# Patient Record
Sex: Male | Born: 1990 | Hispanic: No | Marital: Single | Smoking: Current every day smoker
Health system: Southern US, Community
[De-identification: ages and names within clinical notes are randomized; demographics above are authoritative.]

## PROBLEM LIST (undated history)

## (undated) DIAGNOSIS — F209 Schizophrenia, unspecified: Secondary | ICD-10-CM

## (undated) HISTORY — PX: FACIAL COSMETIC SURGERY: SHX629

---

## 2019-09-15 ENCOUNTER — Other Ambulatory Visit: Payer: Self-pay

## 2019-09-15 ENCOUNTER — Other Ambulatory Visit: Payer: Self-pay | Admitting: *Deleted

## 2019-09-15 ENCOUNTER — Ambulatory Visit: Payer: Self-pay

## 2019-09-15 DIAGNOSIS — B20 Human immunodeficiency virus [HIV] disease: Secondary | ICD-10-CM

## 2019-09-15 DIAGNOSIS — Z79899 Other long term (current) drug therapy: Secondary | ICD-10-CM

## 2019-09-15 DIAGNOSIS — Z113 Encounter for screening for infections with a predominantly sexual mode of transmission: Secondary | ICD-10-CM

## 2019-09-21 ENCOUNTER — Other Ambulatory Visit: Payer: Self-pay

## 2019-09-21 ENCOUNTER — Ambulatory Visit: Payer: Self-pay

## 2019-09-21 ENCOUNTER — Encounter: Payer: Self-pay | Admitting: Infectious Disease

## 2019-09-21 DIAGNOSIS — B20 Human immunodeficiency virus [HIV] disease: Secondary | ICD-10-CM

## 2019-09-21 DIAGNOSIS — Z113 Encounter for screening for infections with a predominantly sexual mode of transmission: Secondary | ICD-10-CM

## 2019-09-21 DIAGNOSIS — Z79899 Other long term (current) drug therapy: Secondary | ICD-10-CM

## 2019-09-22 LAB — URINE CYTOLOGY ANCILLARY ONLY
Chlamydia: NEGATIVE
Comment: NEGATIVE
Comment: NORMAL
Neisseria Gonorrhea: NEGATIVE

## 2019-09-22 LAB — URINALYSIS
Bilirubin Urine: NEGATIVE
Glucose, UA: NEGATIVE
Hgb urine dipstick: NEGATIVE
Ketones, ur: NEGATIVE
Leukocytes,Ua: NEGATIVE
Nitrite: NEGATIVE
Protein, ur: NEGATIVE
Specific Gravity, Urine: 1.024 (ref 1.001–1.03)
pH: 5.5 (ref 5.0–8.0)

## 2019-09-22 LAB — T-HELPER CELL (CD4) - (RCID CLINIC ONLY)
CD4 % Helper T Cell: 36 % (ref 33–65)
CD4 T Cell Abs: 548 /uL (ref 400–1790)

## 2019-10-06 LAB — LIPID PANEL
Cholesterol: 112 mg/dL (ref <200)
HDL: 45 mg/dL (ref >=40)
LDL Cholesterol (Calc): 48 mg/dL (calc)
Non-HDL Cholesterol (Calc): 67 mg/dL (calc) (ref <130)
Total CHOL/HDL Ratio: 2.5 (calc) (ref <5.0)
Triglycerides: 105 mg/dL (ref <150)

## 2019-10-06 LAB — COMPLETE METABOLIC PANEL WITH GFR
AG Ratio: 1 (calc) (ref 1.0–2.5)
ALT: 19 U/L (ref 9–46)
AST: 20 U/L (ref 10–40)
Albumin: 4.3 g/dL (ref 3.6–5.1)
Alkaline phosphatase (APISO): 66 U/L (ref 36–130)
BUN: 17 mg/dL (ref 7–25)
CO2: 27 mmol/L (ref 20–32)
Calcium: 9.7 mg/dL (ref 8.6–10.3)
Chloride: 102 mmol/L (ref 98–110)
Creat: 0.93 mg/dL (ref 0.60–1.35)
GFR, Est African American: 128 mL/min/{1.73_m2} (ref >=60)
GFR, Est Non African American: 111 mL/min/{1.73_m2} (ref >=60)
Globulin: 4.3 g/dL (calc) — ABNORMAL HIGH (ref 1.9–3.7)
Glucose, Bld: 97 mg/dL (ref 65–99)
Potassium: 4 mmol/L (ref 3.5–5.3)
Sodium: 139 mmol/L (ref 135–146)
Total Bilirubin: 0.5 mg/dL (ref 0.2–1.2)
Total Protein: 8.6 g/dL — ABNORMAL HIGH (ref 6.1–8.1)

## 2019-10-06 LAB — CBC WITH DIFFERENTIAL/PLATELET
Absolute Monocytes: 260 cells/uL (ref 200–950)
Basophils Absolute: 20 cells/uL (ref 0–200)
Basophils Relative: 0.4 %
Eosinophils Absolute: 108 cells/uL (ref 15–500)
Eosinophils Relative: 2.2 %
HCT: 42.9 % (ref 38.5–50.0)
Hemoglobin: 14.3 g/dL (ref 13.2–17.1)
Lymphs Abs: 1715 cells/uL (ref 850–3900)
MCH: 29.4 pg (ref 27.0–33.0)
MCHC: 33.3 g/dL (ref 32.0–36.0)
MCV: 88.3 fL (ref 80.0–100.0)
MPV: 10.2 fL (ref 7.5–12.5)
Monocytes Relative: 5.3 %
Neutro Abs: 2798 cells/uL (ref 1500–7800)
Neutrophils Relative %: 57.1 %
Platelets: 218 10*3/uL (ref 140–400)
RBC: 4.86 10*6/uL (ref 4.20–5.80)
RDW: 14.6 % (ref 11.0–15.0)
Total Lymphocyte: 35 %
WBC: 4.9 10*3/uL (ref 3.8–10.8)

## 2019-10-06 LAB — QUANTIFERON-TB GOLD PLUS
Mitogen-NIL: >10 IU/mL
NIL: 0.05 IU/mL
QuantiFERON-TB Gold Plus: NEGATIVE
TB1-NIL: 0.01 IU/mL
TB2-NIL: 0.01 IU/mL

## 2019-10-06 LAB — HIV-1 RNA ULTRAQUANT REFLEX TO GENTYP+
HIV 1 RNA Quant: 286000 copies/mL — ABNORMAL HIGH
HIV-1 RNA Quant, Log: 5.46 Log copies/mL — ABNORMAL HIGH

## 2019-10-06 LAB — HEPATITIS C ANTIBODY
Hepatitis C Ab: NONREACTIVE
SIGNAL TO CUT-OFF: 0.13 (ref <1.00)

## 2019-10-06 LAB — HIV-1/2 AB - DIFFERENTIATION
HIV-1 antibody: POSITIVE — AB
HIV-2 Ab: NEGATIVE

## 2019-10-06 LAB — HEPATITIS A ANTIBODY, TOTAL: Hepatitis A AB,Total: NONREACTIVE

## 2019-10-06 LAB — RPR: RPR Ser Ql: REACTIVE — AB

## 2019-10-06 LAB — HIV ANTIBODY (ROUTINE TESTING W REFLEX): HIV 1&2 Ab, 4th Generation: REACTIVE — AB

## 2019-10-06 LAB — HEPATITIS B SURFACE ANTIBODY,QUALITATIVE: Hep B S Ab: REACTIVE — AB

## 2019-10-06 LAB — HEPATITIS B SURFACE ANTIGEN: Hepatitis B Surface Ag: NONREACTIVE

## 2019-10-06 LAB — HEPATITIS B CORE ANTIBODY, TOTAL: Hep B Core Total Ab: NONREACTIVE

## 2019-10-06 LAB — HLA B*5701: HLA-B*5701 w/rflx HLA-B High: NEGATIVE

## 2019-10-06 LAB — HIV-1 GENOTYPE: HIV-1 Genotype: DETECTED — AB

## 2019-10-06 LAB — FLUORESCENT TREPONEMAL AB(FTA)-IGG-BLD: Fluorescent Treponemal ABS: REACTIVE — AB

## 2019-10-06 LAB — RPR TITER: RPR Titer: 1:32 {titer} — ABNORMAL HIGH

## 2019-10-11 ENCOUNTER — Ambulatory Visit: Payer: Self-pay

## 2019-10-11 ENCOUNTER — Encounter: Payer: Self-pay | Admitting: Infectious Disease

## 2019-10-11 ENCOUNTER — Ambulatory Visit: Payer: Self-pay | Admitting: Pharmacist

## 2019-10-20 ENCOUNTER — Ambulatory Visit: Payer: Self-pay

## 2019-10-20 ENCOUNTER — Ambulatory Visit (INDEPENDENT_AMBULATORY_CARE_PROVIDER_SITE_OTHER): Payer: Self-pay | Admitting: Pharmacist

## 2019-10-20 ENCOUNTER — Encounter: Payer: Self-pay | Admitting: Infectious Disease

## 2019-10-20 ENCOUNTER — Ambulatory Visit (INDEPENDENT_AMBULATORY_CARE_PROVIDER_SITE_OTHER): Payer: Self-pay | Admitting: Infectious Disease

## 2019-10-20 ENCOUNTER — Telehealth: Payer: Self-pay | Admitting: Pharmacy Technician

## 2019-10-20 ENCOUNTER — Other Ambulatory Visit: Payer: Self-pay

## 2019-10-20 VITALS — BP 121/82 | HR 107 | Temp 97.6°F | Ht 67.5 in | Wt 126.0 lb

## 2019-10-20 DIAGNOSIS — B20 Human immunodeficiency virus [HIV] disease: Secondary | ICD-10-CM

## 2019-10-20 DIAGNOSIS — A5149 Other secondary syphilitic conditions: Secondary | ICD-10-CM

## 2019-10-20 DIAGNOSIS — Z23 Encounter for immunization: Secondary | ICD-10-CM

## 2019-10-20 HISTORY — DX: Other secondary syphilitic conditions: A51.49

## 2019-10-20 HISTORY — DX: Human immunodeficiency virus (HIV) disease: B20

## 2019-10-20 MED ORDER — DOVATO 50-300 MG PO TABS: 1.0000 | ORAL_TABLET | Freq: Every day | ORAL | 11 refills | Status: DC

## 2019-10-20 NOTE — Telephone Encounter (Signed)
[facility] Patient Advocate Encounter    Findings of the benefits investigation conducted this morning via test claims for the patient's upcoming appointment on 10/20/2019 are as follows:   Insurance: uninsured, UMAP approved until 12/29/2019  [facility] Patient Advocate will follow up once patient arrives for their appointment if they need any assistance getting their medication.  Beulah Gandy, CPhT Specialty Pharmacy Patient [facility] for Infectious Disease Phone: [phone] Fax: [phone] 10/20/2019 9:20 AM

## 2019-10-20 NOTE — Progress Notes (Signed)
Subjective:    Patient ID: Ethan Goodwin, male    DOB: 1990/10/17, 29 y.o.   MRN: [id number]  Chief complaint: Wants to start on antivirals as soon as possible  HPI  Ethan Goodwin is a 29 year old man with HIV that was diagnosed with HIV through the health department.  He had symptoms this past October around [place] where he had fevers enlarged lymph nodes and malaise for several days.  This sounds like when he seroconverted to having HIV.  He tested negative for HIV 1 year ago.  Labs done at his intake here at our [facility] showed him to have a viral load over 200,000 but is wild-type without resistance.  He has immunity to hepatitis B he does not have hepatitis B infection and his HLA B5 701 -.  He also was found to have syphilis with a titer of 1-32 and claims to been treated at the health department.  He was actually scheduled to see me at 2 PM today but showed up at 11:50 asking if he could be seen.  I was agreeable to seeing him based on his eagerness to start on antiretrovirals.  We discussed all the current first-line antiretroviral regimens based on DHHS guidelines.  We opted to go with Dovato.  I had samples from the [facility] and gave him a 28-day supply and he took his first dose of Dovato in clinic today.  I have also sent prescription to the specialty [facility] in [place] so that his antiretrovirals can began being shipped to him.  He does need to renew his HIV medication assistance program and hopefully that will be done today or very soon.    Past Medical History:  Diagnosis Date  . HIV disease ([place]) 10/20/2019  . 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 Marital status: Single    Spouse name: Not on file  . Number of children: Not on file  . Years of education: Not on file  . Highest education level: Not on file  Occupational History  . Not on file  Tobacco Use  . Smoking status: Not on file  Substance and Sexual Activity  .  Alcohol use: Not on file  . Drug use: Not on file  . Sexual activity: Not on file  Other Topics Concern  . Not on file  Social History Narrative  . Not on file   Social Determinants of Health   Financial Resource Strain:   . Difficulty of Paying Living Expenses: Not on file  Food Insecurity:   . Worried About Charity fundraiser in the Last Year: Not on file  . Ran Out of Food in the Last Year: Not on file  Transportation Needs:   . Lack of Transportation (Medical): Not on file  . Lack of Transportation (Non-Medical): Not on file  Physical Activity:   . Days of Exercise per Week: Not on file  . Minutes of Exercise per Session: Not on file  Stress:   . Feeling of Stress : Not on file  Social Connections:   . Frequency of Communication with Friends and Family: Not on file  . Frequency of Social Gatherings with Friends and Family: Not on file  . Attends Religious Services: Not on file  . Active Member of Clubs or Organizations: Not on file  . Attends Archivist Meetings: Not on file  . Marital Status: Not on file    No Known Allergies  Current Outpatient Medications:  .  Dolutegravir-lamiVUDine (DOVATO) 50-300 MG TABS, Take 1 tablet by mouth daily., Disp: 30 tablet, Rfl: 11    Review of Systems  Constitutional: Negative for chills and fever.  HENT: Negative for congestion and sore throat.   Eyes: Negative for photophobia.  Respiratory: Negative for cough, shortness of breath and wheezing.   Cardiovascular: Negative for chest pain, palpitations and leg swelling.  Gastrointestinal: Negative for abdominal pain, blood in stool, constipation, diarrhea, nausea and vomiting.  Genitourinary: Negative for dysuria, flank pain and hematuria.  Musculoskeletal: Negative for back pain and myalgias.  Skin: Negative for rash.  Neurological: Negative for dizziness, weakness and headaches.  Hematological: Does not bruise/bleed easily.  Psychiatric/Behavioral: Negative for  agitation, behavioral problems, decreased concentration, dysphoric mood, hallucinations and suicidal ideas. The patient is nervous/anxious.        Objective:   Physical Exam Constitutional:      General: He is not in acute distress.    Appearance: Normal appearance. He is well-developed. He is not ill-appearing or diaphoretic.  HENT:     Head: Normocephalic and atraumatic.     Right Ear: Hearing and external ear normal.     Left Ear: Hearing and external ear normal.     Nose: No nasal deformity or rhinorrhea.  Eyes:     General: No scleral icterus.    Conjunctiva/sclera: Conjunctivae normal.     Right eye: Right conjunctiva is not injected.     Left eye: Left conjunctiva is not injected.     Pupils: Pupils are equal, round, and reactive to light.  Neck:     Vascular: No JVD.  Cardiovascular:     Rate and Rhythm: Normal rate and regular rhythm.     Heart sounds: S1 normal and S2 normal.  Pulmonary:     Effort: Pulmonary effort is normal. No respiratory distress.  Abdominal:     General: There is no distension.     Palpations: Abdomen is soft.  Musculoskeletal:        General: Normal range of motion.     Right shoulder: Normal.     Left shoulder: Normal.     Cervical back: Normal range of motion and neck supple.     Right hip: Normal.     Left hip: Normal.     Right knee: Normal.     Left knee: Normal.  Lymphadenopathy:     Head:     Right side of head: No submandibular, preauricular or posterior auricular adenopathy.     Left side of head: No submandibular, preauricular or posterior auricular adenopathy.     Cervical: No cervical adenopathy.     Right cervical: No superficial or deep cervical adenopathy.    Left cervical: No superficial or deep cervical adenopathy.  Skin:    General: Skin is warm and dry.     Coloration: Skin is not pale.     Findings: No abrasion, bruising, ecchymosis, erythema, lesion or rash.     Nails: There is no clubbing.  Neurological:      General: No focal deficit present.     Mental Status: He is alert and oriented to person, place, and time.     Sensory: No sensory deficit.     Coordination: Coordination normal.     Gait: Gait normal.  Psychiatric:        Attention and Perception: Attention and perception normal. He is attentive.        Mood and Affect: Mood is  anxious.        Speech: Speech normal.        Behavior: Behavior normal. Behavior is cooperative.        Thought Content: Thought content normal.        Cognition and Memory: Cognition and memory normal.        Judgment: Judgment normal.           Assessment & Plan:   HIV disease: He seems to have recently acquired the infection based on his symptomatology in October and his high viral load but healthy CD4 count when checked in December.  We have started Dovato today in clinic and meds [facility].  He does need to renew his HIV medication assistance program.  We will have him come back in 4 weeks time for repeat labs and then see me in roughly 6 weeks.  He has been vaccinated for influenza and pneumonia today with a Prevnar shot  We will start meningococcal vaccination at his next visit if he has not had this before.  Who also for his knee Pneumovax down the road.  Syphilis: Status post treatment held department will follow titers.

## 2019-10-22 NOTE — Progress Notes (Signed)
Patient left before being seen by pharmacy. Will follow up with patient at his next visit in March.

## 2019-11-10 ENCOUNTER — Other Ambulatory Visit: Payer: Self-pay

## 2019-11-10 NOTE — Telephone Encounter (Signed)
Entered in error

## 2019-11-17 ENCOUNTER — Other Ambulatory Visit: Payer: Self-pay

## 2019-11-22 ENCOUNTER — Other Ambulatory Visit: Payer: Self-pay

## 2019-11-22 DIAGNOSIS — B20 Human immunodeficiency virus [HIV] disease: Secondary | ICD-10-CM

## 2019-11-23 ENCOUNTER — Telehealth: Payer: Self-pay

## 2019-11-23 LAB — T-HELPER CELL (CD4) - (RCID CLINIC ONLY)
CD4 % Helper T Cell: 35 % (ref 33–65)
CD4 T Cell Abs: 574 /uL (ref 400–1790)

## 2019-11-23 NOTE — Telephone Encounter (Signed)
Received call from [place] with [facility] inquiring about patient follow up for newly diagnosed HIV. Informed Barbara Cower that the patient did not show for his lab appointment on 2/17 but does have an appointment scheduled with Dr. Daiva Eves on 12/01/19. Provided Barbara Cower the patient's phone number as the number he had was not the same as the one on file.   Harris Penton Loyola Mast, RN

## 2019-11-25 ENCOUNTER — Encounter: Payer: Self-pay | Admitting: Infectious Disease

## 2019-11-25 LAB — CBC WITH DIFFERENTIAL/PLATELET
Absolute Monocytes: 291 cells/uL (ref 200–950)
Basophils Absolute: 21 cells/uL (ref 0–200)
Basophils Relative: 0.5 %
Eosinophils Absolute: 21 cells/uL (ref 15–500)
Eosinophils Relative: 0.5 %
HCT: 44.7 % (ref 38.5–50.0)
Hemoglobin: 14.9 g/dL (ref 13.2–17.1)
Lymphs Abs: 1714 cells/uL (ref 850–3900)
MCH: 30.3 pg (ref 27.0–33.0)
MCHC: 33.3 g/dL (ref 32.0–36.0)
MCV: 90.9 fL (ref 80.0–100.0)
MPV: 10.2 fL (ref 7.5–12.5)
Monocytes Relative: 7.1 %
Neutro Abs: 2054 cells/uL (ref 1500–7800)
Neutrophils Relative %: 50.1 %
Platelets: 168 10*3/uL (ref 140–400)
RBC: 4.92 10*6/uL (ref 4.20–5.80)
RDW: 14.9 % (ref 11.0–15.0)
Total Lymphocyte: 41.8 %
WBC: 4.1 10*3/uL (ref 3.8–10.8)

## 2019-11-25 LAB — COMPLETE METABOLIC PANEL WITH GFR
AG Ratio: 1.2 (calc) (ref 1.0–2.5)
ALT: 23 U/L (ref 9–46)
AST: 24 U/L (ref 10–40)
Albumin: 4.5 g/dL (ref 3.6–5.1)
Alkaline phosphatase (APISO): 76 U/L (ref 36–130)
BUN: 17 mg/dL (ref 7–25)
CO2: 30 mmol/L (ref 20–32)
Calcium: 9.5 mg/dL (ref 8.6–10.3)
Chloride: 102 mmol/L (ref 98–110)
Creat: 1.01 mg/dL (ref 0.60–1.35)
GFR, Est African American: 115 mL/min/{1.73_m2} (ref >=60)
GFR, Est Non African American: 99 mL/min/{1.73_m2} (ref >=60)
Globulin: 3.9 g/dL (calc) — ABNORMAL HIGH (ref 1.9–3.7)
Glucose, Bld: 98 mg/dL (ref 65–99)
Potassium: 4.2 mmol/L (ref 3.5–5.3)
Sodium: 137 mmol/L (ref 135–146)
Total Bilirubin: 0.6 mg/dL (ref 0.2–1.2)
Total Protein: 8.4 g/dL — ABNORMAL HIGH (ref 6.1–8.1)

## 2019-11-25 LAB — HIV-1 RNA QUANT-NO REFLEX-BLD
HIV 1 RNA Quant: 16300 copies/mL — ABNORMAL HIGH
HIV-1 RNA Quant, Log: 4.21 Log copies/mL — ABNORMAL HIGH

## 2019-12-01 ENCOUNTER — Encounter: Payer: Self-pay | Admitting: Infectious Disease

## 2020-01-11 ENCOUNTER — Ambulatory Visit: Payer: Self-pay | Admitting: Infectious Disease

## 2020-01-12 ENCOUNTER — Ambulatory Visit: Payer: Self-pay | Admitting: Infectious Disease

## 2020-03-23 ENCOUNTER — Emergency Department (HOSPITAL_COMMUNITY)
Admission: EM | Admit: 2020-03-23 | Discharge: 2020-03-23 | Disposition: A | Discharge status: Home / Self Care | Payer: Self-pay | Attending: Emergency Medicine | Admitting: Emergency Medicine

## 2020-03-23 ENCOUNTER — Encounter (HOSPITAL_COMMUNITY): Payer: Self-pay

## 2020-03-23 ENCOUNTER — Other Ambulatory Visit: Payer: Self-pay

## 2020-03-23 DIAGNOSIS — L03113 Cellulitis of right upper limb: Secondary | ICD-10-CM | POA: Insufficient documentation

## 2020-03-23 DIAGNOSIS — L03119 Cellulitis of unspecified part of limb: Secondary | ICD-10-CM

## 2020-03-23 DIAGNOSIS — Z79899 Other long term (current) drug therapy: Secondary | ICD-10-CM | POA: Insufficient documentation

## 2020-03-23 DIAGNOSIS — R000 Tachycardia, unspecified: Secondary | ICD-10-CM | POA: Insufficient documentation

## 2020-03-23 DIAGNOSIS — F17200 Nicotine dependence, unspecified, uncomplicated: Secondary | ICD-10-CM | POA: Insufficient documentation

## 2020-03-23 DIAGNOSIS — Z21 Asymptomatic human immunodeficiency virus [HIV] infection status: Secondary | ICD-10-CM | POA: Insufficient documentation

## 2020-03-23 MED ORDER — LIDOCAINE HCL (PF) 1 % IJ SOLN
INTRAMUSCULAR | Status: AC
  Administered 2020-03-23: 2 mL via INTRAMUSCULAR
  Filled 2020-03-23: qty 30

## 2020-03-23 MED ORDER — CEFTRIAXONE SODIUM 1 G IJ SOLR
1.0000 g | Freq: Once | INTRAMUSCULAR | Status: AC
  Administered 2020-03-23: 1 g via INTRAMUSCULAR
  Filled 2020-03-23: qty 10

## 2020-03-23 MED ORDER — CEPHALEXIN 500 MG PO CAPS: 500.0000 mg | ORAL_CAPSULE | Freq: Four times a day (QID) | ORAL | 0 refills | Status: DC

## 2020-03-23 NOTE — Discharge Instructions (Signed)
I think you are starting to develop a skin infection of your R hand. You are being started on antibiotics. If you feel like symptoms are worsening or do not begin to improve in 24-36 hours then I want you to be rechecked. Take 600 mg of ibuprofen every 6 hours as needed for pain.

## 2020-03-23 NOTE — ED Notes (Signed)
Able to obtain only 1 set of blood cultures, Dr Juleen China aware

## 2020-03-23 NOTE — ED Triage Notes (Signed)
Patient complaining of generalized body aches and cramps, has been outside all day, Admits to using meth two hours ago, Tachycardic in the 130's

## 2020-03-25 NOTE — ED Provider Notes (Signed)
[facility]-EMERGENCY DEPT Provider Note   CSN: [id number] Arrival date & time: 03/23/20  1932     History Chief Complaint  Patient presents with  . Generalized Body Aches  . Dehydration    Ethan Goodwin is a 29 y.o. male.  HPI   29 year old male with pain and swelling of his right hand.  Onset today.  Denies any specific trauma but he does use IV drugs including injecting in his hands.  Admits to methamphetamine use earlier today.  Some body aches.  Denies any fever.  Feels tired.  Past Medical History:  Diagnosis Date  . HIV disease (H[facility]) 10/20/2019  . 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 Acute HIV infection syndrome (H[facility]) 10/20/2019  . 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     No family history on file.  Social History   Tobacco Use  . Smoking status: Current Some Day Smoker  . Smokeless tobacco: Never Used  Substance Use Topics  . Alcohol use: Not on file  . Drug use: Yes    Types: IV, Methamphetamines    Home Medications Prior to Admission medications   Medication Sig Start Date End Date Taking? Authorizing Provider  cephALEXin (KEFLEX) 500 MG capsule Take 1 capsule (500 mg total) by mouth 4 (four) times daily. 03/23/20   Raeford Razor, MD  Dolutegravir-lamiVUDine (DOVATO) 50-300 MG TABS Take 1 tablet by mouth daily. 10/20/19   Randall Hiss, MD    Allergies    Patient has no known allergies.  Review of Systems   Review of Systems All systems reviewed and negative, other than as noted in HPI.  Physical Exam Updated Vital Signs BP 103/61   Pulse (!) 132   Resp 15   Ht 5\' 7"  (1.702 m)   Wt 56.7 kg   SpO2 100%   BMI 19.58 kg/m   Physical Exam Vitals and nursing note reviewed.  Constitutional:      General: He is not in acute distress.    Appearance: He is well-developed.  HENT:     Head: Normocephalic and atraumatic.  Eyes:      General:        Right eye: No discharge.        Left eye: No discharge.     Conjunctiva/sclera: Conjunctivae normal.  Cardiovascular:     Rate and Rhythm: Regular rhythm. Tachycardia present.     Heart sounds: Normal heart sounds. No murmur heard.  No friction rub. No gallop.   Pulmonary:     Effort: Pulmonary effort is normal. No respiratory distress.     Breath sounds: Normal breath sounds.  Abdominal:     General: There is no distension.     Palpations: Abdomen is soft.     Tenderness: There is no abdominal tenderness.  Musculoskeletal:     Cervical back: Neck supple.     Comments: Faint erythema and mild swelling over the dorsal aspect of the right hand.  He is able to actively range at the wrist and all MP joints without any obvious discomfort.  No obvious abscess.  Skin:    General: Skin is warm and dry.  Neurological:     Mental Status: He is alert.  Psychiatric:        Behavior: Behavior normal.        Thought Content: Thought content normal.     ED Results /  Procedures / Treatments   Labs (all labs ordered are listed, but only abnormal results are displayed) Labs Reviewed  CULTURE, BLOOD (ROUTINE X 2)  CULTURE, BLOOD (ROUTINE X 2)    EKG None  Radiology No results found.  Procedures Procedures (including critical care time)  Medications Ordered in ED Medications  cefTRIAXone (ROCEPHIN) injection 1 g (1 g Intramuscular Given 03/23/20 2125)  lidocaine (PF) (XYLOCAINE) 1 % injection (2 mLs Intramuscular Given 03/23/20 2125)    ED Course  I have reviewed the triage vital signs and the nursing notes.  Pertinent labs & imaging results that were available during my care of the patient were reviewed by me and considered in my medical decision making (see chart for details).    MDM Rules/Calculators/A&P                         29 year old male with what I suspect is an early cellulitis of the right hand.  Given his history of IV drug use, will obtain blood  cultures.  Nothing to suggest a septic arthritis currently.  He is tachycardic but he generally looks well and he admits to methamphetamine usage earlier today.  Advised to drink plenty water.  Will place on Keflex.  He needs repeat evaluation of his hand if symptoms worsen or do not begin to improve in the next 24 to 36 hours.  Final Clinical Impression(s) / ED Diagnoses Final diagnoses:  Cellulitis of hand    Rx / DC Orders ED Discharge Orders         Ordered    cephALEXin (KEFLEX) 500 MG capsule  4 times daily     Discontinue  Reprint     03/23/20 2036           Virgel Manifold, MD 03/25/20 1625

## 2020-03-26 ENCOUNTER — Telehealth (HOSPITAL_BASED_OUTPATIENT_CLINIC_OR_DEPARTMENT_OTHER): Payer: Self-pay | Admitting: Emergency Medicine

## 2020-03-26 LAB — BLOOD CULTURE ID PANEL (REFLEXED)

## 2020-03-27 LAB — CULTURE, BLOOD (ROUTINE X 2): Special Requests: ADEQUATE

## 2020-03-28 ENCOUNTER — Telehealth: Payer: Self-pay | Admitting: Emergency Medicine

## 2020-03-28 NOTE — Progress Notes (Addendum)
ED Antimicrobial Stewardship Positive Culture Follow Up   Ethan Goodwin is an 29 y.o. male who presented to [facility] on 03/23/2020 with a chief complaint of generalized body aches, dehydration, pain and swelling of his right hand. Patient uses IV drugs, including injecting in his hands.    Recent Results (from the past 720 hour(s))  Blood culture (routine x 2)     Status: Abnormal   Collection Time: 03/23/20  8:35 PM   Specimen: BLOOD LEFT HAND  Result Value Ref Range Status   Specimen Description   Final    BLOOD LEFT HAND Performed at [facility], [address] [place], [place] [address]    Special Requests   Final    BOTTLES DRAWN AEROBIC AND ANAEROBIC Blood Culture adequate volume Performed at [facility], [address] [place], [place] [address]    Culture  Setup Time   Final    GRAM NEGATIVE RODS AEROBIC BOTTLE ONLY CRITICAL RESULT CALLED TO, READ BACK BY AND VERIFIED WITH: CRN K CRANSTON @0026  03/26/20 BY S GEZAHEGN Performed at [facility], [address] [place], [place]    Culture ENTEROBACTER CLOACAE (A)  Final   Report Status 03/27/2020 FINAL  Final   Organism ID, Bacteria ENTEROBACTER CLOACAE  Final      Susceptibility   Enterobacter cloacae - MIC*    CEFAZOLIN >=64 RESISTANT Resistant     CEFEPIME <=0.12 SENSITIVE Sensitive     CEFTAZIDIME <=1 SENSITIVE Sensitive     CIPROFLOXACIN <=0.25 SENSITIVE Sensitive     GENTAMICIN <=1 SENSITIVE Sensitive     IMIPENEM <=0.25 SENSITIVE Sensitive     TRIMETH/SULFA <=20 SENSITIVE Sensitive     PIP/TAZO <=4 SENSITIVE Sensitive     * ENTEROBACTER CLOACAE  Blood Culture ID Panel (Reflexed)     Status: Abnormal   Collection Time: 03/23/20  8:35 PM  Result Value Ref Range Status   Enterococcus species NOT DETECTED NOT DETECTED Final   Listeria monocytogenes NOT DETECTED NOT DETECTED Final   Staphylococcus species NOT DETECTED NOT DETECTED Final   Staphylococcus aureus (BCID)  NOT DETECTED NOT DETECTED Final   Streptococcus species NOT DETECTED NOT DETECTED Final   Streptococcus agalactiae NOT DETECTED NOT DETECTED Final   Streptococcus pneumoniae NOT DETECTED NOT DETECTED Final   Streptococcus pyogenes NOT DETECTED NOT DETECTED Final   Acinetobacter baumannii NOT DETECTED NOT DETECTED Final   Enterobacteriaceae species DETECTED (A) NOT DETECTED Final    Comment: Enterobacteriaceae represent a large family of gram-negative bacteria, not a single organism. CRITICAL RESULT CALLED TO, READ BACK BY AND VERIFIED WITH: CRN K CRANSTON @0026  03/26/20 BY S GEZAHEGN    Enterobacter cloacae complex DETECTED (A) NOT DETECTED Final    Comment: CRITICAL RESULT CALLED TO, READ BACK BY AND VERIFIED WITH: CRN K CRANSTON @0026  03/26/20 BY S GEZAHEGN    Escherichia coli NOT DETECTED NOT DETECTED Final   Klebsiella oxytoca NOT DETECTED NOT DETECTED Final   Klebsiella pneumoniae NOT DETECTED NOT DETECTED Final   Proteus species NOT DETECTED NOT DETECTED Final   Serratia marcescens NOT DETECTED NOT DETECTED Final   Carbapenem resistance NOT DETECTED NOT DETECTED Final   Haemophilus influenzae NOT DETECTED NOT DETECTED Final   Neisseria meningitidis NOT DETECTED NOT DETECTED Final   Pseudomonas aeruginosa NOT DETECTED NOT DETECTED Final   Candida albicans NOT DETECTED NOT DETECTED Final   Candida glabrata NOT DETECTED NOT DETECTED Final   Candida krusei NOT DETECTED NOT DETECTED  Final   Candida parapsilosis NOT DETECTED NOT DETECTED Final   Candida tropicalis NOT DETECTED NOT DETECTED Final    Comment: Performed at [facility], [address] [place], [place] [address]    Discharged with prescription for Cephalexin 500mg  BID x 7 days for cellulitis of right hand-organism resistant to prescribed antimicrobial. Blood culture came back positive for Enterobacter cloacae, so now requires IV antibiotic therapy. Per notes, RN at attempted to contact patient  without success on 03/26/2020 for admission to hospital for treatment of bacteremia at request of Dr. 03/28/2020.   Plan: Attempt to contact patient again for admission to hospital to treat with IV antibiotic therapy per Dr. Eudelia Bunch. Discussed plan with Patient Placement RN for follow-up.  ED Provider: Lynelle Doctor, MD   Linwood Dibbles M 03/28/2020, 11:31 AM Clinical Pharmacist [phone]

## 2020-03-28 NOTE — Telephone Encounter (Signed)
Post ED Visit - Positive Culture Follow-up: Successful Patient Follow-Up  Culture assessed and recommendations reviewed by:  []  , Pharm.D. []  Enzo Bi, Pharm.D., BCPS AQ-ID []  , Pharm.D., BCPS []  Celedonio Miyamoto, .D., BCPS []  [place], .D., BCPS, AAHIVP []  Georgina Pillion, Pharm.D., BCPS, AAHIVP []  [address], PharmD, BCPS []  , PharmD, BCPS []  [place], PharmD, BCPS []  [address], PharmD  Positive blood culture  []  Patient discharged without antimicrobial prescription and treatment is now indicated []  Organism is resistant to prescribed ED discharge antimicrobial [x]  Patient with positive blood cultures  Changes discussed with ED provider: MD Patient needs to return to ED for admit for IV antibiotics  Attempting to contact patient   Ethan Goodwin 03/28/2020, 11:56 AM

## 2020-04-09 ENCOUNTER — Other Ambulatory Visit: Payer: Self-pay

## 2020-04-09 ENCOUNTER — Encounter (HOSPITAL_BASED_OUTPATIENT_CLINIC_OR_DEPARTMENT_OTHER): Payer: Self-pay | Admitting: Emergency Medicine

## 2020-04-09 DIAGNOSIS — Z8619 Personal history of other infectious and parasitic diseases: Secondary | ICD-10-CM

## 2020-04-09 DIAGNOSIS — E876 Hypokalemia: Secondary | ICD-10-CM | POA: Diagnosis present

## 2020-04-09 DIAGNOSIS — Z21 Asymptomatic human immunodeficiency virus [HIV] infection status: Secondary | ICD-10-CM | POA: Diagnosis present

## 2020-04-09 DIAGNOSIS — R7881 Bacteremia: Secondary | ICD-10-CM | POA: Diagnosis present

## 2020-04-09 DIAGNOSIS — Z20822 Contact with and (suspected) exposure to covid-19: Secondary | ICD-10-CM | POA: Diagnosis present

## 2020-04-09 DIAGNOSIS — F1590 Other stimulant use, unspecified, uncomplicated: Secondary | ICD-10-CM | POA: Diagnosis present

## 2020-04-09 DIAGNOSIS — Z590 Homelessness: Secondary | ICD-10-CM

## 2020-04-09 DIAGNOSIS — B9689 Other specified bacterial agents as the cause of diseases classified elsewhere: Secondary | ICD-10-CM | POA: Diagnosis present

## 2020-04-09 DIAGNOSIS — F17210 Nicotine dependence, cigarettes, uncomplicated: Secondary | ICD-10-CM | POA: Diagnosis present

## 2020-04-09 DIAGNOSIS — M65131 Other infective (teno)synovitis, right wrist: Secondary | ICD-10-CM | POA: Diagnosis present

## 2020-04-09 DIAGNOSIS — L03113 Cellulitis of right upper limb: Principal | ICD-10-CM | POA: Diagnosis present

## 2020-04-09 DIAGNOSIS — Z79899 Other long term (current) drug therapy: Secondary | ICD-10-CM

## 2020-04-09 NOTE — ED Triage Notes (Signed)
Pt reports he has been called back to ED for positive blood cultures. States homeless and not taking care of himself.

## 2020-04-09 NOTE — ED Notes (Signed)
No answer x 1 for triage

## 2020-04-10 ENCOUNTER — Inpatient Hospital Stay (HOSPITAL_BASED_OUTPATIENT_CLINIC_OR_DEPARTMENT_OTHER)
Admission: EM | Admit: 2020-04-10 | Discharge: 2020-04-12 | DRG: 603 | Disposition: A | Discharge status: Home / Self Care | Payer: Self-pay | Attending: Internal Medicine | Admitting: Internal Medicine

## 2020-04-10 ENCOUNTER — Inpatient Hospital Stay (HOSPITAL_COMMUNITY): Payer: Self-pay

## 2020-04-10 ENCOUNTER — Emergency Department (HOSPITAL_BASED_OUTPATIENT_CLINIC_OR_DEPARTMENT_OTHER): Payer: Self-pay

## 2020-04-10 DIAGNOSIS — F1990 Other psychoactive substance use, unspecified, uncomplicated: Secondary | ICD-10-CM

## 2020-04-10 DIAGNOSIS — B20 Human immunodeficiency virus [HIV] disease: Secondary | ICD-10-CM

## 2020-04-10 DIAGNOSIS — R7881 Bacteremia: Secondary | ICD-10-CM

## 2020-04-10 DIAGNOSIS — L03113 Cellulitis of right upper limb: Principal | ICD-10-CM

## 2020-04-10 DIAGNOSIS — B9689 Other specified bacterial agents as the cause of diseases classified elsewhere: Secondary | ICD-10-CM

## 2020-04-10 DIAGNOSIS — Z8619 Personal history of other infectious and parasitic diseases: Secondary | ICD-10-CM

## 2020-04-10 LAB — CBC WITH DIFFERENTIAL/PLATELET
Abs Immature Granulocytes: 0.04 10*3/uL (ref 0.00–0.07)
Basophils Absolute: 0 10*3/uL (ref 0.0–0.1)
Basophils Relative: 0 %
Eosinophils Absolute: 0.1 10*3/uL (ref 0.0–0.5)
Eosinophils Relative: 1 %
HCT: 38.2 % — ABNORMAL LOW (ref 39.0–52.0)
Hemoglobin: 12.8 g/dL — ABNORMAL LOW (ref 13.0–17.0)
Immature Granulocytes: 0 %
Lymphocytes Relative: 25 %
Lymphs Abs: 2.3 10*3/uL (ref 0.7–4.0)
MCH: 32.7 pg (ref 26.0–34.0)
MCHC: 33.5 g/dL (ref 30.0–36.0)
MCV: 97.7 fL (ref 80.0–100.0)
Monocytes Absolute: 0.8 10*3/uL (ref 0.1–1.0)
Monocytes Relative: 9 %
Neutro Abs: 5.9 10*3/uL (ref 1.7–7.7)
Neutrophils Relative %: 65 %
Platelets: 274 10*3/uL (ref 150–400)
RBC: 3.91 MIL/uL — ABNORMAL LOW (ref 4.22–5.81)
RDW: 13 % (ref 11.5–15.5)
WBC: 9.2 10*3/uL (ref 4.0–10.5)
nRBC: 0 % (ref 0.0–0.2)

## 2020-04-10 LAB — PROTIME-INR
INR: 1 (ref 0.8–1.2)
Prothrombin Time: 12.8 seconds (ref 11.4–15.2)

## 2020-04-10 LAB — COMPREHENSIVE METABOLIC PANEL
ALT: 19 U/L (ref 0–44)
AST: 20 U/L (ref 15–41)
Albumin: 3.9 g/dL (ref 3.5–5.0)
Alkaline Phosphatase: 75 U/L (ref 38–126)
Anion gap: 9 (ref 5–15)
BUN: 12 mg/dL (ref 6–20)
CO2: 24 mmol/L (ref 22–32)
Calcium: 8.9 mg/dL (ref 8.9–10.3)
Chloride: 103 mmol/L (ref 98–111)
Creatinine, Ser: 0.98 mg/dL (ref 0.61–1.24)
GFR calc Af Amer: >60 mL/min (ref >60)
GFR calc non Af Amer: >60 mL/min (ref >60)
Glucose, Bld: 107 mg/dL — ABNORMAL HIGH (ref 70–99)
Potassium: 3.4 mmol/L — ABNORMAL LOW (ref 3.5–5.1)
Sodium: 136 mmol/L (ref 135–145)
Total Bilirubin: 0.9 mg/dL (ref 0.3–1.2)
Total Protein: 7.8 g/dL (ref 6.5–8.1)

## 2020-04-10 LAB — CBC
HCT: 39.8 % (ref 39.0–52.0)
Hemoglobin: 13.4 g/dL (ref 13.0–17.0)
MCH: 33.5 pg (ref 26.0–34.0)
MCHC: 33.7 g/dL (ref 30.0–36.0)
MCV: 99.5 fL (ref 80.0–100.0)
Platelets: 250 10*3/uL (ref 150–400)
RBC: 4 MIL/uL — ABNORMAL LOW (ref 4.22–5.81)
RDW: 13 % (ref 11.5–15.5)
WBC: 7.7 10*3/uL (ref 4.0–10.5)
nRBC: 0 % (ref 0.0–0.2)

## 2020-04-10 LAB — URINALYSIS, ROUTINE W REFLEX MICROSCOPIC
Bilirubin Urine: NEGATIVE
Glucose, UA: NEGATIVE mg/dL
Hgb urine dipstick: NEGATIVE
Ketones, ur: NEGATIVE mg/dL
Leukocytes,Ua: NEGATIVE
Nitrite: NEGATIVE
Protein, ur: NEGATIVE mg/dL
Specific Gravity, Urine: 1.025 (ref 1.005–1.030)
pH: 6.5 (ref 5.0–8.0)

## 2020-04-10 LAB — SEDIMENTATION RATE: Sed Rate: 26 mm/hr — ABNORMAL HIGH (ref 0–16)

## 2020-04-10 LAB — CREATININE, SERUM
Creatinine, Ser: 0.86 mg/dL (ref 0.61–1.24)
GFR calc Af Amer: >60 mL/min (ref >60)
GFR calc non Af Amer: >60 mL/min (ref >60)

## 2020-04-10 LAB — LACTIC ACID, PLASMA: Lactic Acid, Venous: 0.8 mmol/L (ref 0.5–1.9)

## 2020-04-10 LAB — HEPATITIS C ANTIBODY: HCV Ab: NONREACTIVE

## 2020-04-10 LAB — APTT: aPTT: 31 seconds (ref 24–36)

## 2020-04-10 LAB — C-REACTIVE PROTEIN: CRP: 5.3 mg/dL — ABNORMAL HIGH (ref <1.0)

## 2020-04-10 LAB — SARS CORONAVIRUS 2 BY RT PCR (HOSPITAL ORDER, PERFORMED IN ~~LOC~~ HOSPITAL LAB): SARS Coronavirus 2: NEGATIVE

## 2020-04-10 MED ORDER — SODIUM CHLORIDE 0.9 % IV SOLN
2.0000 g | Freq: Three times a day (TID) | INTRAVENOUS | Status: DC
  Administered 2020-04-10 – 2020-04-12 (×6): 2 g via INTRAVENOUS
  Filled 2020-04-10 (×7): qty 2

## 2020-04-10 MED ORDER — SODIUM CHLORIDE 0.9 % IV SOLN
1.0000 g | Freq: Once | INTRAVENOUS | Status: AC
  Administered 2020-04-10: 1 g via INTRAVENOUS
  Filled 2020-04-10: qty 1

## 2020-04-10 MED ORDER — SODIUM CHLORIDE 0.9 % IV SOLN
INTRAVENOUS | Status: AC
  Filled 2020-04-10: qty 2

## 2020-04-10 MED ORDER — VANCOMYCIN HCL IN DEXTROSE 1-5 GM/200ML-% IV SOLN
1000.0000 mg | Freq: Two times a day (BID) | INTRAVENOUS | Status: DC
  Administered 2020-04-10 – 2020-04-12 (×4): 1000 mg via INTRAVENOUS
  Filled 2020-04-10 (×4): qty 200

## 2020-04-10 MED ORDER — OXYCODONE HCL 5 MG PO TABS: 5.0000 mg | ORAL_TABLET | ORAL | Status: DC | PRN

## 2020-04-10 MED ORDER — ENOXAPARIN SODIUM 40 MG/0.4ML ~~LOC~~ SOLN
40.0000 mg | SUBCUTANEOUS | Status: DC
  Administered 2020-04-10 – 2020-04-11 (×2): 40 mg via SUBCUTANEOUS
  Filled 2020-04-10 (×2): qty 0.4

## 2020-04-10 MED ORDER — GADOBUTROL 1 MMOL/ML IV SOLN
6.0000 mL | Freq: Once | INTRAVENOUS | Status: AC | PRN
  Administered 2020-04-10: 6 mL via INTRAVENOUS

## 2020-04-10 MED ORDER — KETOROLAC TROMETHAMINE 15 MG/ML IJ SOLN: 15.0000 mg | Freq: Four times a day (QID) | INTRAMUSCULAR | Status: DC | PRN

## 2020-04-10 MED ORDER — DOLUTEGRAVIR-LAMIVUDINE 50-300 MG PO TABS: 1.0000 | ORAL_TABLET | Freq: Every day | ORAL | Status: DC

## 2020-04-10 MED ORDER — ACETAMINOPHEN 325 MG PO TABS: 650.0000 mg | ORAL_TABLET | Freq: Four times a day (QID) | ORAL | Status: DC | PRN

## 2020-04-10 MED ORDER — ACETAMINOPHEN 650 MG RE SUPP: 650.0000 mg | Freq: Four times a day (QID) | RECTAL | Status: DC | PRN

## 2020-04-10 MED ORDER — POLYETHYLENE GLYCOL 3350 17 G PO PACK: 17.0000 g | PACK | Freq: Every day | ORAL | Status: DC | PRN

## 2020-04-10 MED ORDER — DOLUTEGRAVIR SODIUM 50 MG PO TABS
50.0000 mg | ORAL_TABLET | Freq: Every day | ORAL | Status: DC
  Administered 2020-04-10 – 2020-04-12 (×3): 50 mg via ORAL
  Filled 2020-04-10 (×3): qty 1

## 2020-04-10 MED ORDER — LAMIVUDINE 150 MG PO TABS
300.0000 mg | ORAL_TABLET | Freq: Every day | ORAL | Status: DC
  Administered 2020-04-10 – 2020-04-12 (×3): 300 mg via ORAL
  Filled 2020-04-10 (×3): qty 2

## 2020-04-10 MED ORDER — VANCOMYCIN HCL IN DEXTROSE 1-5 GM/200ML-% IV SOLN
1000.0000 mg | Freq: Once | INTRAVENOUS | Status: AC
  Administered 2020-04-10: 1000 mg via INTRAVENOUS
  Filled 2020-04-10: qty 200

## 2020-04-10 MED ORDER — NICOTINE POLACRILEX 2 MG MT GUM
2.0000 mg | CHEWING_GUM | OROMUCOSAL | Status: DC | PRN
  Filled 2020-04-10: qty 1

## 2020-04-10 MED ORDER — NICOTINE 14 MG/24HR TD PT24: 14.0000 mg | MEDICATED_PATCH | Freq: Every day | TRANSDERMAL | Status: DC | PRN

## 2020-04-10 NOTE — ED Notes (Signed)
No need to redraw lactic acid per EDP.

## 2020-04-10 NOTE — H&P (Addendum)
History and Physical    Ethan Goodwin KLK:[id number] DOB: 1991/01/13 DOA: 04/10/2020  PCP: Daiva Eves, Lisette Grinder, MD  Patient coming from: home  I have personally briefly reviewed patient's old medical records in [facility]  Chief Complaint: here for IV antibiotics  HPI: Ethan Goodwin is Ethan Goodwin 29 y.o. male with medical history significant of HIV on dovato, syphillis, and IVDU (methamphetamine) who follows with ID as an outpatient who presents after Ethan Goodwin follow up call from the ED regarding  Ethan Goodwin positive blood culture.  He notes he's here for antibiotic therapy.  He presented to the ED on 6/24 with pain and swelling of his right hand, he acknowledged IVDU earlier that day to the hand.  He was discharged with keflex.  Unclear when he was called with these results, but he presented today for antibiotic therapy.  He notes continued swelling in R hand.  He notes this "just started yesterday".  He notes recent IVDU to this hand again recently (he notes day prior to yesterday he used meth and he thinks swelling is related to that).  He denies fevers, chills, cough.  Denies etoh.  Acknowledges meth (IV) and tobacco use.     ED Course: labs, imaging, abx. Admit to hospitalist for bacteremia and cellulitis.   Review of Systems: As per HPI otherwise all other systems reviewed and are negative.   Past Medical History:  Diagnosis Date  . HIV disease (HCC) 10/20/2019  . 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 FACIAL COSMETIC SURGERY      Social History  reports that he has been smoking cigarettes. He has never used smokeless tobacco. He reports previous alcohol use. He reports current drug use. Drugs: IV and Methamphetamines.  No Known Allergies  No family history on file.  Prior to Admission medications   Medication Sig Start Date End Date Taking? Authorizing Provider  Dolutegravir-lamiVUDine (DOVATO) 50-300 MG TABS Take 1 tablet by mouth daily.  10/20/19  Yes Daiva Eves, Lisette Grinder, MD  cephALEXin (KEFLEX) 500 MG capsule Take 1 capsule (500 mg total) by mouth 4 (four) times daily. 03/23/20   Raeford Razor, MD    Physical Exam: Vitals:   04/10/20 0700 04/10/20 0748 04/10/20 0857 04/10/20 1453  BP: 99/70 110/81 99/61 106/68  Pulse: 92 78 88 77  Resp: 18  16 19   Temp:  97.9 F (36.6 C) 98.2 F (36.8 C) 98 F (36.7 C)  TempSrc:   Oral Oral  SpO2: 100% 100% 97% 100%  Weight:        Constitutional: NAD, calm, comfortable Vitals:   04/10/20 0700 04/10/20 0748 04/10/20 0857 04/10/20 1453  BP: 99/70 110/81 99/61 106/68  Pulse: 92 78 88 77  Resp: 18  16 19   Temp:  97.9 F (36.6 C) 98.2 F (36.8 C) 98 F (36.7 C)  TempSrc:   Oral Oral  SpO2: 100% 100% 97% 100%  Weight:       Eyes: PERRL, lids and conjunctivae normal ENMT: Mucous membranes are moist. Posterior pharynx clear of any exudate or lesions.Normal dentition.  Neck: normal, supple, no masses, no thyromegaly Respiratory: clear to auscultation bilaterally, no wheezing, no crackles. Normal respiratory effort. No accessory muscle use.  Cardiovascular: Regular rate and rhythm, no murmurs / rubs / gallops. No extremity edema. 2+ pedal pulses. No carotid bruits.  Abdomen: no tenderness, no masses palpated. No hepatosplenomegaly. Bowel sounds positive.  Musculoskeletal: intact ROM to R hand  Skin: R hand  and forearm swleling, area of fluctuance on volar side of wrist  Neurologic: CN 2-12 grossly intact. Sensation intact Psychiatric: Normal judgment and insight. Alert and oriented x 3. Normal mood.   Labs on Admission: I have personally reviewed following labs and imaging studies  CBC: Recent Labs  Lab 04/10/20 0119 04/10/20 1007  WBC 9.2 7.7  NEUTROABS 5.9  --   HGB 12.8* 13.4  HCT 38.2* 39.8  MCV 97.7 99.5  PLT 274 250    Basic Metabolic Panel: Recent Labs  Lab 04/10/20 0119 04/10/20 1007  NA 136  --   K 3.4*  --   CL 103  --   CO2 24  --   GLUCOSE 107*   --   BUN 12  --   CREATININE 0.98 0.86  CALCIUM 8.9  --     GFR: Estimated Creatinine Clearance: 110 mL/min (by C-G formula based on SCr of 0.86 mg/dL).  Liver Function Tests: Recent Labs  Lab 04/10/20 0119  AST 20  ALT 19  ALKPHOS 75  BILITOT 0.9  PROT 7.8  ALBUMIN 3.9    Urine analysis:    Component Value Date/Time   COLORURINE YELLOW 04/10/2020 0147   APPEARANCEUR CLEAR 04/10/2020 0147   LABSPEC 1.025 04/10/2020 0147   PHURINE 6.5 04/10/2020 0147   GLUCOSEU NEGATIVE 04/10/2020 0147   HGBUR NEGATIVE 04/10/2020 0147   BILIRUBINUR NEGATIVE 04/10/2020 0147   KETONESUR NEGATIVE 04/10/2020 0147   PROTEINUR NEGATIVE 04/10/2020 0147   NITRITE NEGATIVE 04/10/2020 0147   LEUKOCYTESUR NEGATIVE 04/10/2020 0147    Radiological Exams on Admission: DG Chest 2 View  Result Date: 04/10/2020 CLINICAL DATA:  29 year old male with hand pain and swelling, abnormal labs. EXAM: CHEST - 2 VIEW COMPARISON:  None. FINDINGS: Lung volumes and mediastinal contours are within normal limits. Visualized tracheal air column is within normal limits. Borderline to mild bilateral perihilar increased interstitial markings, but otherwise both lungs appear clear. No pneumothorax or pleural effusion. Negative visible bowel gas pattern and osseous structures. IMPRESSION: No acute cardiopulmonary abnormality. Electronically Signed   By: Odessa Fleming M.D.   On: 04/10/2020 02:23   DG Hand Complete Right  Result Date: 04/10/2020 CLINICAL DATA:  29 year old male with right hand swelling. EXAM: RIGHT HAND - COMPLETE 3+ VIEW COMPARISON:  None. FINDINGS: There is no acute fracture or dislocation. The bones are well mineralized. No arthritic changes. There is diffuse soft tissue swelling of the hand which may represent cellulitis. Clinical correlation is recommended. No radiopaque foreign object or soft tissue gas. IMPRESSION: 1. No acute osseous pathology. 2. Diffuse soft tissue swelling of the hand may represent  cellulitis. Clinical correlation is recommended. Electronically Signed   By: Elgie Collard M.D.   On: 04/10/2020 02:22    EKG: Independently reviewed. pending  Assessment/Plan Active Problems:   Cellulitis of right hand   Bacteremia  Enterobacter Cloacae Bacteremia  R Hand and Wrist Skin and Soft Tissue Infection  Cellulitis: hx IVDU, he notes recent injection to his right hand and wrist area.  Fluctuant area on volar aspect of wrist concerning for abscess.  Plain films with soft tissue swelling  CXR unremarkable  Repeat blood cx (unfortunately, only 1 collected prior to abx).  7/12 blood cx from ~1100 were after abx.   Blood cx from 6/28 with enterobacter cloacae sensitive to cefepime  Continue vancomycin/cefepime Follow MRI hand/wrist ID consult, appreciate recommendations - recommending TTE if blood cx positive  Hx HIV: cd4 count 574 from 11/2019.  Continue home  dovato.  Appreciate ID recs, follow repeat CD4 count and VL   Hx Syphilis: follow RPR per ID  Hx IVDU: encourage cessation TOC team for resources Follow hepatitis C ab per ID  Tobacco Abuse: encourage cessation   DVT prophylaxis: lovenox Code Status:   full  Family Communication:  None at bedside  Disposition Plan:   Patient is from:  home  Anticipated DC to:  home  Anticipated DC date:  pending  Anticipated DC barriers: Pending blood cultures, improved skin/soft tissue infection  Consults called:  ID, [place]  Admission status:  inpatient   Severity of Illness: The appropriate patient status for this patient is INPATIENT. Inpatient status is judged to be reasonable and necessary in order to provide the required intensity of service to ensure the patient's safety. The patient's presenting symptoms, physical exam findings, and initial radiographic and laboratory data in the context of their chronic comorbidities is felt to place them at high risk for further clinical deterioration. Furthermore, it is not  anticipated that the patient will be medically stable for discharge from the hospital within 2 midnights of admission. The following factors support the patient status of inpatient.   " The patient's presenting symptoms include cellulitis of RUE. " The worrisome physical exam findings include erythema and swelling to RUE. " The initial radiographic and laboratory data are worrisome because of positive blood cultures from 02/2020. " The chronic co-morbidities include HIV, IVDU.   * I certify that at the point of admission it is my clinical judgment that the patient will require inpatient hospital care spanning beyond 2 midnights from the point of admission due to high intensity of service, high risk for further deterioration and high frequency of surveillance required.Lacretia Nicks MD [facility]  How to contact the [facility] Attending or Consulting provider 7A - 7P or covering provider during after hours 7P -7A, for this patient?   1. Check the care team in [facility] and look for Chares Slaymaker) attending/consulting TR[facility] provider listed and b) the [facility] team listed 2. Log into [email or web address] and use [place]'s universal password to access. If you do not have the password, please contact the hospital operator. 3. Locate the [facility] provider you are looking for under [facility] and page to Jerika Wales number that you can be directly reached. 4. If you still have difficulty reaching the provider, please page the [facility] (Director on Call) for the Hospitalists listed on amion for assistance.  04/10/2020, 3:53 PM

## 2020-04-10 NOTE — ED Provider Notes (Signed)
[facility] EMERGENCY DEPARTMENT Provider Note   CSN: [id number] Arrival date & time: 04/09/20  2334     History Chief Complaint  Patient presents with  . Abnormal Lab    Ethan Goodwin is a 29 y.o. male.  HPI     This is a 29 year old male with a history of HIV, secondary syphilis, IV drug abuse who presents with right hand swelling and positive blood cultures.  Patient was seen and evaluated on 03/23/2020.  At that time he was evaluated for cellulitis of the right hand likely secondary to IV drug use.  He injects methamphetamine into his right arm and hand.  He was called back for positive blood cultures after that visit.  He grew out Enterobacter CLoacae resistant to cefazolin.  Unfortunately he did not seek care until today.  He denies any fevers.  He reports worsening swelling of the right hand.  Unclear whether he has been taking any antibiotics.  He does report ongoing drug use with methamphetamine use as early as yesterday.  He denies any fevers.  At baseline he states he has shortness of breath.  No known sick contacts or Covid exposures.  Past Medical History:  Diagnosis Date  . HIV disease (HCC) 10/20/2019  . 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 Acute HIV infection syndrome (HCC) 10/20/2019  . 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 FACIAL COSMETIC SURGERY         No family history on file.  Social History   Tobacco Use  . Smoking status: Current Some Day Smoker    Types: Cigarettes  . Smokeless tobacco: Never Used  Substance Use Topics  . Alcohol use: Not Currently  . Drug use: Yes    Types: IV, Methamphetamines    Home Medications Prior to Admission medications   Medication Sig Start Date End Date Taking? Authorizing Provider  cephALEXin (KEFLEX) 500 MG capsule Take 1 capsule (500 mg total) by mouth 4 (four) times daily. 03/23/20   Raeford Razor, MD  Dolutegravir-lamiVUDine (DOVATO) 50-300 MG TABS Take 1 tablet by mouth daily. 10/20/19   Randall Hiss, MD    Allergies    Patient has no known allergies.  Review of Systems   Review of Systems  Constitutional: Positive for chills. Negative for fever.  Respiratory: Positive for shortness of breath. Negative for cough.   Cardiovascular: Negative for chest pain.  Gastrointestinal: Negative for abdominal pain, nausea and vomiting.  Musculoskeletal:       Right hand swelling  All other systems reviewed and are negative.   Physical Exam Updated Vital Signs BP 133/87   Pulse (!) 132   Temp 98.9 F (37.2 C)   Resp 18   Wt 61.9 kg   SpO2 100%   BMI 21.36 kg/m   Physical Exam Vitals and nursing note reviewed.  Constitutional:      Appearance: He is well-developed.     Comments: Anxious appearing and tearful, no acute distress  HENT:     Head: Normocephalic and atraumatic.     Nose: Nose normal.     Mouth/Throat:     Mouth: Mucous membranes are moist.  Eyes:     Pupils: Pupils are equal, round, and reactive to light.  Cardiovascular:     Rate and Rhythm: Regular rhythm. Tachycardia present.     Heart sounds: Normal heart sounds. No murmur heard.  Pulmonary:     Effort: Pulmonary effort is normal. No respiratory distress.     Breath sounds: Normal breath sounds. No wheezing.  Abdominal:     General: Bowel sounds are normal.     Palpations: Abdomen is soft.     Tenderness: There is no abdominal tenderness. There is no rebound.  Musculoskeletal:     Cervical back: Neck supple.     Comments: Diffuse swelling of the right hand with slight warmth noted, normal range of motion of the wrist and all 5 digits, 2+ radial pulse  Lymphadenopathy:     Cervical: No cervical adenopathy.  Skin:    General: Skin is warm and dry.  Neurological:     Mental Status: He is alert and oriented to person, place, and time.  Psychiatric:     Comments: Anxious appearing      ED Results / Procedures / Treatments   Labs (all labs ordered are listed, but only abnormal results are displayed) Labs Reviewed  COMPREHENSIVE METABOLIC PANEL - Abnormal; Notable for the following components:      Result Value   Potassium 3.4 (*)    Glucose, Bld 107 (*)    All other components within normal limits  CBC WITH DIFFERENTIAL/PLATELET - Abnormal; Notable for the following components:   RBC 3.91 (*)    Hemoglobin 12.8 (*)    HCT 38.2 (*)    All other components within normal limits  SARS CORONAVIRUS 2 BY RT PCR (HOSPITAL ORDER, PERFORMED IN [facility])  CULTURE, BLOOD (ROUTINE X 2)  CULTURE, BLOOD (ROUTINE X 2)  URINE CULTURE  LACTIC ACID, PLASMA  APTT  PROTIME-INR  URINALYSIS, ROUTINE W REFLEX MICROSCOPIC  LACTIC ACID, PLASMA    EKG None  Radiology DG Chest 2 View  Result Date: 04/10/2020 CLINICAL DATA:  29 year old male with hand pain and swelling, abnormal labs. EXAM: CHEST - 2 VIEW COMPARISON:  None. FINDINGS: Lung volumes and mediastinal contours are within normal limits. Visualized tracheal air column is within normal limits. Borderline to mild bilateral perihilar increased interstitial markings, but otherwise both lungs appear clear. No pneumothorax or pleural effusion. Negative visible bowel gas pattern and osseous structures. IMPRESSION: No acute cardiopulmonary abnormality. Electronically Signed   By: Odessa Fleming M.D.   On: 04/10/2020 02:23   DG Hand Complete Right  Result Date: 04/10/2020 CLINICAL DATA:  29 year old male with right hand swelling. EXAM: RIGHT HAND - COMPLETE 3+ VIEW COMPARISON:  None. FINDINGS: There is no acute fracture or dislocation. The bones are well mineralized. No arthritic changes. There is diffuse soft tissue swelling of the hand which may represent cellulitis. Clinical correlation is recommended. No radiopaque foreign object or soft tissue gas. IMPRESSION: 1. No acute osseous pathology. 2. Diffuse soft tissue  swelling of the hand may represent cellulitis. Clinical correlation is recommended. Electronically Signed   By: Elgie Collard M.D.   On: 04/10/2020 02:22    Procedures .Critical Care Performed by: Shon Baton, MD Authorized by: Shon Baton, MD   Critical care provider statement:    Critical care time (minutes):  31   Critical care time was exclusive of:  Separately billable procedures and treating other patients   Critical care was necessary to treat or prevent imminent or life-threatening deterioration of the following conditions:  Sepsis   Critical care was time spent personally by me on the following activities:  Discussions with consultants, evaluation of patient's response to treatment, examination of patient, ordering and performing treatments  and interventions, ordering and review of laboratory studies, ordering and review of radiographic studies, pulse oximetry, re-evaluation of patient's condition, obtaining history from patient or surrogate and review of old charts   I assumed direction of critical care for this patient from another provider in my specialty: no     (including critical care time)  Medications Ordered in ED Medications  vancomycin (VANCOCIN) IVPB 1000 mg/200 mL premix (1,000 mg Intravenous New Bag/Given 04/10/20 0252)  sodium chloride 0.9 % with ceFEPIme (MAXIPIME) ADS Med (has no administration in time range)  ceFEPIme (MAXIPIME) 1 g in sodium chloride 0.9 % 100 mL IVPB (1 g Intravenous New Bag/Given 04/10/20 0220)    ED Course  I have reviewed the triage vital signs and the nursing notes.  Pertinent labs & imaging results that were available during my care of the patient were reviewed by me and considered in my medical decision making (see chart for details).    MDM Rules/Calculators/A&P                           Patient presents with continued and worsening swelling of the right hand.  Positive blood cultures over 2 weeks ago.  He is  tearful but nontoxic-appearing.  Vital signs notable for pulse of 132.  He does report ongoing methamphetamine use.  Given tachycardia, sepsis work-up was initiated.  Lactate is normal.  He was given 30 cc/kg.  Blood cultures reviewed.  He grew out Enterobacter resistant to cefazolin.  For this reason he was given cefepime and Vanco.  No significant leukocytosis.  Chest x-ray without pneumothorax or pneumonia.  X-ray of the right hand just shows soft tissue swelling.  Have low suspicion for septic arthritis given normal range of motion and no significant pain with range of motion.  Unclear the clinical significance of his prior positive blood culture given that he has fared well over the last couple of weeks.  However, given clinical concern for worsening cellulitis, feel it is reasonable to admit for IV antibiotics pending blood cultures.  He is not a reliable patient as an outpatient either.  Chest x-ray shows no evidence of pneumothorax or pneumonia and Covid testing is negative.  3:11 AM Heart rate improved on reassessment in the 90s.  Discussed admission plan with patient.  He is agreeable.  Tavita Eastham was evaluated in Emergency Department on 04/10/2020 for the symptoms described in the history of present illness. He was evaluated in the context of the global COVID-19 pandemic, which necessitated consideration that the patient might be at risk for infection with the SARS-CoV-2 virus that causes COVID-19. Institutional protocols and algorithms that pertain to the evaluation of patients at risk for COVID-19 are in a state of rapid change based on information released by regulatory bodies including the CDC and federal and state organizations. These policies and algorithms were followed during the patient's care in the ED.   Final Clinical Impression(s) / ED Diagnoses Final diagnoses:  Positive blood cultures  Cellulitis of right hand  IVDU (intravenous drug user)    Rx / DC Orders ED Discharge  Orders    None       Telesforo Brosnahan, Mayer Masker, MD 04/10/20 [phone]

## 2020-04-10 NOTE — ED Notes (Signed)
Report provided for [facility]

## 2020-04-10 NOTE — Consult Note (Signed)
[facility]  Total days of antibiotics 2       Reason for Consult: enterobacter bacteremia and cellulitis   Referring Physician: powell  Active Problems:   Cellulitis of right hand   Bacteremia    HPI: Ethan Goodwin is a 29 y.o. male with hiv disease, hx of syphilis who presented to ED originally on 6/24 for right hand swelling where he injects metjamphtamine. He had sings of early cellulitis and was given prescription of cephalexin but does not appear that he took the antibiotics. His blood cx at that time showed enterobacter in the set that was drawn. He was called back to come to hospital for admission but did not do so, he states that he now comes in after his right dorsum of hand started to swell and become tender in the last 24-36hrs  on 7/12. He had slightly elevated WBC of 9K. Denies fevers, On exam he hs marked swelling to dorsum of right hand. He was started on cefepime and vancomycin. Awaiting culture results.  In terms of hiv disease, seen by dr Zenaida Niecevan dam in January and started on dovato but has not come back for 2nd visit. He reports taking dovato daily though did not follow up in person for visit   Past Medical History:  Diagnosis Date  . HIV disease (HCC) 10/20/2019  . 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 dolutegravir  50 mg Oral Daily   And  . lamiVUDine  300 mg Oral Daily  . enoxaparin (LOVENOX) injection  40 mg Subcutaneous Q24H    Social History   Tobacco Use  . Smoking status: Current Some Day Smoker    Types: Cigarettes  . Smokeless tobacco: Never Used  Substance Use Topics  . Alcohol use: Not Currently  . Drug use: Yes    Types: IV, Methamphetamines    No family history on file.  Review of Systems  Constitutional: Negative for fever, chills, diaphoresis, activity change, appetite change, fatigue and unexpected weight change.  HENT: Negative for congestion, sore throat,  rhinorrhea, sneezing, trouble swallowing and sinus pressure.  Eyes: Negative for photophobia and visual disturbance.  Respiratory: Negative for cough, chest tightness, shortness of breath, wheezing and stridor.  Cardiovascular: Negative for chest pain, palpitations and leg swelling.  Gastrointestinal: Negative for nausea, vomiting, abdominal pain, diarrhea, constipation, blood in stool, abdominal distention and anal bleeding.  Genitourinary: Negative for dysuria, hematuria, flank pain and difficulty urinating.  Musculoskeletal: Negative for myalgias, back pain, joint swelling, arthralgias and gait problem.  Skin: +right hand wound Neurological: Negative for dizziness, tremors, weakness and light-headedness.  Hematological: Negative for adenopathy. Does not bruise/bleed easily.  Psychiatric/Behavioral: Negative for behavioral problems, confusion, sleep disturbance, dysphoric mood, decreased concentration and agitation.     OBJECTIVE: Temp:  [97.9 F (36.6 C)-98.9 F (37.2 C)] 98 F (36.7 C) (07/12 1453) Pulse Rate:  [77-132] 77 (07/12 1453) Resp:  [13-19] 19 (07/12 1453) BP: (93-133)/(61-87) 106/68 (07/12 1453) SpO2:  [93 %-100 %] 100 % (07/12 1453) Weight:  [61.9 kg] 61.9 kg (07/11 2345) Physical Exam  Constitutional: He is oriented to person, place, and time. He appears well-developed and well-nourished. No distress.  HENT:  Mouth/Throat: Oropharynx is clear and moist. No oropharyngeal exudate.  Cardiovascular: Normal rate, regular rhythm and normal heart sounds. Exam reveals no gallop and no friction rub.  No murmur heard.  Pulmonary/Chest: Effort normal and breath sounds normal. No respiratory distress. He has no  wheezes.  Abdominal: Soft. Bowel sounds are normal. He exhibits no distension. There is no tenderness.  Lymphadenopathy:  He has no cervical adenopathy.  Neurological: He is alert and oriented to person, place, and time.  Skin: Skin no erythema. swollen to right dorsum  of hand Psychiatric: He has a normal mood and affect. His behavior is normal.    LABS: Results for orders placed or performed during the hospital encounter of 04/10/20 (from the past 48 hour(s))  Lactic acid, plasma     Status: None   Collection Time: 04/10/20  1:19 AM  Result Value Ref Range   Lactic Acid, Venous 0.8 0.5 - 1.9 mmol/L    Comment: Performed at [facility], [address] [place], [place] [address]  Comprehensive metabolic panel     Status: Abnormal   Collection Time: 04/10/20  1:19 AM  Result Value Ref Range   Sodium 136 135 - 145 mmol/L   Potassium 3.4 (L) 3.5 - 5.1 mmol/L   Chloride 103 98 - 111 mmol/L   CO2 24 22 - 32 mmol/L   Glucose, Bld 107 (H) 70 - 99 mg/dL    Comment: Glucose reference range applies only to samples taken after fasting for at least 8 hours.   BUN 12 6 - 20 mg/dL   Creatinine, Ser 6.64 0.61 - 1.24 mg/dL   Calcium 8.9 8.9 - 40.3 mg/dL   Total Protein 7.8 6.5 - 8.1 g/dL   Albumin 3.9 3.5 - 5.0 g/dL   AST 20 15 - 41 U/L   ALT 19 0 - 44 U/L   Alkaline Phosphatase 75 38 - 126 U/L   Total Bilirubin 0.9 0.3 - 1.2 mg/dL   GFR calc non Af Amer >60 >60 mL/min   GFR calc Af Amer >60 >60 mL/min   Anion gap 9 5 - 15    Comment: Performed at [facility], [address] [place], [place] [address]  CBC WITH DIFFERENTIAL     Status: Abnormal   Collection Time: 04/10/20  1:19 AM  Result Value Ref Range   WBC 9.2 4.0 - 10.5 K/uL   RBC 3.91 (L) 4.22 - 5.81 MIL/uL   Hemoglobin 12.8 (L) 13.0 - 17.0 g/dL   HCT 95.6 (L) 39 - 52 %   MCV 97.7 80.0 - 100.0 fL   MCH 32.7 26.0 - 34.0 pg   MCHC 33.5 30.0 - 36.0 g/dL   RDW 38.7 56.4 - 33.2 %   Platelets 274 150 - 400 K/uL   nRBC 0.0 0.0 - 0.2 %   Neutrophils Relative % 65 %   Neutro Abs 5.9 1.7 - 7.7 K/uL   Lymphocytes Relative 25 %   Lymphs Abs 2.3 0.7 - 4.0 K/uL   Monocytes Relative 9 %   Monocytes Absolute 0.8 0 - 1 K/uL   Eosinophils Relative 1 %   Eosinophils Absolute 0.1 0 -  0 K/uL   Basophils Relative 0 %   Basophils Absolute 0.0 0 - 0 K/uL   Immature Granulocytes 0 %   Abs Immature Granulocytes 0.04 0.00 - 0.07 K/uL    Comment: Performed at [facility], [address] [place], [place] [address]  APTT     Status: None   Collection Time: 04/10/20  1:19 AM  Result Value Ref Range   aPTT 31 24 - 36 seconds    Comment: Performed at [facility], [address] [place],  [place] [address]  Protime-INR     Status: None   Collection Time: 04/10/20  1:19 AM  Result Value Ref Range   Prothrombin Time 12.8 11.4 - 15.2 seconds   INR 1.0 0.8 - 1.2    Comment: (NOTE) INR goal varies based on device and disease states. Performed at [facility], [address] [place], [place] [address]   Urinalysis, Routine w reflex microscopic     Status: None   Collection Time: 04/10/20  1:47 AM  Result Value Ref Range   Color, Urine YELLOW YELLOW   APPearance CLEAR CLEAR   Specific Gravity, Urine 1.025 1.005 - 1.030   pH 6.5 5.0 - 8.0   Glucose, UA NEGATIVE NEGATIVE mg/dL   Hgb urine dipstick NEGATIVE NEGATIVE   Bilirubin Urine NEGATIVE NEGATIVE   Ketones, ur NEGATIVE NEGATIVE mg/dL   Protein, ur NEGATIVE NEGATIVE mg/dL   Nitrite NEGATIVE NEGATIVE   Leukocytes,Ua NEGATIVE NEGATIVE    Comment: Microscopic not done on urines with negative protein, blood, leukocytes, nitrite, or glucose < 500 mg/dL. Performed at [facility], [address] [place], [place] [address]   SARS Coronavirus 2 by RT PCR (hospital order, performed in [facility] hospital lab) Nasopharyngeal Nasopharyngeal Swab     Status: None   Collection Time: 04/10/20  1:47 AM   Specimen: Nasopharyngeal Swab  Result Value Ref Range   SARS Coronavirus 2 NEGATIVE NEGATIVE    Comment: (NOTE) SARS-CoV-2 target nucleic acids are NOT DETECTED.  The SARS-CoV-2 RNA is generally detectable in upper and lower respiratory specimens during the acute phase of infection.  The lowest concentration of SARS-CoV-2 viral copies this assay can detect is 250 copies / mL. A negative result does not preclude SARS-CoV-2 infection and should not be used as the sole basis for treatment or other patient management decisions.  A negative result may occur with improper specimen collection / handling, submission of specimen other than nasopharyngeal swab, presence of viral mutation(s) within the areas targeted by this assay, and inadequate number of viral copies (<250 copies / mL). A negative result must be combined with clinical observations, patient history, and epidemiological information.  Fact Sheet for Patients:   BoilerBrush.com.cy  Fact Sheet for Healthcare Providers: https://pope.com/  This test is not yet approved or  cleared by the [place] FDA and has been authorized for detection and/or diagnosis of SARS-CoV-2 by FDA under an Emergency Use Authorization (EUA).  This EUA will remain in effect (meaning this test can be used) for the duration of the COVID-19 declaration under Section 564(b)(1) of the Act, 21 U.S.C. section 360bbb-3(b)(1), unless the authorization is terminated or revoked sooner.  Performed at [facility], [address] [place], [place] [address]   Sedimentation rate     Status: Abnormal   Collection Time: 04/10/20 10:07 AM  Result Value Ref Range   Sed Rate 26 (H) 0 - 16 mm/hr    Comment: Performed at [facility], [address] [place], [place] [address]  C-reactive protein     Status: Abnormal   Collection Time: 04/10/20 10:07 AM  Result Value Ref Range   CRP 5.3 (H) <1.0 mg/dL    Comment: Performed at [facility], [address], [place], [place] [address]  CBC     Status: Abnormal   Collection Time: 04/10/20 10:07 AM  Result Value Ref Range   WBC 7.7 4.0 - 10.5 K/uL   RBC  4.00 (L) 4.22 - 5.81 MIL/uL   Hemoglobin 13.4 13.0  - 17.0 g/dL   HCT 63.7 39 - 52 %   MCV 99.5 80.0 - 100.0 fL   MCH 33.5 26.0 - 34.0 pg   MCHC 33.7 30.0 - 36.0 g/dL   RDW 85.8 85.0 - 27.7 %   Platelets 250 150 - 400 K/uL   nRBC 0.0 0.0 - 0.2 %    Comment: Performed at [facility], [address], [place], [place] [address]  Creatinine, serum     Status: None   Collection Time: 04/10/20 10:07 AM  Result Value Ref Range   Creatinine, Ser 0.86 0.61 - 1.24 mg/dL   GFR calc non Af Amer >60 >60 mL/min   GFR calc Af Amer >60 >60 mL/min    Comment: Performed at [facility], [address], [place], [place] [address]    MICRO:  IMAGING: DG Chest 2 View  Result Date: 04/10/2020 CLINICAL DATA:  29 year old male with hand pain and swelling, abnormal labs. EXAM: CHEST - 2 VIEW COMPARISON:  None. FINDINGS: Lung volumes and mediastinal contours are within normal limits. Visualized tracheal air column is within normal limits. Borderline to mild bilateral perihilar increased interstitial markings, but otherwise both lungs appear clear. No pneumothorax or pleural effusion. Negative visible bowel gas pattern and osseous structures. IMPRESSION: No acute cardiopulmonary abnormality. Electronically Signed   By: Odessa Fleming M.D.   On: 04/10/2020 02:23   DG Hand Complete Right  Result Date: 04/10/2020 CLINICAL DATA:  29 year old male with right hand swelling. EXAM: RIGHT HAND - COMPLETE 3+ VIEW COMPARISON:  None. FINDINGS: There is no acute fracture or dislocation. The bones are well mineralized. No arthritic changes. There is diffuse soft tissue swelling of the hand which may represent cellulitis. Clinical correlation is recommended. No radiopaque foreign object or soft tissue gas. IMPRESSION: 1. No acute osseous pathology. 2. Diffuse soft tissue swelling of the hand may represent cellulitis. Clinical correlation is recommended. Electronically Signed   By: Elgie Collard M.D.   On: 04/10/2020 02:22    Assessment/Plan:  29yo M with HIV disease, hx of syphilis, hx of IVDU who is admitted for right hand swelling after injecting into his hand. Also has untreated gram negative bacteremia from end of June (unclear if still bacteremic)  Agree with continuing vancomycin and cefepime for the time being. Await for culture results to return to determine how to narrow abtx If blood cx are positive, recommend to get TTE to look for right sided endocarditis  In terms of hiv disease, will check cd 4 count and HIV VL   Hx of syphilis will check RPR  Due to IVDU, will check hep C ab.  Duke Salvia Drue Second MD MPH [facility] [phone]

## 2020-04-10 NOTE — Progress Notes (Signed)
Pharmacy Antibiotic Note  Ethan Goodwin is a 29 y.o. male admitted on 04/10/2020 with hx of HIV, secondary syphilis, IV drug abuse who was called back for positive blood cultures growing Enterobacter Cloacae.  Pharmacy has been consulted for vancomycin and cefepime dosing.  Plan: Cefepime 2gm IV q8h Vancomycin 1gm IV q12h Follow renal function, cultures and clinical course  Weight: 61.9 kg (136 lb 6.4 oz)  Temp (24hrs), Avg:98.3 F (36.8 C), Min:97.9 F (36.6 C), Max:98.9 F (37.2 C)  Recent Labs  Lab 04/10/20 0119  WBC 9.2  CREATININE 0.98  LATICACIDVEN 0.8    Estimated Creatinine Clearance: 96.5 mL/min (by C-G formula based on SCr of 0.98 mg/dL).    No Known Allergies  Thank you for allowing pharmacy to be a part of this patient's care.  Arley Phenix RPh 04/10/2020, 9:34 AM

## 2020-04-10 NOTE — ED Notes (Signed)
RN offered pt food. Pt stated he just wants to sleep.

## 2020-04-11 LAB — URINE CULTURE: Culture: NO GROWTH

## 2020-04-11 LAB — HIV-1 RNA QUANT-NO REFLEX-BLD
HIV 1 RNA Quant: 20 copies/mL
LOG10 HIV-1 RNA: 1.301 log10copy/mL

## 2020-04-11 LAB — COMPREHENSIVE METABOLIC PANEL
ALT: 17 U/L (ref 0–44)
AST: 15 U/L (ref 15–41)
Albumin: 3.5 g/dL (ref 3.5–5.0)
Alkaline Phosphatase: 67 U/L (ref 38–126)
Anion gap: 12 (ref 5–15)
BUN: 13 mg/dL (ref 6–20)
CO2: 23 mmol/L (ref 22–32)
Calcium: 9 mg/dL (ref 8.9–10.3)
Chloride: 102 mmol/L (ref 98–111)
Creatinine, Ser: 0.99 mg/dL (ref 0.61–1.24)
GFR calc Af Amer: >60 mL/min (ref >60)
GFR calc non Af Amer: >60 mL/min (ref >60)
Glucose, Bld: 114 mg/dL — ABNORMAL HIGH (ref 70–99)
Potassium: 4.2 mmol/L (ref 3.5–5.1)
Sodium: 137 mmol/L (ref 135–145)
Total Bilirubin: 1 mg/dL (ref 0.3–1.2)
Total Protein: 7.4 g/dL (ref 6.5–8.1)

## 2020-04-11 LAB — CBC
HCT: 40.7 % (ref 39.0–52.0)
Hemoglobin: 13.2 g/dL (ref 13.0–17.0)
MCH: 32.4 pg (ref 26.0–34.0)
MCHC: 32.4 g/dL (ref 30.0–36.0)
MCV: 99.8 fL (ref 80.0–100.0)
Platelets: 230 10*3/uL (ref 150–400)
RBC: 4.08 MIL/uL — ABNORMAL LOW (ref 4.22–5.81)
RDW: 12.8 % (ref 11.5–15.5)
WBC: 6.3 10*3/uL (ref 4.0–10.5)
nRBC: 0 % (ref 0.0–0.2)

## 2020-04-11 LAB — RPR
RPR Ser Ql: REACTIVE — AB
RPR Titer: 1:2 {titer}

## 2020-04-11 LAB — T-HELPER CELLS (CD4) COUNT (NOT AT ARMC)
CD4 % Helper T Cell: 41 % (ref 33–65)
CD4 T Cell Abs: 801 /uL (ref 400–1790)

## 2020-04-11 NOTE — Progress Notes (Signed)
[facility]    Date of Admission:  04/10/2020   Total days of antibiotics 3/vanco-cefepime   ID: Ethan Goodwin is a 29 y.o. male with right hand cellulitis Active Problems:   Cellulitis of right hand   Positive blood cultures   IVDU (intravenous drug user)    Subjective: Afebrile, hand swelling improving; he reports being homeless but can stay with his sister for a few days post hospitalization; I have discussed hiv results with him - well controlled on dovato  Medications:  . dolutegravir  50 mg Oral Daily   And  . lamiVUDine  300 mg Oral Daily  . enoxaparin (LOVENOX) injection  40 mg Subcutaneous Q24H    Objective: Vital signs in last 24 hours: Temp:  [97.9 F (36.6 C)-98.6 F (37 C)] 98.6 F (37 C) (07/13 1302) Pulse Rate:  [83-93] 86 (07/13 1302) Resp:  [14-19] 16 (07/13 1302) BP: (107-117)/(68-77) 107/68 (07/13 1302) SpO2:  [98 %-100 %] 98 % (07/13 1302) Weight:  [61.8 kg] 61.8 kg (07/12 2220) Physical Exam  Constitutional: He is oriented to person, place, and time. He appears well-developed and well-nourished. No distress.  HENT:  Mouth/Throat: Oropharynx is clear and moist. No oropharyngeal exudate.  Cardiovascular: Normal rate, regular rhythm and normal heart sounds. Exam reveals no gallop and no friction rub.  No murmur heard.  Pulmonary/Chest: Effort normal and breath sounds normal. No respiratory distress. He has no wheezes.  Abdominal: Soft. Bowel sounds are normal. He exhibits no distension. There is no tenderness.  Lymphadenopathy:  He has no cervical adenopathy.  Neurological: He is alert and oriented to person, place, and time.  Ext: reduced swelling of right dorsum of hand Skin: Skin is warm and dry. No rash noted. No erythema.  Psychiatric: tearful   Lab Results Recent Labs    04/10/20 0119 04/10/20 0119 04/10/20 1007 04/11/20 0700  WBC 9.2   < > 7.7 6.3  HGB 12.8*   < > 13.4 13.2  HCT 38.2*   < > 39.8 40.7    NA 136  --   --  137  K 3.4*  --   --  4.2  CL 103  --   --  102  CO2 24  --   --  23  BUN 12  --   --  13  CREATININE 0.98   < > 0.86 0.99   < > = values in this interval not displayed.   Liver Panel Recent Labs    04/10/20 0119 04/11/20 0700  PROT 7.8 7.4  ALBUMIN 3.9 3.5  AST 20 15  ALT 19 17  ALKPHOS 75 67  BILITOT 0.9 1.0   Sedimentation Rate Recent Labs    04/10/20 1007  ESRSEDRATE 26*   C-Reactive Protein Recent Labs    04/10/20 1007  CRP 5.3*    Microbiology: 7/12 blood cx NGTD x 24hrs Studies/Results: DG Chest 2 View  Result Date: 04/10/2020 CLINICAL DATA:  29 year old male with hand pain and swelling, abnormal labs. EXAM: CHEST - 2 VIEW COMPARISON:  None. FINDINGS: Lung volumes and mediastinal contours are within normal limits. Visualized tracheal air column is within normal limits. Borderline to mild bilateral perihilar increased interstitial markings, but otherwise both lungs appear clear. No pneumothorax or pleural effusion. Negative visible bowel gas pattern and osseous structures. IMPRESSION: No acute cardiopulmonary abnormality. Electronically Signed   By: Odessa Fleming M.D.   On: 04/10/2020 02:23   MR HAND RIGHT W WO CONTRAST  Result Date: 04/10/2020 CLINICAL DATA:  Right hand swelling, IV drug abuse, question of infection EXAM: MRI OF THE RIGHT HAND WITHOUT AND WITH CONTRAST; MRI OF THE RIGHT WRIST WITHOUT AND WITH CONTRAST TECHNIQUE: Multiplanar, multisequence MR imaging of the right was performed before and after the administration of intravenous contrast. CONTRAST:  41mL GADAVIST GADOBUTROL 1 MMOL/ML IV SOLN COMPARISON:  None. FINDINGS: Bones/Joint/Cartilage Normal osseous marrow signal is seen throughout. No osseous fracture, marrow edema, or cortical destruction. No large joint effusions are seen. The articular surfaces are well maintained. Ligaments The visualized portions of the ligaments are intact. Muscles and Tendons The muscles surrounding the hand  and wrist are normal in appearance without focal atrophy edema, or tear. There is a small amount of fluid seen surrounding extensor compartment 1 and 2 at the level of the wrist. The remainder of the extensor and flexor tendons are intact. Soft tissues Diffuse dorsal subcutaneous edema and non loculated fluid seen surrounding the wrist and hand. No loculated fluid collections are noted. No subcutaneous emphysema or areas of signal dropout. Overlying skin thickening noted. IMPRESSION: Findings consistent with diffuse cellulitis and non loculated fluid/phlegmon at the level of the wrist. No definite evidence osteomyelitis or abscess formation. Mild extensor compartment 1 and 2 tenosynovitis Electronically Signed   By: Jonna [place] M.D.   On: 04/10/2020 21:46   MR WRIST RIGHT W WO CONTRAST  Result Date: 04/10/2020 CLINICAL DATA:  Right hand swelling, IV drug abuse, question of infection EXAM: MRI OF THE RIGHT HAND WITHOUT AND WITH CONTRAST; MRI OF THE RIGHT WRIST WITHOUT AND WITH CONTRAST TECHNIQUE: Multiplanar, multisequence MR imaging of the right was performed before and after the administration of intravenous contrast. CONTRAST:  45mL GADAVIST GADOBUTROL 1 MMOL/ML IV SOLN COMPARISON:  None. FINDINGS: Bones/Joint/Cartilage Normal osseous marrow signal is seen throughout. No osseous fracture, marrow edema, or cortical destruction. No large joint effusions are seen. The articular surfaces are well maintained. Ligaments The visualized portions of the ligaments are intact. Muscles and Tendons The muscles surrounding the hand and wrist are normal in appearance without focal atrophy edema, or tear. There is a small amount of fluid seen surrounding extensor compartment 1 and 2 at the level of the wrist. The remainder of the extensor and flexor tendons are intact. Soft tissues Diffuse dorsal subcutaneous edema and non loculated fluid seen surrounding the wrist and hand. No loculated fluid collections are noted. No  subcutaneous emphysema or areas of signal dropout. Overlying skin thickening noted. IMPRESSION: Findings consistent with diffuse cellulitis and non loculated fluid/phlegmon at the level of the wrist. No definite evidence osteomyelitis or abscess formation. Mild extensor compartment 1 and 2 tenosynovitis Electronically Signed   By: Jonna [place] M.D.   On: 04/10/2020 21:46   DG Hand Complete Right  Result Date: 04/10/2020 CLINICAL DATA:  29 year old male with right hand swelling. EXAM: RIGHT HAND - COMPLETE 3+ VIEW COMPARISON:  None. FINDINGS: There is no acute fracture or dislocation. The bones are well mineralized. No arthritic changes. There is diffuse soft tissue swelling of the hand which may represent cellulitis. Clinical correlation is recommended. No radiopaque foreign object or soft tissue gas. IMPRESSION: 1. No acute osseous pathology. 2. Diffuse soft tissue swelling of the hand may represent cellulitis. Clinical correlation is recommended. Electronically Signed   By: Elgie Collard M.D.   On: 04/10/2020 02:22     Assessment/Plan: hiv disease = well controlled. Cd 4 count of 801 and VL of 20. He is doing well with  his adherence to dovato. We can make sure to give him another 30 day prior to discharge to give in hand so that does not need to go to pharmacy  Right hand cellulitis = appears improving with current regimen. Can switch oral regimen after another 24hrs  Hx of enterobacter bacteremia = continue on cefepime. We can switch bactrim for oral regimen which could also cover cellulitis.  Hx of syphilis = now has baseline titer of 1:2, has been treated  Hx of ivdu= still hep C negative.  Homelessness =will get clinical social worker to start engaging patient and assess for housing.  [facility] Cell: [phone] Pager: [phone]  04/11/2020, 4:20 PM

## 2020-04-11 NOTE — Progress Notes (Signed)
PROGRESS NOTE    Ethan Goodwin  HYW:[id number] DOB: Oct 03, 1990 DOA: 04/10/2020 PCP: Daiva Eves, Lisette Grinder, MD      Brief Narrative:  [name] is a 29 y.o. M with IVDU (meth) and HIV well controlled and hx syphilis, homelessness who presented with right wrist pain.  In the ER, MRI showed a dorsal phlegmon and 1st and 2nd digit extensor tenosynovitis.  Patient started on vancomycin and cefepime.          Assessment & Plan:  Wrist cellulitis Extensor tenosynovitis This seems to be improving per patient.  Finger movement is not overly painful.  -Continue vancomycin -Continue cefepime -Consult ID, appreciate cares -Consult Dr. Izora Ribas, appreciate cares     Recent Enterobacter cloacae bacteremia -Follow repeat cultures -Continue antibiotics as above  HIV The viral load is low, CD4 is good.  Hep C negative. -Continue dolutegravir, lamivudine  Syphilis resolved Titers low   Hypokalemia Resolved       Disposition: Status is: Inpatient  Remains inpatient appropriate because:Inpatient level of care appropriate due to severity of illness   Dispo: The patient is from: Home              Anticipated d/c is to: Home              Anticipated d/c date is: 1 day              Patient currently is not medically stable to d/c.              MDM: The below labs and imaging reports were reviewed and summarized above.  Medication management as above.     DVT prophylaxis: enoxaparin (LOVENOX) injection 40 mg Start: 04/10/20 1200  Code Status: FULL Family Communication:     Consultants:   ID  Hand surgery  Procedures:   7/12 MRI hand -- phlegmon and tenosynovitis  Antimicrobials:   Vancomycn and Cefepime 7/12 >>   Culture data:   7/12 blood cultures x2 -- ngtd           Subjective: Feeling no fever, no confusion.  No malaise.  Hand is slightly better.  Objective: Vitals:   04/10/20 2125 04/10/20 2220 04/11/20 0541 04/11/20  1302  BP: 110/70  112/72 107/68  Pulse: 93  91 86  Resp: 16  14 16   Temp: 97.9 F (36.6 C)  98.2 F (36.8 C) 98.6 F (37 C)  TempSrc: Oral  Oral Oral  SpO2: 100%  100% 98%  Weight:  61.8 kg    Height:  5\' 7"  (1.702 m)      Intake/Output Summary (Last 24 hours) at 04/11/2020 1649 Last data filed at 04/11/2020 0542 Gross per 24 hour  Intake 200 ml  Output 1150 ml  Net -950 ml   Filed Weights   04/09/20 2345 04/10/20 2220  Weight: 61.9 kg 61.8 kg    Examination: General appearance: thin adult male, alert and in no acute distress.  Standing in room HEENT: Anicteric, conjunctiva pink, lids and lashes normal. No nasal deformity, discharge, epistaxis.  Lips moist.   Skin: Warm and dry.  No suspicious rashes or lesions.  Numerous tattoos. Cardiac: RRR, nl S1-S2, no murmurs appreciated.  Capillary refill is brisk.  JVP normale.  No LE edema.   Respiratory: Normal respiratory rate and rhythm.  CTAB without rales or wheezes. Abdomen:  MSK: The right hand is swollen and has some large soft tender area in the thenar prominence, mild swelling up the rist.  Hands/fingers  movement is unrestrited. Neuro: Awake and alert.  EOMI, moves all extremities. Speech fluent.    Psych: Sensorium intact and responding to questions, attention normal. Affect normal.  Judgment and insight appear normal.    Data Reviewed: I have personally reviewed following labs and imaging studies:  CBC: Recent Labs  Lab 04/10/20 0119 04/10/20 1007 04/11/20 0700  WBC 9.2 7.7 6.3  NEUTROABS 5.9  --   --   HGB 12.8* 13.4 13.2  HCT 38.2* 39.8 40.7  MCV 97.7 99.5 99.8  PLT 274 250 230   Basic Metabolic Panel: Recent Labs  Lab 04/10/20 0119 04/10/20 1007 04/11/20 0700  NA 136  --  137  K 3.4*  --  4.2  CL 103  --  102  CO2 24  --  23  GLUCOSE 107*  --  114*  BUN 12  --  13  CREATININE 0.98 0.86 0.99  CALCIUM 8.9  --  9.0   GFR: Estimated Creatinine Clearance: 95.4 mL/min (by C-G formula based on SCr  of 0.99 mg/dL). Liver Function Tests: Recent Labs  Lab 04/10/20 0119 04/11/20 0700  AST 20 15  ALT 19 17  ALKPHOS 75 67  BILITOT 0.9 1.0  PROT 7.8 7.4  ALBUMIN 3.9 3.5   No results for input(s): LIPASE, AMYLASE in the last 168 hours. No results for input(s): AMMONIA in the last 168 hours. Coagulation Profile: Recent Labs  Lab 04/10/20 0119  INR 1.0   Cardiac Enzymes: No results for input(s): CKTOTAL, CKMB, CKMBINDEX, TROPONINI in the last 168 hours. BNP (last 3 results) No results for input(s): PROBNP in the last 8760 hours. HbA1C: No results for input(s): HGBA1C in the last 72 hours. CBG: No results for input(s): GLUCAP in the last 168 hours. Lipid Profile: No results for input(s): CHOL, HDL, LDLCALC, TRIG, CHOLHDL, LDLDIRECT in the last 72 hours. Thyroid Function Tests: No results for input(s): TSH, T4TOTAL, FREET4, T3FREE, THYROIDAB in the last 72 hours. Anemia Panel: No results for input(s): VITAMINB12, FOLATE, FERRITIN, TIBC, IRON, RETICCTPCT in the last 72 hours. Urine analysis:    Component Value Date/Time   COLORURINE YELLOW 04/10/2020 0147   APPEARANCEUR CLEAR 04/10/2020 0147   LABSPEC 1.025 04/10/2020 0147   PHURINE 6.5 04/10/2020 0147   GLUCOSEU NEGATIVE 04/10/2020 0147   HGBUR NEGATIVE 04/10/2020 0147   BILIRUBINUR NEGATIVE 04/10/2020 0147   KETONESUR NEGATIVE 04/10/2020 0147   PROTEINUR NEGATIVE 04/10/2020 0147   NITRITE NEGATIVE 04/10/2020 0147   LEUKOCYTESUR NEGATIVE 04/10/2020 0147   Sepsis Labs: @LABRCNTIP (procalcitonin:4,lacticacidven:4)  ) Recent Results (from the past 240 hour(s))  Blood Culture (routine x 2)     Status: None (Preliminary result)   Collection Time: 04/10/20  1:20 AM   Specimen: BLOOD RIGHT ARM  Result Value Ref Range Status   Specimen Description   Final    BLOOD RIGHT ARM Performed at [facility], [address] [place], [place]    Special Requests   Final    BOTTLES DRAWN AEROBIC AND  ANAEROBIC Blood Culture adequate volume Performed at [facility], [address] [place], [place]    Culture   Final    NO GROWTH 1 DAY Performed at [facility], [address], [place], [place]    Report Status PENDING  Incomplete  Urine culture     Status: None   Collection Time: 04/10/20  1:47 AM   Specimen: In/Out Cath Urine  Result Value Ref  Range Status   Specimen Description   Final    IN/OUT CATH URINE Performed at [facility], [address] [place], [place] [address]    Special Requests   Final    NONE Performed at [facility], [address] [place], [place] [address]    Culture   Final    NO GROWTH Performed at [facility], [address], [place], [place] [address]    Report Status 04/11/2020 FINAL  Final  SARS Coronavirus 2 by RT PCR (hospital order, performed in [facility] hospital lab) Nasopharyngeal Nasopharyngeal Swab     Status: None   Collection Time: 04/10/20  1:47 AM   Specimen: Nasopharyngeal Swab  Result Value Ref Range Status   SARS Coronavirus 2 NEGATIVE NEGATIVE Final    Comment: (NOTE) SARS-CoV-2 target nucleic acids are NOT DETECTED.  The SARS-CoV-2 RNA is generally detectable in upper and lower respiratory specimens during the acute phase of infection. The lowest concentration of SARS-CoV-2 viral copies this assay can detect is 250 copies / mL. A negative result does not preclude SARS-CoV-2 infection and should not be used as the sole basis for treatment or other patient management decisions.  A negative result may occur with improper specimen collection / handling, submission of specimen other than nasopharyngeal swab, presence of viral mutation(s) within the areas targeted by this assay, and inadequate number of viral copies (<250 copies / mL). A negative result must be combined with clinical observations, patient history, and epidemiological information.  Fact Sheet  for Patients:   BoilerBrush.com.cy  Fact Sheet for Healthcare Providers: https://pope.com/  This test is not yet approved or  cleared by the [place] FDA and has been authorized for detection and/or diagnosis of SARS-CoV-2 by FDA under an Emergency Use Authorization (EUA).  This EUA will remain in effect (meaning this test can be used) for the duration of the COVID-19 declaration under Section 564(b)(1) of the Act, 21 U.S.C. section 360bbb-3(b)(1), unless the authorization is terminated or revoked sooner.  Performed at [facility], [address] [place], [place] [address]   Culture, blood (routine x 2)     Status: None (Preliminary result)   Collection Time: 04/10/20 11:01 AM   Specimen: BLOOD LEFT HAND  Result Value Ref Range Status   Specimen Description   Final    BLOOD LEFT HAND Performed at [facility], [address], [place], [place] [address]    Special Requests   Final    BOTTLES DRAWN AEROBIC AND ANAEROBIC Blood Culture adequate volume Performed at [facility], [address] [place], [place] [address]    Culture   Final    NO GROWTH < 24 HOURS Performed at [facility], [address], [place], [place] [address]    Report Status PENDING  Incomplete  Culture, blood (routine x 2)     Status: None (Preliminary result)   Collection Time: 04/10/20 11:04 AM   Specimen: BLOOD RIGHT ARM  Result Value Ref Range Status   Specimen Description   Final    BLOOD RIGHT ARM Performed at [facility], [address] [place], [place] [address]    Special Requests   Final    BOTTLES DRAWN AEROBIC AND ANAEROBIC Blood Culture adequate volume Performed at [facility], [address] [place], [place] [address]    Culture   Final    NO GROWTH <  24 HOURS Performed at [facility], [address] [place], [place] [address]      Report Status PENDING  Incomplete         Radiology Studies: DG Chest 2 View  Result Date: 04/10/2020 CLINICAL DATA:  [age] year old male with hand pain and swelling, abnormal labs. EXAM: CHEST - 2 VIEW COMPARISON:  None. FINDINGS: Lung volumes and mediastinal contours are within normal limits. Visualized tracheal air column is within normal limits. Borderline to mild bilateral perihilar increased interstitial markings, but otherwise both lungs appear clear. No pneumothorax or pleural effusion. Negative visible bowel gas pattern and osseous structures. IMPRESSION: No acute cardiopulmonary abnormality. Electronically Signed   By: Odessa FlemingH  Hall M.D.   On: 04/10/2020 02:23   MR HAND RIGHT W WO CONTRAST  Result Date: 04/10/2020 CLINICAL DATA:  Right hand swelling, IV drug abuse, question of infection EXAM: MRI OF THE RIGHT HAND WITHOUT AND WITH CONTRAST; MRI OF THE RIGHT WRIST WITHOUT AND WITH CONTRAST TECHNIQUE: Multiplanar, multisequence MR imaging of the right was performed before and after the administration of intravenous contrast. CONTRAST:  6mL GADAVIST GADOBUTROL 1 MMOL/ML IV SOLN COMPARISON:  None. FINDINGS: Bones/Joint/Cartilage Normal osseous marrow signal is seen throughout. No osseous fracture, marrow edema, or cortical destruction. No large joint effusions are seen. The articular surfaces are well maintained. Ligaments The visualized portions of the ligaments are intact. Muscles and Tendons The muscles surrounding the hand and wrist are normal in appearance without focal atrophy edema, or tear. There is a small amount of fluid seen surrounding extensor compartment 1 and 2 at the level of the wrist. The remainder of the extensor and flexor tendons are intact. Soft tissues Diffuse dorsal subcutaneous edema and non loculated fluid seen surrounding the wrist and hand. No loculated fluid collections are noted. No subcutaneous emphysema or areas of signal dropout. Overlying skin thickening noted.  IMPRESSION: Findings consistent with diffuse cellulitis and non loculated fluid/phlegmon at the level of the wrist. No definite evidence osteomyelitis or abscess formation. Mild extensor compartment 1 and 2 tenosynovitis Electronically Signed   By: Jonna ClarkBindu  Avutu M.D.   On: 04/10/2020 21:46   MR WRIST RIGHT W WO CONTRAST  Result Date: 04/10/2020 CLINICAL DATA:  Right hand swelling, IV drug abuse, question of infection EXAM: MRI OF THE RIGHT HAND WITHOUT AND WITH CONTRAST; MRI OF THE RIGHT WRIST WITHOUT AND WITH CONTRAST TECHNIQUE: Multiplanar, multisequence MR imaging of the right was performed before and after the administration of intravenous contrast. CONTRAST:  6mL GADAVIST GADOBUTROL 1 MMOL/ML IV SOLN COMPARISON:  None. FINDINGS: Bones/Joint/Cartilage Normal osseous marrow signal is seen throughout. No osseous fracture, marrow edema, or cortical destruction. No large joint effusions are seen. The articular surfaces are well maintained. Ligaments The visualized portions of the ligaments are intact. Muscles and Tendons The muscles surrounding the hand and wrist are normal in appearance without focal atrophy edema, or tear. There is a small amount of fluid seen surrounding extensor compartment 1 and 2 at the level of the wrist. The remainder of the extensor and flexor tendons are intact. Soft tissues Diffuse dorsal subcutaneous edema and non loculated fluid seen surrounding the wrist and hand. No loculated fluid collections are noted. No subcutaneous emphysema or areas of signal dropout. Overlying skin thickening noted. IMPRESSION: Findings consistent with diffuse cellulitis and non loculated fluid/phlegmon at the level of the wrist. No definite evidence osteomyelitis or abscess formation. Mild extensor compartment 1 and 2 tenosynovitis Electronically Signed   By: Jonna ClarkBindu  Avutu  M.D.   On: 04/10/2020 21:46   DG Hand Complete Right  Result Date: 04/10/2020 CLINICAL DATA:  29 year old male with right hand  swelling. EXAM: RIGHT HAND - COMPLETE 3+ VIEW COMPARISON:  None. FINDINGS: There is no acute fracture or dislocation. The bones are well mineralized. No arthritic changes. There is diffuse soft tissue swelling of the hand which may represent cellulitis. Clinical correlation is recommended. No radiopaque foreign object or soft tissue gas. IMPRESSION: 1. No acute osseous pathology. 2. Diffuse soft tissue swelling of the hand may represent cellulitis. Clinical correlation is recommended. Electronically Signed   By: Elgie Collard M.D.   On: 04/10/2020 02:22        Scheduled Meds: . dolutegravir  50 mg Oral Daily   And  . lamiVUDine  300 mg Oral Daily  . enoxaparin (LOVENOX) injection  40 mg Subcutaneous Q24H   Continuous Infusions: . ceFEPime (MAXIPIME) IV 2 g (04/11/20 1002)  . vancomycin 1,000 mg (04/11/20 1300)     LOS: 1 day    Time spent: 25 minutes    Alberteen Sam, MD [facility] 04/11/2020, 4:49 PM     Please page though AMION or Epic secure chat:  For [employer], Higher education careers adviser

## 2020-04-11 NOTE — Consult Note (Signed)
Reason for Consult:R Hand infection Referring Physician: Hospitalist  CC:My hand feels better  HPI:  Ethan Goodwin is an 29 y.o. right handed male who presents with R hand pain and swelling.  By history pt originally presented in June with similar symptoms, and represented 2d  With sepsis, still R hand pain and swelling.  Pt states hand pain and swelling better  .  Injected drugs in R hand. Pain is rated at   4 /10 and is described as dull.  Pain is intermittant.  Pain is made better by rest/immobilization, worse with motion.   Associated signs/symptoms: HIV Previous treatment:  Currently on Vanc/Cefepime  Past Medical History:  Diagnosis Date  . HIV disease (HCC) 10/20/2019  . 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 FACIAL COSMETIC SURGERY      No family history on file.  Social History:  reports that he has been smoking cigarettes. He has never used smokeless tobacco. He reports previous alcohol use. He reports current drug use. Drugs: IV and Methamphetamines.  Allergies: No Known Allergies  Medications: I have reviewed the patient's current medications.  Results for orders placed or performed during the hospital encounter of 04/10/20 (from the past 48 hour(s))  Lactic acid, plasma     Status: None   Collection Time: 04/10/20  1:19 AM  Result Value Ref Range   Lactic Acid, Venous 0.8 0.5 - 1.9 mmol/L    Comment: Performed at [facility], [address] [place], [place] [address]  Comprehensive metabolic panel     Status: Abnormal   Collection Time: 04/10/20  1:19 AM  Result Value Ref Range   Sodium 136 135 - 145 mmol/L   Potassium 3.4 (L) 3.5 - 5.1 mmol/L   Chloride 103 98 - 111 mmol/L   CO2 24 22 - 32 mmol/L   Glucose, Bld 107 (H) 70 - 99 mg/dL    Comment: Glucose reference range applies only to samples taken after fasting for at least 8 hours.   BUN 12 6 - 20 mg/dL   Creatinine, Ser 4.09 0.61 - 1.24 mg/dL    Calcium 8.9 8.9 - 81.1 mg/dL   Total Protein 7.8 6.5 - 8.1 g/dL   Albumin 3.9 3.5 - 5.0 g/dL   AST 20 15 - 41 U/L   ALT 19 0 - 44 U/L   Alkaline Phosphatase 75 38 - 126 U/L   Total Bilirubin 0.9 0.3 - 1.2 mg/dL   GFR calc non Af Amer >60 >60 mL/min   GFR calc Af Amer >60 >60 mL/min   Anion gap 9 5 - 15    Comment: Performed at [facility], [address] [place], [place] [address]  CBC WITH DIFFERENTIAL     Status: Abnormal   Collection Time: 04/10/20  1:19 AM  Result Value Ref Range   WBC 9.2 4.0 - 10.5 K/uL   RBC 3.91 (L) 4.22 - 5.81 MIL/uL   Hemoglobin 12.8 (L) 13.0 - 17.0 g/dL   HCT 29.5 (L) 39 - 52 %   MCV 97.7 80.0 - 100.0 fL   MCH 32.7 26.0 - 34.0 pg   MCHC 33.5 30.0 - 36.0 g/dL   RDW 62.1 30.8 - 65.7 %   Platelets 274 150 - 400 K/uL   nRBC 0.0 0.0 - 0.2 %   Neutrophils Relative % 65 %   Neutro Abs 5.9 1.7 - 7.7 K/uL   Lymphocytes Relative  25 %   Lymphs Abs 2.3 0.7 - 4.0 K/uL   Monocytes Relative 9 %   Monocytes Absolute 0.8 0 - 1 K/uL   Eosinophils Relative 1 %   Eosinophils Absolute 0.1 0 - 0 K/uL   Basophils Relative 0 %   Basophils Absolute 0.0 0 - 0 K/uL   Immature Granulocytes 0 %   Abs Immature Granulocytes 0.04 0.00 - 0.07 K/uL    Comment: Performed at [facility], [address] [place], [place] [address]  APTT     Status: None   Collection Time: 04/10/20  1:19 AM  Result Value Ref Range   aPTT 31 24 - 36 seconds    Comment: Performed at [facility], [address] [place], [place] [address]  Protime-INR     Status: None   Collection Time: 04/10/20  1:19 AM  Result Value Ref Range   Prothrombin Time 12.8 11.4 - 15.2 seconds   INR 1.0 0.8 - 1.2    Comment: (NOTE) INR goal varies based on device and disease states. Performed at [facility], [address] [place], [place] [address]   Blood Culture (routine x 2)     Status: None (Preliminary result)   Collection Time: 04/10/20  1:20 AM   Specimen:  BLOOD RIGHT ARM  Result Value Ref Range   Specimen Description      BLOOD RIGHT ARM Performed at [facility], [address] [place], [place] [address]    Special Requests      BOTTLES DRAWN AEROBIC AND ANAEROBIC Blood Culture adequate volume Performed at [facility], [address] [place], [place] [address]    Culture      NO GROWTH 1 DAY Performed at [facility], [address] [place], [place] [address]    Report Status PENDING   Urinalysis, Routine w reflex microscopic     Status: None   Collection Time: 04/10/20  1:47 AM  Result Value Ref Range   Color, Urine YELLOW YELLOW   APPearance CLEAR CLEAR   Specific Gravity, Urine 1.025 1.005 - 1.030   pH 6.5 5.0 - 8.0   Glucose, UA NEGATIVE NEGATIVE mg/dL   Hgb urine dipstick NEGATIVE NEGATIVE   Bilirubin Urine NEGATIVE NEGATIVE   Ketones, ur NEGATIVE NEGATIVE mg/dL   Protein, ur NEGATIVE NEGATIVE mg/dL   Nitrite NEGATIVE NEGATIVE   Leukocytes,Ua NEGATIVE NEGATIVE    Comment: Microscopic not done on urines with negative protein, blood, leukocytes, nitrite, or glucose < 500 mg/dL. Performed at [facility], [address] [place], [place] [address]   Urine culture     Status: None   Collection Time: 04/10/20  1:47 AM   Specimen: In/Out Cath Urine  Result Value Ref Range   Specimen Description      IN/OUT CATH URINE Performed at [facility], [address] [place], [place] [address]    Special Requests      NONE Performed at [facility], [address] [place], [place] [address]    Culture      NO GROWTH Performed at [facility], [address] [place], [place] [address]    Report Status 04/11/2020 FINAL   SARS Coronavirus 2 by RT PCR (hospital order, performed in [facility] hospital lab) Nasopharyngeal Nasopharyngeal Swab     Status: None   Collection Time: 04/10/20  1:47 AM   Specimen: Nasopharyngeal Swab  Result Value Ref Range   SARS  Coronavirus 2 NEGATIVE NEGATIVE    Comment: (NOTE) SARS-CoV-2 target nucleic acids are NOT DETECTED.  The SARS-CoV-2 RNA is generally detectable in upper and lower respiratory specimens during the acute phase of infection. The lowest concentration of SARS-CoV-2 viral copies this assay can detect is 250 copies / mL. A negative result does not preclude SARS-CoV-2 infection and should not be used as the sole basis for treatment or other patient management decisions.  A negative result may occur with improper specimen collection / handling, submission of specimen other than nasopharyngeal swab, presence of viral mutation(s) within the areas targeted by this assay, and inadequate number of viral copies (<250 copies / mL). A negative result must be combined with clinical observations, patient history, and epidemiological information.  Fact Sheet for Patients:   BoilerBrush.com.cy  Fact Sheet for Healthcare Providers: https://pope.com/  This test is not yet approved or  cleared by the [place] FDA and has been authorized for detection and/or diagnosis of SARS-CoV-2 by FDA under an Emergency Use Authorization (EUA).  This EUA will remain in effect (meaning this test can be used) for the duration of the COVID-19 declaration under Section 564(b)(1) of the Act, 21 U.S.C. section 360bbb-3(b)(1), unless the authorization is terminated or revoked sooner.  Performed at [facility], [address] [place], [place] [address]   Sedimentation rate     Status: Abnormal   Collection Time: 04/10/20 10:07 AM  Result Value Ref Range   Sed Rate 26 (H) 0 - 16 mm/hr    Comment: Performed at [facility], [address], [place], [place] [address]  C-reactive protein     Status: Abnormal   Collection Time: 04/10/20 10:07 AM  Result Value Ref Range   CRP 5.3 (H) <1.0 mg/dL    Comment: Performed at [facility], [address], [place], [place] [address]  CBC     Status: Abnormal   Collection Time: 04/10/20 10:07 AM  Result Value Ref Range   WBC 7.7 4.0 - 10.5 K/uL   RBC 4.00 (L) 4.22 - 5.81 MIL/uL   Hemoglobin 13.4 13.0 - 17.0 g/dL   HCT 47.6 39 - 52 %   MCV 99.5 80.0 - 100.0 fL   MCH 33.5 26.0 - 34.0 pg   MCHC 33.7 30.0 - 36.0 g/dL   RDW 54.6 50.3 - 54.6 %   Platelets 250 150 - 400 K/uL   nRBC 0.0 0.0 - 0.2 %    Comment: Performed at [facility], [address] [place], [place] [address]  Creatinine, serum     Status: None   Collection Time: 04/10/20 10:07 AM  Result Value Ref Range   Creatinine, Ser 0.86 0.61 - 1.24 mg/dL   GFR calc non Af Amer >60 >60 mL/min   GFR calc Af Amer >60 >60 mL/min    Comment: Performed at [facility], [address] [place], [place] [address]  Culture, blood (routine x 2)     Status: None (Preliminary result)   Collection Time: 04/10/20 11:01 AM   Specimen: BLOOD LEFT HAND  Result Value Ref Range   Specimen Description      BLOOD LEFT HAND Performed at [facility], [address] [place], [place] [address]    Special Requests      BOTTLES DRAWN AEROBIC AND ANAEROBIC Blood Culture adequate  volume Performed at [facility], [address] [place], [place] [address]    Culture      NO GROWTH < 24 HOURS Performed at [facility], [address], [place], [place] [address]    Report Status PENDING   Culture, blood (routine x 2)     Status: None (Preliminary result)   Collection Time: 04/10/20 11:04 AM   Specimen: BLOOD RIGHT ARM  Result Value Ref Range   Specimen Description      BLOOD RIGHT ARM Performed at [facility], [address], [place], [place] [address]    Special Requests      BOTTLES DRAWN AEROBIC AND ANAEROBIC Blood Culture adequate volume Performed at [facility], [address] [place],  [place] [address]    Culture      NO GROWTH < 24 HOURS Performed at [facility], [address], [place], [place] [address]    Report Status PENDING   T-helper cells (CD4) count (not at [facility])     Status: None   Collection Time: 04/10/20  4:48 PM  Result Value Ref Range   CD4 T Cell Abs 801 400 - 1,790 /uL   CD4 % Helper T Cell 41 33 - 65 %    Comment: Performed at [facility], [address] [place], [place] [address]  HIV-1 RNA quant-no reflex-bld     Status: None   Collection Time: 04/10/20  4:48 PM  Result Value Ref Range   HIV 1 RNA Quant 20 copies/mL    Comment: (NOTE) The reportable range for this assay is 20 to 10,000,000 copies HIV-1 RNA/mL.    LOG10 HIV-1 RNA 1.301 log10copy/mL    Comment: (NOTE) Performed At: [facility] [address] [place], [place] [id number] Jolene Schimke MD EZ:[id number]   RPR     Status: Abnormal   Collection Time: 04/10/20  4:48 PM  Result Value Ref Range   RPR Ser Ql Reactive (A) NON REACTIVE    Comment: SENT FOR CONFIRMATION   RPR Titer 1:2     Comment: Performed at [facility], [address], [place], [place] [address]  Hepatitis C antibody     Status: None   Collection Time: 04/10/20  4:48 PM  Result Value Ref Range   HCV Ab NON REACTIVE NON REACTIVE    Comment: (NOTE) Nonreactive HCV antibody screen is consistent with no HCV infections,  unless recent infection is suspected or other evidence exists to indicate HCV infection.  Performed at [facility], [address] [place], [place] [address]   Comprehensive metabolic panel     Status: Abnormal   Collection Time: 04/11/20  7:00 AM  Result Value Ref Range   Sodium 137 135 - 145 mmol/L   Potassium 4.2 3.5 - 5.1 mmol/L   Chloride 102 98 - 111 mmol/L   CO2 23 22 - 32 mmol/L   Glucose, Bld 114 (H) 70 - 99 mg/dL    Comment: Glucose reference range applies only to samples taken after fasting for at least 8 hours.   BUN 13 6 - 20 mg/dL    Creatinine, Ser 2.75 0.61 - 1.24 mg/dL   Calcium 9.0 8.9 - 17.0 mg/dL   Total Protein 7.4 6.5 - 8.1 g/dL   Albumin 3.5 3.5 - 5.0 g/dL   AST 15 15 - 41 U/L   ALT 17 0 - 44 U/L   Alkaline Phosphatase  67 38 - 126 U/L   Total Bilirubin 1.0 0.3 - 1.2 mg/dL   GFR calc non Af Amer >60 >60 mL/min   GFR calc Af Amer >60 >60 mL/min   Anion gap 12 5 - 15    Comment: Performed at [facility], [address], [place], [place] [address]  CBC     Status: Abnormal   Collection Time: 04/11/20  7:00 AM  Result Value Ref Range   WBC 6.3 4.0 - 10.5 K/uL   RBC 4.08 (L) 4.22 - 5.81 MIL/uL   Hemoglobin 13.2 13.0 - 17.0 g/dL   HCT 69.4 39 - 52 %   MCV 99.8 80.0 - 100.0 fL   MCH 32.4 26.0 - 34.0 pg   MCHC 32.4 30.0 - 36.0 g/dL   RDW 85.4 62.7 - 03.5 %   Platelets 230 150 - 400 K/uL   nRBC 0.0 0.0 - 0.2 %    Comment: Performed at [facility], [address], [place], [place] [address]    DG Chest 2 View  Result Date: 04/10/2020 CLINICAL DATA:  29 year old male with hand pain and swelling, abnormal labs. EXAM: CHEST - 2 VIEW COMPARISON:  None. FINDINGS: Lung volumes and mediastinal contours are within normal limits. Visualized tracheal air column is within normal limits. Borderline to mild bilateral perihilar increased interstitial markings, but otherwise both lungs appear clear. No pneumothorax or pleural effusion. Negative visible bowel gas pattern and osseous structures. IMPRESSION: No acute cardiopulmonary abnormality. Electronically Signed   By: Odessa Fleming M.D.   On: 04/10/2020 02:23   MR HAND RIGHT W WO CONTRAST  Result Date: 04/10/2020 CLINICAL DATA:  Right hand swelling, IV drug abuse, question of infection EXAM: MRI OF THE RIGHT HAND WITHOUT AND WITH CONTRAST; MRI OF THE RIGHT WRIST WITHOUT AND WITH CONTRAST TECHNIQUE: Multiplanar, multisequence MR imaging of the right was performed before and after the administration of intravenous contrast. CONTRAST:  79mL  GADAVIST GADOBUTROL 1 MMOL/ML IV SOLN COMPARISON:  None. FINDINGS: Bones/Joint/Cartilage Normal osseous marrow signal is seen throughout. No osseous fracture, marrow edema, or cortical destruction. No large joint effusions are seen. The articular surfaces are well maintained. Ligaments The visualized portions of the ligaments are intact. Muscles and Tendons The muscles surrounding the hand and wrist are normal in appearance without focal atrophy edema, or tear. There is a small amount of fluid seen surrounding extensor compartment 1 and 2 at the level of the wrist. The remainder of the extensor and flexor tendons are intact. Soft tissues Diffuse dorsal subcutaneous edema and non loculated fluid seen surrounding the wrist and hand. No loculated fluid collections are noted. No subcutaneous emphysema or areas of signal dropout. Overlying skin thickening noted. IMPRESSION: Findings consistent with diffuse cellulitis and non loculated fluid/phlegmon at the level of the wrist. No definite evidence osteomyelitis or abscess formation. Mild extensor compartment 1 and 2 tenosynovitis Electronically Signed   By: Jonna Clark M.D.   On: 04/10/2020 21:46   MR WRIST RIGHT W WO CONTRAST  Result Date: 04/10/2020 CLINICAL DATA:  Right hand swelling, IV drug abuse, question of infection EXAM: MRI OF THE RIGHT HAND WITHOUT AND WITH CONTRAST; MRI OF THE RIGHT WRIST WITHOUT AND WITH CONTRAST TECHNIQUE: Multiplanar, multisequence MR imaging of the right was performed before and after the administration of intravenous contrast. CONTRAST:  34mL GADAVIST GADOBUTROL 1 MMOL/ML IV SOLN COMPARISON:  None. FINDINGS: Bones/Joint/Cartilage Normal osseous marrow signal is seen throughout. No osseous fracture, marrow edema, or  cortical destruction. No large joint effusions are seen. The articular surfaces are well maintained. Ligaments The visualized portions of the ligaments are intact. Muscles and Tendons The muscles surrounding the hand and  wrist are normal in appearance without focal atrophy edema, or tear. There is a small amount of fluid seen surrounding extensor compartment 1 and 2 at the level of the wrist. The remainder of the extensor and flexor tendons are intact. Soft tissues Diffuse dorsal subcutaneous edema and non loculated fluid seen surrounding the wrist and hand. No loculated fluid collections are noted. No subcutaneous emphysema or areas of signal dropout. Overlying skin thickening noted. IMPRESSION: Findings consistent with diffuse cellulitis and non loculated fluid/phlegmon at the level of the wrist. No definite evidence osteomyelitis or abscess formation. Mild extensor compartment 1 and 2 tenosynovitis Electronically Signed   By: Jonna ClarkBindu  Avutu M.D.   On: 04/10/2020 21:46   DG Hand Complete Right  Result Date: 04/10/2020 CLINICAL DATA:  [age] year old male with right hand swelling. EXAM: RIGHT HAND - COMPLETE 3+ VIEW COMPARISON:  None. FINDINGS: There is no acute fracture or dislocation. The bones are well mineralized. No arthritic changes. There is diffuse soft tissue swelling of the hand which may represent cellulitis. Clinical correlation is recommended. No radiopaque foreign object or soft tissue gas. IMPRESSION: 1. No acute osseous pathology. 2. Diffuse soft tissue swelling of the hand may represent cellulitis. Clinical correlation is recommended. Electronically Signed   By: Elgie CollardArash  Radparvar M.D.   On: 04/10/2020 02:22    Pertinent items are noted in HPI. Temp:  [97.9 F (36.6 C)-98.6 F (37 C)] 98.6 F (37 C) (07/13 1302) Pulse Rate:  [83-93] 86 (07/13 1302) Resp:  [14-19] 16 (07/13 1302) BP: (107-117)/(68-77) 107/68 (07/13 1302) SpO2:  [98 %-100 %] 98 % (07/13 1302) Weight:  [61.8 kg] 61.8 kg (07/12 2220) General appearance: alert and cooperative Resp: clear to auscultation bilaterally Cardio: regular rate and rhythm Extremities: R hand with some dorsal hand swelling, warmth, better per patient.  I area between  thumb and index finger metacarpal that looks like site of injection, that is tender to palpation, however no pointing of the skin or fluctuance suggesting drainable abscess.  Another area alonge 1st dorsal compartment where there is evidence of previous injection  - again, no fluctuant area to suggest drainable abscess; no other concerns    Assessment: Cellulitis from IV injection - per MRI and exam, no drainable abscess Plan: Cont IV abx and oral per ID, if areas on hand develop abscess will need I&D.   Ethan Goodwin 04/11/2020, 4:56 PM

## 2020-04-12 LAB — CBC
HCT: 39 % (ref 39.0–52.0)
Hemoglobin: 12.9 g/dL — ABNORMAL LOW (ref 13.0–17.0)
MCH: 32.6 pg (ref 26.0–34.0)
MCHC: 33.1 g/dL (ref 30.0–36.0)
MCV: 98.5 fL (ref 80.0–100.0)
Platelets: 248 10*3/uL (ref 150–400)
RBC: 3.96 MIL/uL — ABNORMAL LOW (ref 4.22–5.81)
RDW: 12.6 % (ref 11.5–15.5)
WBC: 6.6 10*3/uL (ref 4.0–10.5)
nRBC: 0 % (ref 0.0–0.2)

## 2020-04-12 LAB — COMPREHENSIVE METABOLIC PANEL
ALT: 14 U/L (ref 0–44)
AST: 17 U/L (ref 15–41)
Albumin: 3.5 g/dL (ref 3.5–5.0)
Alkaline Phosphatase: 69 U/L (ref 38–126)
Anion gap: 11 (ref 5–15)
BUN: 12 mg/dL (ref 6–20)
CO2: 23 mmol/L (ref 22–32)
Calcium: 8.7 mg/dL — ABNORMAL LOW (ref 8.9–10.3)
Chloride: 102 mmol/L (ref 98–111)
Creatinine, Ser: 1.02 mg/dL (ref 0.61–1.24)
GFR calc Af Amer: >60 mL/min (ref >60)
GFR calc non Af Amer: >60 mL/min (ref >60)
Glucose, Bld: 122 mg/dL — ABNORMAL HIGH (ref 70–99)
Potassium: 3.6 mmol/L (ref 3.5–5.1)
Sodium: 136 mmol/L (ref 135–145)
Total Bilirubin: 0.9 mg/dL (ref 0.3–1.2)
Total Protein: 7.5 g/dL (ref 6.5–8.1)

## 2020-04-12 LAB — T.PALLIDUM AB, TOTAL: T Pallidum Abs: REACTIVE — AB

## 2020-04-12 MED ORDER — SULFAMETHOXAZOLE-TRIMETHOPRIM 800-160 MG PO TABS: 1.0000 | ORAL_TABLET | Freq: Two times a day (BID) | ORAL | 0 refills | Status: AC

## 2020-04-12 MED ORDER — SULFAMETHOXAZOLE-TRIMETHOPRIM 800-160 MG PO TABS
1.0000 | ORAL_TABLET | Freq: Two times a day (BID) | ORAL | Status: DC
  Administered 2020-04-12: 1 via ORAL
  Filled 2020-04-12: qty 1

## 2020-04-12 MED ORDER — NICOTINE 14 MG/24HR TD PT24: 14.0000 mg | MEDICATED_PATCH | Freq: Every day | TRANSDERMAL | 0 refills | Status: DC | PRN

## 2020-04-12 NOTE — Discharge Instructions (Signed)

## 2020-04-12 NOTE — TOC Initial Note (Signed)
Transition of Care [facility]) - Initial/Assessment Note    Patient Details  Name: Ethan Goodwin MRN: [id number] Date of Birth: 06/21/1991  Transition of Care [facility]) CM/SW Contact:    Lynnell Catalan, RN Phone Number: 04/12/2020, 10:56 AM  Clinical Narrative:                 This CM met with pt at bedside for dc planning. Pt is connected with the [facility] for 042 management. Pt states he is homeless at this time. Pt given multiple resources for homeless shelters. Pt instructed to call [facility] to see if any shelter beds are available today. He was provided the number. He states he will do this. Pt to dc home on Bactrim. This CM had pt download [place] app on his phone and we looked up price of Bactrim together. Price for Bactrim on [employer] was $8. This CM asked pt if he would like to use the [facility] program or if he could afford the $8. Pt states he can afford the $8. Pt also provided with 2 bus passes so he can get to the pharmacy.  Expected Discharge Plan: Home/Self Care Barriers to Discharge: No Barriers Identified   Expected Discharge Plan and Services Expected Discharge Plan: Home/Self Care   Discharge Planning Services: Medication Assistance, Other - See comment   Living arrangements for the past 2 months: Homeless Expected Discharge Date: 04/12/20                                    Prior Living Arrangements/Services Living arrangements for the past 2 months: Homeless                     Activities of Daily Living Home Assistive Devices/Equipment: None ADL Screening (condition at time of admission) Patient's cognitive ability adequate to safely complete daily activities?: Yes Is the patient deaf or have difficulty hearing?: No Does the patient have difficulty seeing, even when wearing glasses/contacts?: No Does the patient have difficulty concentrating, remembering, or making decisions?: No Patient able to express need for assistance with ADLs?:  Yes Does the patient have difficulty dressing or bathing?: No Independently performs ADLs?: Yes (appropriate for developmental age) Does the patient have difficulty walking or climbing stairs?: No Weakness of Legs: None Weakness of Arms/Hands: Right (cellulitis of right hand)  Permission Sought/Granted                  Emotional Assessment Appearance:: Appears stated age Attitude/Demeanor/Rapport: Engaged Affect (typically observed): Stable Orientation: : Oriented to Self, Oriented to Place, Oriented to  Time, Oriented to Situation Alcohol / Substance Use: Illicit Drugs    Admission diagnosis:  IVDU (intravenous drug user) [F19.90] Cellulitis of right hand [L03.113] Positive blood cultures [R78.81] Bacteremia [R78.81] Patient Active Problem List   Diagnosis Date Noted  . Cellulitis of right hand 04/10/2020  . Positive blood cultures 04/10/2020  . IVDU (intravenous drug user)   . Acute HIV infection syndrome ([place]) 10/20/2019  . 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

## 2020-04-13 NOTE — Discharge Summary (Signed)
[facility] Discharge Summary   Patient: Ethan Goodwin NGE:[id number]  PCP: Daiva Eves, Lisette Grinder, MD  Date of admission: 04/10/2020   Date of discharge: 04/12/2020      Discharge Diagnoses:  Principal diagnosis Right hand cellulitis   Active Problems:   Cellulitis of right hand   Positive blood cultures   IVDU (intravenous drug user)   Admitted From: home Disposition:  Home   Recommendations for Outpatient Follow-up:  1. PCP: please follow up in 1 week 2. Follow up LABS/TEST:  none   Follow-up Information    Daiva Eves, Lisette Grinder, MD. Schedule an appointment as soon as possible for a visit in 1 week(s).   Specialty: Infectious Diseases Contact information: [address] [place] [phone]              Diet recommendation: Regular diet  Activity: The patient is advised to gradually reintroduce usual activities, as tolerated  Discharge Condition: stable  Code Status: Full code   History of present illness: As per the H and P dictated on admission, "Ethan Goodwin is a 29 y.o. male with medical history significant of HIV on dovato, syphillis, and IVDU (methamphetamine) who follows with ID as an outpatient who presents after a follow up call from the ED regarding a positive blood culture.  He notes he's here for antibiotic therapy.  He presented to the ED on 6/24 with pain and swelling of his right hand, he acknowledged IVDU earlier that day to the hand.  He was discharged with keflex.  Unclear when he was called with these results, but he presented today for antibiotic therapy.  He notes continued swelling in R hand.  He notes this "just started yesterday".  He notes recent IVDU to this hand again recently (he notes day prior to yesterday he used meth and he thinks swelling is related to that).  He denies fevers, chills, cough.  Denies etoh.  Acknowledges meth (IV) and tobacco use."  Hospital Course:  Summary of his active problems in the  hospital is as following. Wrist cellulitis Extensor tenosynovitis This seems to be improving per patient.   Finger movement is not overly painful. Treated with vancomycin, switch to bactrim Consulted ID, appreciate assistance Consulted Dr. Izora Ribas from orthopedics,no intervention needed, appreciate assistance.   Recent Enterobacter cloacae bacteremia Continue antibiotics as above  HIV The viral load is low, CD4 is good.   Hep C negative. Continue dolutegravir, lamivudine  Syphilis resolved Treated in -past Titers low  Hypokalemia Resolved  Patient was ambulatory without any assistance. On the day of the discharge the patient's vitals were stable, and no other acute medical condition were reported by patient. the patient was felt safe to be discharge at Home with no therapy needed on discharge.  Consultants: ID, Orthopedics  Procedures: none  Discharge Exam: General: Appear in no distress, no Rash, right hand nodules; Oral Mucosa Clear, moist. Cardiovascular: S1 and S2 Present, no Murmur, Respiratory: normal respiratory effort, Bilateral Air entry present and no Crackles, no wheezes Abdomen: Bowel Sound present, Soft and no tenderness, no hernia Extremities: no Pedal edema, no calf tenderness Neurology: alert and oriented to time, place, and person affect appropriate.  Filed Weights   04/09/20 2345 04/10/20 2220 04/12/20 0500  Weight: 61.9 kg 61.8 kg 61.8 kg   Vitals:   04/11/20 2144 04/12/20 0637  BP: 113/75 100/62  Pulse: 78 81  Resp: 16 14  Temp: 98.1 F (36.7 C) 98.6 F (37 C)  SpO2:  100% 100%    DISCHARGE MEDICATION: Allergies as of 04/12/2020   No Known Allergies     Medication List    STOP taking these medications   cephALEXin 500 MG capsule Commonly known as: KEFLEX     TAKE these medications   Dovato 50-300 MG Tabs Generic drug: Dolutegravir-lamiVUDine Take 1 tablet by mouth daily.   nicotine 14 mg/24hr patch Commonly known as: NICODERM  CQ - dosed in mg/24 hours Place 1 patch (14 mg total) onto the skin daily as needed (nicotine withdrawal).   sulfamethoxazole-trimethoprim 800-160 MG tablet Commonly known as: BACTRIM DS Take 1 tablet by mouth 2 (two) times daily for 7 days.      No Known Allergies Discharge Instructions    Diet - low sodium heart healthy   Complete by: As directed    Increase activity slowly   Complete by: As directed       The results of significant diagnostics from this hospitalization (including imaging, microbiology, ancillary and laboratory) are listed below for reference.    Significant Diagnostic Studies: DG Chest 2 View  Result Date: 04/10/2020 CLINICAL DATA:  29 year old male with hand pain and swelling, abnormal labs. EXAM: CHEST - 2 VIEW COMPARISON:  None. FINDINGS: Lung volumes and mediastinal contours are within normal limits. Visualized tracheal air column is within normal limits. Borderline to mild bilateral perihilar increased interstitial markings, but otherwise both lungs appear clear. No pneumothorax or pleural effusion. Negative visible bowel gas pattern and osseous structures. IMPRESSION: No acute cardiopulmonary abnormality. Electronically Signed   By: Odessa Fleming M.D.   On: 04/10/2020 02:23   MR HAND RIGHT W WO CONTRAST  Result Date: 04/10/2020 CLINICAL DATA:  Right hand swelling, IV drug abuse, question of infection EXAM: MRI OF THE RIGHT HAND WITHOUT AND WITH CONTRAST; MRI OF THE RIGHT WRIST WITHOUT AND WITH CONTRAST TECHNIQUE: Multiplanar, multisequence MR imaging of the right was performed before and after the administration of intravenous contrast. CONTRAST:  6mL GADAVIST GADOBUTROL 1 MMOL/ML IV SOLN COMPARISON:  None. FINDINGS: Bones/Joint/Cartilage Normal osseous marrow signal is seen throughout. No osseous fracture, marrow edema, or cortical destruction. No large joint effusions are seen. The articular surfaces are well maintained. Ligaments The visualized portions of the  ligaments are intact. Muscles and Tendons The muscles surrounding the hand and wrist are normal in appearance without focal atrophy edema, or tear. There is a small amount of fluid seen surrounding extensor compartment 1 and 2 at the level of the wrist. The remainder of the extensor and flexor tendons are intact. Soft tissues Diffuse dorsal subcutaneous edema and non loculated fluid seen surrounding the wrist and hand. No loculated fluid collections are noted. No subcutaneous emphysema or areas of signal dropout. Overlying skin thickening noted. IMPRESSION: Findings consistent with diffuse cellulitis and non loculated fluid/phlegmon at the level of the wrist. No definite evidence osteomyelitis or abscess formation. Mild extensor compartment 1 and 2 tenosynovitis Electronically Signed   By: Jonna Clark M.D.   On: 04/10/2020 21:46   MR WRIST RIGHT W WO CONTRAST  Result Date: 04/10/2020 CLINICAL DATA:  Right hand swelling, IV drug abuse, question of infection EXAM: MRI OF THE RIGHT HAND WITHOUT AND WITH CONTRAST; MRI OF THE RIGHT WRIST WITHOUT AND WITH CONTRAST TECHNIQUE: Multiplanar, multisequence MR imaging of the right was performed before and after the administration of intravenous contrast. CONTRAST:  6mL GADAVIST GADOBUTROL 1 MMOL/ML IV SOLN COMPARISON:  None. FINDINGS: Bones/Joint/Cartilage Normal osseous marrow signal is seen throughout. No osseous fracture,  marrow edema, or cortical destruction. No large joint effusions are seen. The articular surfaces are well maintained. Ligaments The visualized portions of the ligaments are intact. Muscles and Tendons The muscles surrounding the hand and wrist are normal in appearance without focal atrophy edema, or tear. There is a small amount of fluid seen surrounding extensor compartment 1 and 2 at the level of the wrist. The remainder of the extensor and flexor tendons are intact. Soft tissues Diffuse dorsal subcutaneous edema and non loculated fluid seen  surrounding the wrist and hand. No loculated fluid collections are noted. No subcutaneous emphysema or areas of signal dropout. Overlying skin thickening noted. IMPRESSION: Findings consistent with diffuse cellulitis and non loculated fluid/phlegmon at the level of the wrist. No definite evidence osteomyelitis or abscess formation. Mild extensor compartment 1 and 2 tenosynovitis Electronically Signed   By: Jonna ClarkBindu  Avutu M.D.   On: 04/10/2020 21:46   DG Hand Complete Right  Result Date: 04/10/2020 CLINICAL DATA:  29 year old male with right hand swelling. EXAM: RIGHT HAND - COMPLETE 3+ VIEW COMPARISON:  None. FINDINGS: There is no acute fracture or dislocation. The bones are well mineralized. No arthritic changes. There is diffuse soft tissue swelling of the hand which may represent cellulitis. Clinical correlation is recommended. No radiopaque foreign object or soft tissue gas. IMPRESSION: 1. No acute osseous pathology. 2. Diffuse soft tissue swelling of the hand may represent cellulitis. Clinical correlation is recommended. Electronically Signed   By: Elgie CollardArash  Radparvar M.D.   On: 04/10/2020 02:22    Microbiology: Recent Results (from the past 240 hour(s))  Blood Culture (routine x 2)     Status: None (Preliminary result)   Collection Time: 04/10/20  1:20 AM   Specimen: BLOOD RIGHT ARM  Result Value Ref Range Status   Specimen Description   Final    BLOOD RIGHT ARM Performed at [facility] [place], [address] [place], [place] [address]    Special Requests   Final    BOTTLES DRAWN AEROBIC AND ANAEROBIC Blood Culture adequate volume Performed at [facility] [place], [address] [place], [place] [address]    Culture   Final    NO GROWTH 2 DAYS Performed at [facility], [address] [place], [place] [address]    Report Status PENDING  Incomplete  Urine culture     Status: None   Collection Time: 04/10/20  1:47 AM   Specimen: In/Out Cath Urine  Result Value Ref Range  Status   Specimen Description   Final    IN/OUT CATH URINE Performed at [facility] [place], [address] [place], [place] [address]    Special Requests   Final    NONE Performed at [facility] [place], [address] [place], [place] [address]    Culture   Final    NO GROWTH Performed at [facility], [address] [place], [place] [address]    Report Status 04/11/2020 FINAL  Final  SARS Coronavirus 2 by RT PCR (hospital order, performed in [facility] lab) Nasopharyngeal Nasopharyngeal Swab     Status: None   Collection Time: 04/10/20  1:47 AM   Specimen: Nasopharyngeal Swab  Result Value Ref Range Status   SARS Coronavirus 2 NEGATIVE NEGATIVE Final    Comment: (NOTE) SARS-CoV-2 target nucleic acids are NOT DETECTED.  The SARS-CoV-2 RNA is generally detectable in upper and lower respiratory specimens during the acute phase of infection.  The lowest concentration of SARS-CoV-2 viral copies this assay can detect is 250 copies / mL. A negative result does not preclude SARS-CoV-2 infection and should not be used as the sole basis for treatment or other patient management decisions.  A negative result may occur with improper specimen collection / handling, submission of specimen other than nasopharyngeal swab, presence of viral mutation(s) within the areas targeted by this assay, and inadequate number of viral copies (<250 copies / mL). A negative result must be combined with clinical observations, patient history, and epidemiological information.  Fact Sheet for Patients:   BoilerBrush.com.cy  Fact Sheet for Healthcare Providers: https://pope.com/  This test is not yet approved or  cleared by the [place] FDA and has been authorized for detection and/or diagnosis of SARS-CoV-2 by FDA under an Emergency Use Authorization (EUA).  This EUA will remain in effect (meaning this test can be used) for the  duration of the COVID-19 declaration under Section 564(b)(1) of the Act, 21 U.S.C. section 360bbb-3(b)(1), unless the authorization is terminated or revoked sooner.  Performed at [facility], [address] [place], [place] [address]   Culture, blood (routine x 2)     Status: None (Preliminary result)   Collection Time: 04/10/20 11:01 AM   Specimen: BLOOD LEFT HAND  Result Value Ref Range Status   Specimen Description   Final    BLOOD LEFT HAND Performed at [facility], [address], [place], [place] [address]    Special Requests   Final    BOTTLES DRAWN AEROBIC AND ANAEROBIC Blood Culture adequate volume Performed at [facility], [address] [place], [place] [address]    Culture   Final    NO GROWTH 2 DAYS Performed at [facility], [address], [place], [place] [address]    Report Status PENDING  Incomplete  Culture, blood (routine x 2)     Status: None (Preliminary result)   Collection Time: 04/10/20 11:04 AM   Specimen: BLOOD RIGHT ARM  Result Value Ref Range Status   Specimen Description   Final    BLOOD RIGHT ARM Performed at [facility], [address] [place], [place] [address]    Special Requests   Final    BOTTLES DRAWN AEROBIC AND ANAEROBIC Blood Culture adequate volume Performed at [facility], [address], [place], [place] [address]    Culture   Final    NO GROWTH 2 DAYS Performed at [facility], [address], [place], [place] [address]    Report Status PENDING  Incomplete     Labs: CBC: Recent Labs  Lab 04/10/20 0119 04/10/20 1007 04/11/20 0700 04/12/20 0647  WBC 9.2 7.7 6.3 6.6  NEUTROABS 5.9  --   --   --   HGB 12.8* 13.4 13.2 12.9*  HCT 38.2* 39.8 40.7 39.0  MCV 97.7 99.5 99.8 98.5  PLT 274 250 230 248   Basic Metabolic Panel: Recent Labs  Lab 04/10/20 0119 04/10/20 1007 04/11/20 0700 04/12/20 0647  NA 136  --  137  136  K 3.4*  --  4.2 3.6  CL 103  --  102 102  CO2 24  --  23 23  GLUCOSE 107*  --  114* 122*  BUN 12  --  13 12  CREATININE 0.98 0.86 0.99 1.02  CALCIUM 8.9  --  9.0 8.7*   Liver Function Tests: Recent Labs  Lab  04/10/20 0119 04/11/20 0700 04/12/20 0647  AST 20 15 17   ALT 19 17 14   ALKPHOS 75 67 69  BILITOT 0.9 1.0 0.9  PROT 7.8 7.4 7.5  ALBUMIN 3.9 3.5 3.5   No results for input(s): LIPASE, AMYLASE in the last 168 hours. No results for input(s): AMMONIA in the last 168 hours. Cardiac Enzymes: No results for input(s): CKTOTAL, CKMB, CKMBINDEX, TROPONINI in the last 168 hours. BNP (last 3 results) No results for input(s): BNP in the last 8760 hours. CBG: No results for input(s): GLUCAP in the last 168 hours.  Time spent: 35 minutes  Signed:   [facility] 04/12/2020 7:52 AM

## 2020-04-15 LAB — CULTURE, BLOOD (ROUTINE X 2)
Culture: NO GROWTH
Culture: NO GROWTH
Culture: NO GROWTH
Special Requests: ADEQUATE
Special Requests: ADEQUATE
Special Requests: ADEQUATE

## 2020-07-26 ENCOUNTER — Other Ambulatory Visit: Payer: Self-pay | Admitting: *Deleted

## 2020-07-26 ENCOUNTER — Other Ambulatory Visit: Payer: Self-pay

## 2020-07-26 ENCOUNTER — Other Ambulatory Visit: Payer: Self-pay | Admitting: Infectious Disease

## 2020-07-26 ENCOUNTER — Ambulatory Visit: Payer: Self-pay

## 2020-07-26 DIAGNOSIS — B20 Human immunodeficiency virus [HIV] disease: Secondary | ICD-10-CM

## 2020-07-27 ENCOUNTER — Encounter: Payer: Self-pay | Admitting: Infectious Disease

## 2020-07-27 LAB — T-HELPER CELL (CD4) - (RCID CLINIC ONLY)
CD4 % Helper T Cell: 42 % (ref 33–65)
CD4 T Cell Abs: 978 /uL (ref 400–1790)

## 2020-07-31 LAB — CBC WITH DIFFERENTIAL/PLATELET
Absolute Monocytes: 531 cells/uL (ref 200–950)
Basophils Absolute: 28 cells/uL (ref 0–200)
Basophils Relative: 0.4 %
Eosinophils Absolute: 62 cells/uL (ref 15–500)
Eosinophils Relative: 0.9 %
HCT: 42.3 % (ref 38.5–50.0)
Hemoglobin: 14.2 g/dL (ref 13.2–17.1)
Lymphs Abs: 2539 cells/uL (ref 850–3900)
MCH: 32.5 pg (ref 27.0–33.0)
MCHC: 33.6 g/dL (ref 32.0–36.0)
MCV: 96.8 fL (ref 80.0–100.0)
MPV: 10.6 fL (ref 7.5–12.5)
Monocytes Relative: 7.7 %
Neutro Abs: 3740 cells/uL (ref 1500–7800)
Neutrophils Relative %: 54.2 %
Platelets: 181 10*3/uL (ref 140–400)
RBC: 4.37 10*6/uL (ref 4.20–5.80)
RDW: 12.4 % (ref 11.0–15.0)
Total Lymphocyte: 36.8 %
WBC: 6.9 10*3/uL (ref 3.8–10.8)

## 2020-07-31 LAB — COMPLETE METABOLIC PANEL WITH GFR
AG Ratio: 1.1 (calc) (ref 1.0–2.5)
ALT: 17 U/L (ref 9–46)
AST: 28 U/L (ref 10–40)
Albumin: 4.2 g/dL (ref 3.6–5.1)
Alkaline phosphatase (APISO): 73 U/L (ref 36–130)
BUN: 14 mg/dL (ref 7–25)
CO2: 24 mmol/L (ref 20–32)
Calcium: 9.6 mg/dL (ref 8.6–10.3)
Chloride: 105 mmol/L (ref 98–110)
Creat: 1.05 mg/dL (ref 0.60–1.35)
GFR, Est African American: 110 mL/min/{1.73_m2} (ref >=60)
GFR, Est Non African American: 95 mL/min/{1.73_m2} (ref >=60)
Globulin: 3.7 g/dL (calc) (ref 1.9–3.7)
Glucose, Bld: 97 mg/dL (ref 65–99)
Potassium: 3.7 mmol/L (ref 3.5–5.3)
Sodium: 139 mmol/L (ref 135–146)
Total Bilirubin: 0.8 mg/dL (ref 0.2–1.2)
Total Protein: 7.9 g/dL (ref 6.1–8.1)

## 2020-07-31 LAB — HIV-1 RNA QUANT-NO REFLEX-BLD
HIV 1 RNA Quant: <20 Copies/mL — ABNORMAL HIGH
HIV-1 RNA Quant, Log: <1.3 Log cps/mL — ABNORMAL HIGH

## 2020-08-14 ENCOUNTER — Encounter: Payer: Self-pay | Admitting: Infectious Disease

## 2020-10-25 ENCOUNTER — Other Ambulatory Visit: Payer: Self-pay | Admitting: Infectious Disease

## 2020-10-31 ENCOUNTER — Other Ambulatory Visit: Payer: Self-pay | Admitting: Infectious Disease

## 2020-11-13 ENCOUNTER — Telehealth: Payer: Self-pay

## 2020-11-13 NOTE — Telephone Encounter (Signed)
Left patient a voice mail to call back to reschedule lab and office visit with Dr. Daiva Eves

## 2020-11-14 ENCOUNTER — Telehealth: Payer: Self-pay

## 2020-11-14 NOTE — Telephone Encounter (Signed)
Called patient to get an appointment scheduled, left a voicemail for patient to call back and get it scheduled

## 2020-12-05 ENCOUNTER — Telehealth: Payer: Self-pay

## 2020-12-05 ENCOUNTER — Other Ambulatory Visit: Payer: Self-pay | Admitting: Infectious Disease

## 2020-12-05 NOTE — Telephone Encounter (Signed)
I have contacted the patient in regards to getting his scheduled for a follow up visit and we had also received a refill request for his Dovato. Patient has been scheduled for 01/03/21 and patient stated he has enough Dovato to make it until his appointment. Ethan Goodwin

## 2020-12-06 ENCOUNTER — Other Ambulatory Visit: Payer: Self-pay

## 2020-12-06 ENCOUNTER — Ambulatory Visit: Payer: Self-pay

## 2020-12-19 ENCOUNTER — Other Ambulatory Visit: Payer: Self-pay | Admitting: Infectious Disease

## 2021-01-03 ENCOUNTER — Telehealth: Payer: Self-pay

## 2021-01-03 ENCOUNTER — Ambulatory Visit: Payer: Self-pay | Admitting: Infectious Disease

## 2021-01-03 NOTE — Telephone Encounter (Signed)
Called patient regarding appointment for 1:45. Patient has multiple no shows with our office and is overdue for a visit with a provider. Per MD okay to provide patient one refill, but will need to have labs done for review.  Not able to reach patient at this time. Left voicemail requesting call back. Okay to offer video visit if patient has labs done.

## 2021-01-25 ENCOUNTER — Other Ambulatory Visit: Payer: Self-pay | Admitting: Infectious Disease

## 2021-01-25 ENCOUNTER — Telehealth: Payer: Self-pay

## 2021-01-25 NOTE — Telephone Encounter (Signed)
We received refill request for patient's Dovato. I have tried to contact patient in regards to him being past due for a follow up and to renew his ADAP. Patient did not answer and no secured voicemail setup. Medication request has been refused. Avrey Hyser T Pricilla Loveless

## 2021-02-06 ENCOUNTER — Other Ambulatory Visit: Payer: Self-pay | Admitting: Infectious Disease

## 2021-03-05 ENCOUNTER — Ambulatory Visit: Payer: Self-pay

## 2021-03-05 ENCOUNTER — Ambulatory Visit: Payer: Self-pay | Admitting: Infectious Disease

## 2021-03-12 ENCOUNTER — Telehealth: Payer: Self-pay

## 2021-03-12 NOTE — Telephone Encounter (Signed)
Called patient to get an appointment scheduled, left a voicemail for patient to call back and get scheduled.. May need transportation as well

## 2021-03-29 ENCOUNTER — Ambulatory Visit (INDEPENDENT_AMBULATORY_CARE_PROVIDER_SITE_OTHER): Payer: Self-pay | Admitting: Infectious Diseases

## 2021-03-29 ENCOUNTER — Other Ambulatory Visit: Payer: Self-pay

## 2021-03-29 ENCOUNTER — Encounter: Payer: Self-pay | Admitting: Pharmacist

## 2021-03-29 ENCOUNTER — Encounter: Payer: Self-pay | Admitting: Infectious Diseases

## 2021-03-29 ENCOUNTER — Ambulatory Visit: Payer: Self-pay

## 2021-03-29 VITALS — BP 109/77 | HR 96 | Temp 97.6°F | Wt 135.0 lb

## 2021-03-29 DIAGNOSIS — B20 Human immunodeficiency virus [HIV] disease: Secondary | ICD-10-CM

## 2021-03-29 DIAGNOSIS — L02413 Cutaneous abscess of right upper limb: Secondary | ICD-10-CM

## 2021-03-29 DIAGNOSIS — Z79899 Other long term (current) drug therapy: Secondary | ICD-10-CM

## 2021-03-29 DIAGNOSIS — F1990 Other psychoactive substance use, unspecified, uncomplicated: Secondary | ICD-10-CM

## 2021-03-29 DIAGNOSIS — Z113 Encounter for screening for infections with a predominantly sexual mode of transmission: Secondary | ICD-10-CM

## 2021-03-29 DIAGNOSIS — L03113 Cellulitis of right upper limb: Secondary | ICD-10-CM

## 2021-03-29 MED ORDER — SULFAMETHOXAZOLE-TRIMETHOPRIM 800-160 MG PO TABS: 1.0000 | ORAL_TABLET | Freq: Two times a day (BID) | ORAL | 1 refills | Status: DC

## 2021-03-29 MED ORDER — DOVATO 50-300 MG PO TABS: 1.0000 | ORAL_TABLET | Freq: Every day | ORAL | 0 refills | Status: DC

## 2021-03-29 NOTE — Assessment & Plan Note (Signed)
Restart his dovato Renew ADAP Given condoms rtc in 4 weeks.

## 2021-03-29 NOTE — Assessment & Plan Note (Signed)
Resolved

## 2021-03-29 NOTE — Progress Notes (Signed)
Medication Samples have been provided to the patient.  Drug name: Dovato        Strength: 50/300 mg         Qty: 28 tablets (2 bottles)   LOT: [id number]   Exp.Date: 08/2022  Dosing instructions: Take one tablet by mouth once daily  The patient has been instructed regarding the correct time, dose, and frequency of taking this medication, including desired effects and most common side effects.   Copper Basnett L. Jannette Fogo, PharmD, BCIDP, AAHIVP, CPP Clinical Pharmacist Practitioner Infectious Diseases Clinical Pharmacist [facility] 09/11/2020, 10:07 AM

## 2021-03-29 NOTE — Assessment & Plan Note (Addendum)
Will send to MRI and to surgery Resume bactrim ED eval- "I know this is bad... this is the 4th time this has happened... I don't want to go to ED".  rtc in 1 week

## 2021-03-29 NOTE — Progress Notes (Signed)
   Subjective:    Patient ID: Ethan Goodwin, male  DOB: 1991/01/16, 30 y.o.        MRN: [id number]   HPI 30 yo M prev seen by Dr Daiva Eves for HIV+ 12-2-20210. He was started on dovato but did not make f/u appt.  He was adm 6-12 to 6-14 with enterobacter bacteremia and L hand infection after injecting.  He did not require surgery.  He was d/c on bactrim and dovato.  He has been off dovato for last week. Feels like he is "ill" if he is off meds for a few days. Then "weird for a day or two" when he goes back on.  Went to refill his rx at his pharmacy and his rx/ADAP was expired.   HIV 1 RNA Quant  Date Value  07/26/2020 <20 Copies/mL (H)  04/10/2020 20 copies/mL  11/22/2019 16,300 copies/mL (H)   CD4 T Cell Abs (/uL)  Date Value  07/26/2020 978  04/10/2020 801  11/22/2019 574     Health Maintenance  Topic Date Due   COVID-19 Vaccine (1) Never done   Pneumococcal Vaccine 32-69 Years old (1 - PCV) Never done   TETANUS/TDAP  Never done   INFLUENZA VACCINE  04/30/2021   Hepatitis C Screening  Completed   HIV Screening  Completed   HPV VACCINES  Aged Out      Review of Systems  Constitutional:  Positive for weight loss (variable. just wants to eat sweats). Negative for chills and fever.  HENT:  Positive for congestion. Negative for sore throat.   Respiratory:  Negative for cough and shortness of breath.   Gastrointestinal:  Negative for constipation and diarrhea.  Genitourinary:  Negative for dysuria and urgency.  Psychiatric/Behavioral:  The patient has insomnia.    Please see HPI. All other systems reviewed and negative.     Objective:  Physical Exam Vitals reviewed.  Constitutional:      General: He is not in acute distress.    Appearance: Normal appearance. He is not ill-appearing, toxic-appearing or diaphoretic.  HENT:     Mouth/Throat:     Mouth: Mucous membranes are moist.     Pharynx: No oropharyngeal exudate.  Eyes:     Extraocular Movements: Extraocular  movements intact.     Pupils: Pupils are equal, round, and reactive to light.  Cardiovascular:     Rate and Rhythm: Normal rate and regular rhythm.  Pulmonary:     Effort: Pulmonary effort is normal.     Breath sounds: Normal breath sounds.  Abdominal:     General: Bowel sounds are normal. There is no distension.     Palpations: Abdomen is soft.     Tenderness: There is no abdominal tenderness.  Musculoskeletal:     Cervical back: Normal range of motion and neck supple.     Right lower leg: No edema.     Left lower leg: No edema.  Skin:    Findings: Abscess present.     Comments: ~ 1.5" swelling with erythema in his R UE, mid forearm. There is no fluctuance. Minimal tenderness. He did have recent injection here. There is no os.   Neurological:     General: No focal deficit present.     Mental Status: He is alert.        Assessment & Plan:

## 2021-03-29 NOTE — Addendum Note (Signed)
Addended by: Taisa Deloria C on: 03/29/2021 02:53 PM   Modules accepted: Orders

## 2021-03-29 NOTE — Assessment & Plan Note (Addendum)
Will get him in with counselor, which he requests.  Repeat Hepatitis serologies at f/u visit.

## 2021-03-30 ENCOUNTER — Other Ambulatory Visit: Payer: Self-pay

## 2021-04-03 ENCOUNTER — Encounter: Payer: Self-pay | Admitting: Infectious Diseases

## 2021-04-04 ENCOUNTER — Ambulatory Visit: Payer: Self-pay | Admitting: Orthopaedic Surgery

## 2021-05-02 IMAGING — MR MR [PERSON_NAME]*[PERSON_NAME]* WO/W CM
10 series · 40 of 40 positions shown · IV contrast (gadavist)
Comparison: None.

CLINICAL DATA: Right hand swelling, IV drug abuse, question of
infection

EXAM:
MRI OF THE RIGHT HAND WITHOUT AND WITH CONTRAST; MRI OF THE RIGHT
WRIST WITHOUT AND WITH CONTRAST
TECHNIQUE: Multiplanar, multisequence MR imaging of the right was performed
before and after the administration of intravenous contrast.
CONTRAST:  6mL GADAVIST GADOBUTROL 1 MMOL/ML IV SOLN

[Series 3: T1 · coronal · right · 3.0mm · 0.51mm/px · 4 of 18 slices shown (1 of 2)]
[im 1/18]
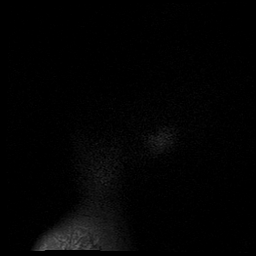
[im 6/18]
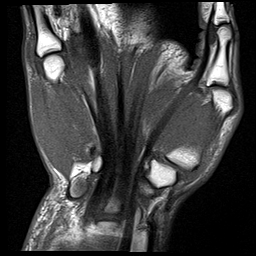
[im 12/18]
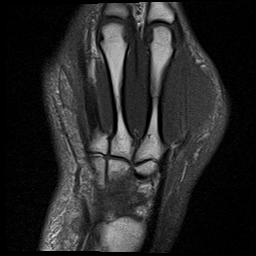
[im 18/18]
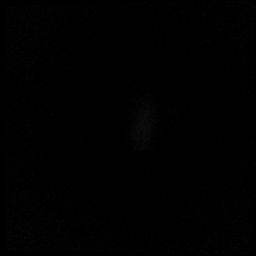

[Series 4: T2 fat-sat · coronal · right · 3.0mm · 0.25mm/px · 4 of 18 slices shown (1 of 2)]
[im 1/18]
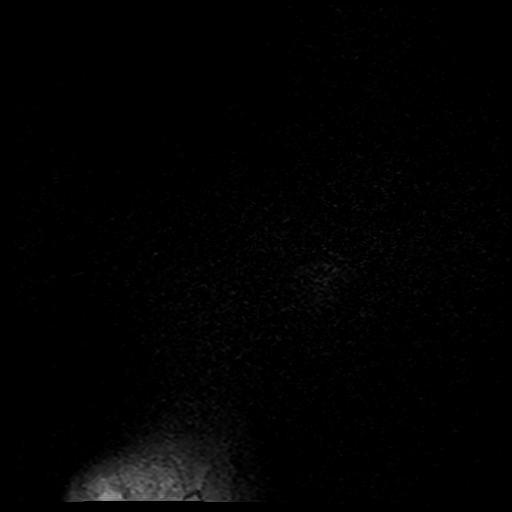
[im 6/18]
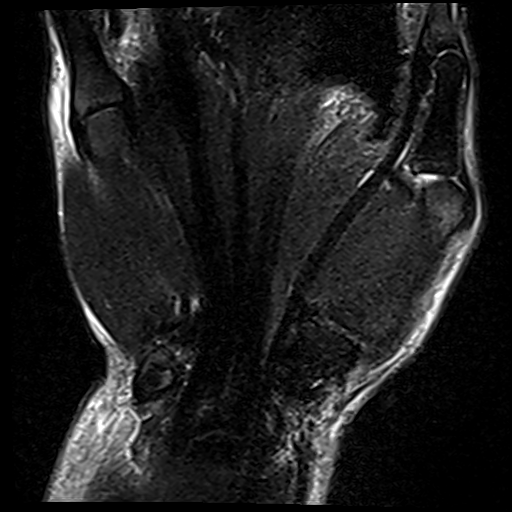
[im 12/18]
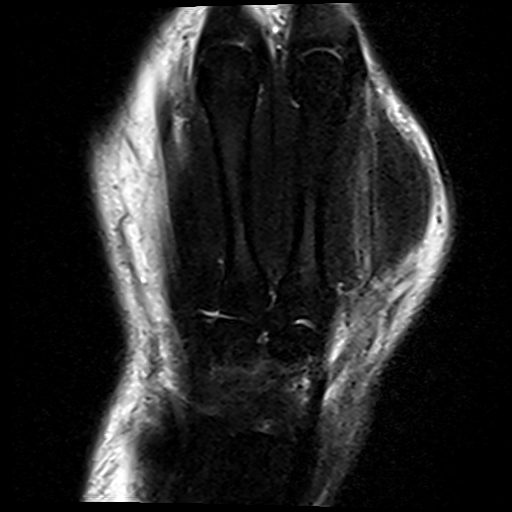
[im 18/18]
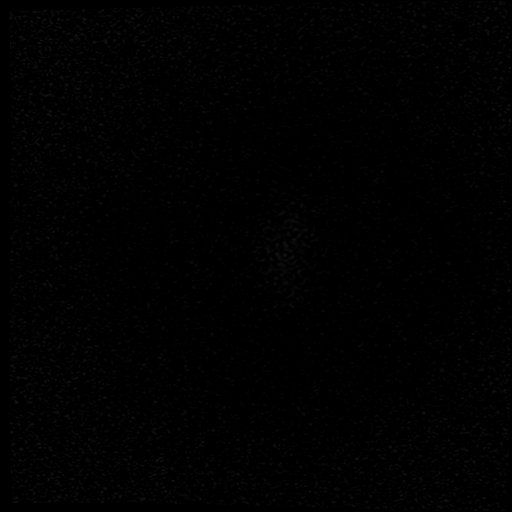

[Series 5: STIR · coronal · right · 3.0mm · 0.51mm/px · 3 of 17 slices shown]
[im 1/17]
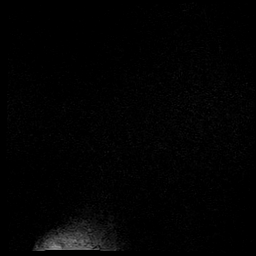
[im 9/17]
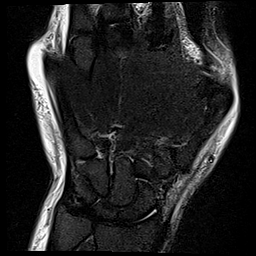
[im 17/17]
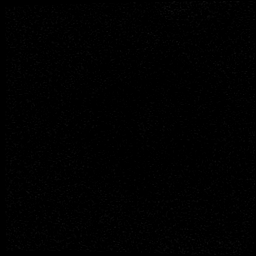

[Series 6: PD fat-sat · sagittal · right · 3.0mm · 0.20mm/px · 5 of 33 slices shown]
[im 1/33]
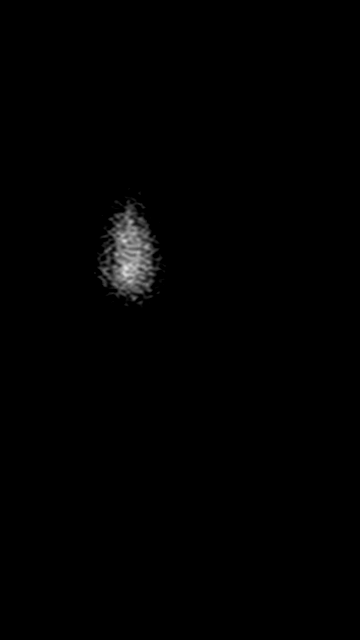
[im 9/33]
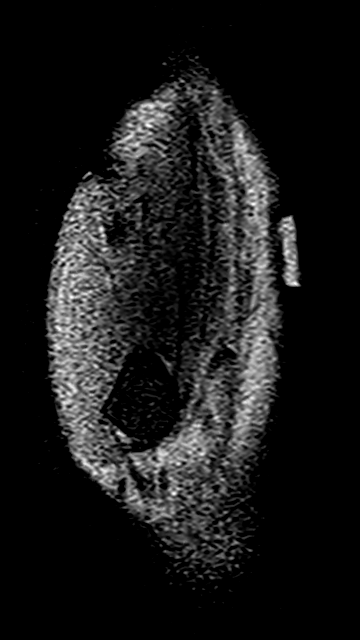
[im 17/33]
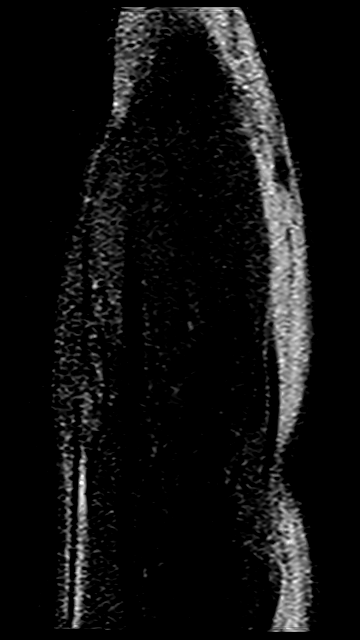
[im 25/33]
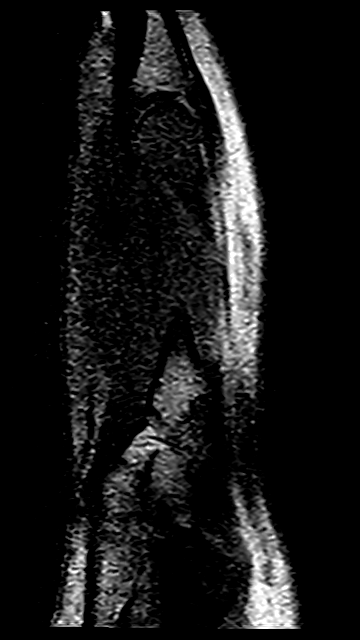
[im 33/33]
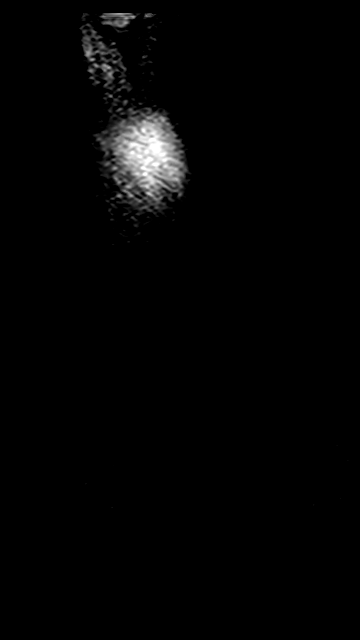

[Series 11: T1 fat-sat post-contrast · coronal · right · 3.0mm · 0.51mm/px · 3 of 18 slices shown (1 of 3)]
[im 1/18]
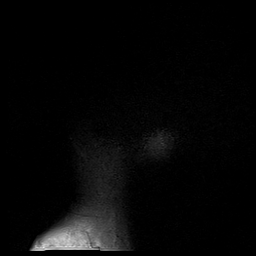
[im 9/18]
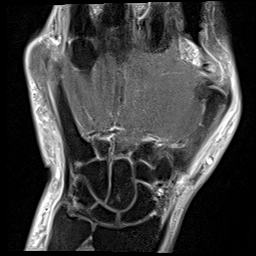
[im 18/18]
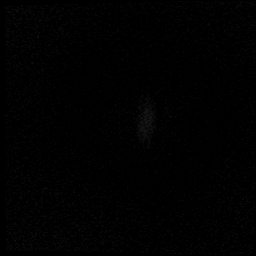

[Series 12: T1 fat-sat post-contrast · sagittal · right · 3.0mm · 0.51mm/px · 5 of 33 slices shown (2 of 3)]
[im 1/33]
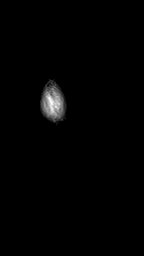
[im 9/33]
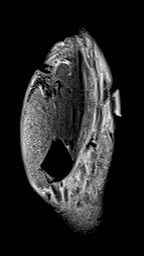
[im 17/33]
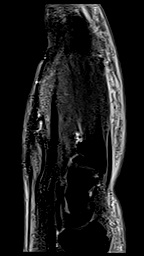
[im 25/33]
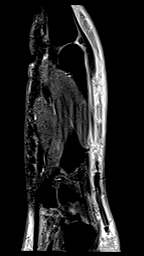
[im 33/33]
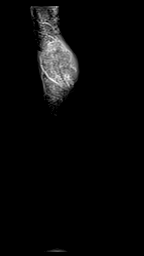

[Series 100: T1 · axial · right · 4.0mm · 0.51mm/px · z∈[-65,+53]mm · 4 of 28 slices shown (2 of 2)]
[im 1/28]
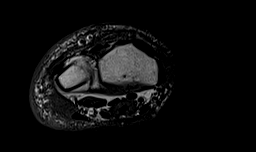
[im 10/28]
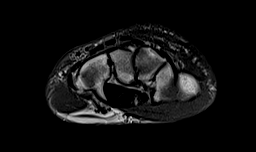
[im 19/28]
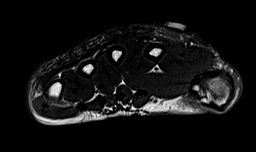
[im 28/28]
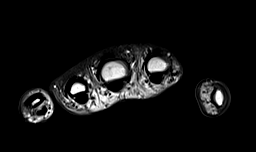

[Series 101: T2 fat-sat · axial · right · 4.0mm · 0.51mm/px · z∈[-65,+53]mm · 4 of 28 slices shown (2 of 2)]
[im 1/28]
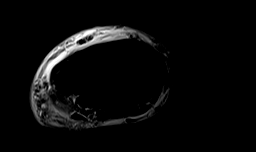
[im 10/28]
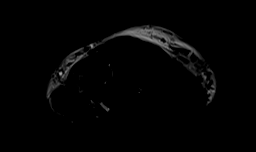
[im 19/28]
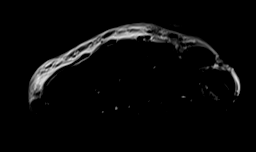
[im 28/28]
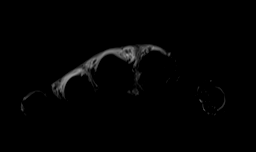

[Series 102: T1 fat-sat · axial · non-contrast · right · 4.0mm · 0.51mm/px · z∈[-65,+53]mm · 4 of 28 slices shown]
[im 1/28]
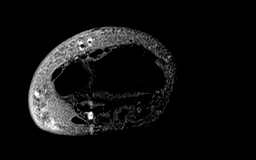
[im 10/28]
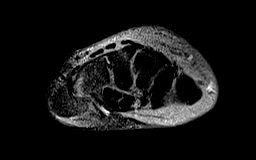
[im 19/28]
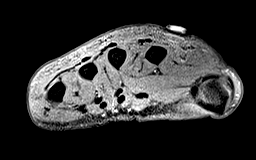
[im 28/28]
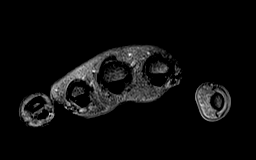

[Series 103: T1 fat-sat post-contrast · axial · right · 4.0mm · 0.51mm/px · z∈[-65,+53]mm · 4 of 28 slices shown (3 of 3)]
[im 1/28]
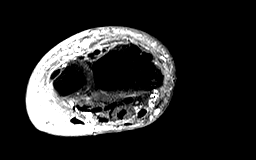
[im 10/28]
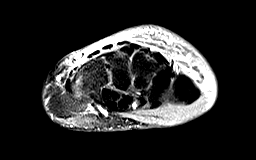
[im 19/28]
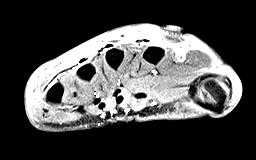
[im 28/28]
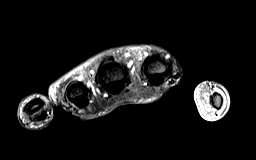

[40 of 40 positions shown; findings below may reference images not displayed]

FINDINGS: Bones/Joint/Cartilage

Normal osseous marrow signal is seen throughout. No osseous
fracture, marrow edema, or cortical destruction. No large joint
effusions are seen. The articular surfaces are well maintained.

Ligaments

The visualized portions of the ligaments are intact.

Muscles and Tendons

The muscles surrounding the hand and wrist are normal in appearance
without focal atrophy edema, or tear. There is a small amount of
fluid seen surrounding extensor compartment 1 and 2 at the level of
the wrist. The remainder of the extensor and flexor tendons are
intact.

Soft tissues

Diffuse dorsal subcutaneous edema and non loculated fluid seen
surrounding the wrist and hand. No loculated fluid collections are
noted. No subcutaneous emphysema or areas of signal dropout.
Overlying skin thickening noted.
IMPRESSION: Findings consistent with diffuse cellulitis and non loculated
fluid/phlegmon at the level of the wrist. No definite evidence
osteomyelitis or abscess formation.

Mild extensor compartment 1 and 2 tenosynovitis

## 2021-05-24 ENCOUNTER — Other Ambulatory Visit (HOSPITAL_COMMUNITY): Payer: Self-pay

## 2021-06-07 ENCOUNTER — Ambulatory Visit: Payer: Self-pay | Admitting: Infectious Diseases

## 2021-07-03 ENCOUNTER — Ambulatory Visit (INDEPENDENT_AMBULATORY_CARE_PROVIDER_SITE_OTHER): Payer: Self-pay | Admitting: Infectious Diseases

## 2021-07-03 ENCOUNTER — Other Ambulatory Visit: Payer: Self-pay

## 2021-07-03 ENCOUNTER — Ambulatory Visit (INDEPENDENT_AMBULATORY_CARE_PROVIDER_SITE_OTHER): Payer: Self-pay

## 2021-07-03 ENCOUNTER — Encounter: Payer: Self-pay | Admitting: Infectious Diseases

## 2021-07-03 VITALS — BP 111/80 | Temp 97.5°F | Wt 140.0 lb

## 2021-07-03 DIAGNOSIS — Z113 Encounter for screening for infections with a predominantly sexual mode of transmission: Secondary | ICD-10-CM

## 2021-07-03 DIAGNOSIS — Z23 Encounter for immunization: Secondary | ICD-10-CM

## 2021-07-03 DIAGNOSIS — F1990 Other psychoactive substance use, unspecified, uncomplicated: Secondary | ICD-10-CM

## 2021-07-03 DIAGNOSIS — Z79899 Other long term (current) drug therapy: Secondary | ICD-10-CM

## 2021-07-03 DIAGNOSIS — B20 Human immunodeficiency virus [HIV] disease: Secondary | ICD-10-CM

## 2021-07-03 MED ORDER — DOVATO 50-300 MG PO TABS: 1.0000 | ORAL_TABLET | Freq: Every day | ORAL | 0 refills | Status: DC

## 2021-07-03 NOTE — Addendum Note (Signed)
Addended by: Harley Alto on: 07/03/2021 04:53 PM   Modules accepted: Orders

## 2021-07-03 NOTE — Assessment & Plan Note (Signed)
He is off meds I refilled His adap is active Given condoms COVID vax today Refer to [facility] vax site Check for STIs as well as Hep C rtc in 2-3 months.

## 2021-07-03 NOTE — Progress Notes (Signed)
   Covid-19 Vaccination Clinic  Name:  Dinero Chavira    MRN: [id number] DOB: 26-Jun-1991  07/03/2021  Mr. Amparan was observed post Covid-19 immunization for 15 minutes without incident. He was provided with Vaccine Information Sheet and instruction to access the V-Safe system.   Mr. Daubert was instructed to call 911 with any severe reactions post vaccine: Difficulty breathing  Swelling of face and throat  A fast heartbeat  A bad rash all over body  Dizziness and weakness     Alexavier Tsutsui P Brooklynne Pereida, CMA

## 2021-07-03 NOTE — Assessment & Plan Note (Signed)
He has multiple skin lesions.  He is not skin popping by his hx.

## 2021-07-03 NOTE — Progress Notes (Signed)
   Subjective:    Patient ID: Ethan Goodwin, male  DOB: [birth year], 30 y.o.        MRN: [id number]   HPI 30 yo M prev seen by Dr Daiva Eves for HIV+ 12-2-20210. He was started on dovato but did not make f/u appt.  He was adm 6-12 to 6-14 with enterobacter bacteremia and L hand infection after injecting.  He did not require surgery.  He was d/c on bactrim and dovato.  He has been off dovato for last month- rx expired, ADAP expired.   Has been having cloudy urine, strong odor, has to stop and start.  Has had new partners.  No f/c Has not gotten mpox, would like.  No rashes  HIV 1 RNA Quant  Date Value  07/26/2020 <20 Copies/mL (H)  04/10/2020 20 copies/mL  11/22/2019 16,300 copies/mL (H)   CD4 T Cell Abs (/uL)  Date Value  07/26/2020 978  04/10/2020 801  11/22/2019 574     Health Maintenance  Topic Date Due  . TETANUS/TDAP  Never done  . COVID-19 Vaccine (3 - Pfizer risk series) 02/23/2021  . INFLUENZA VACCINE  04/30/2021  . Hepatitis C Screening  Completed  . HIV Screening  Completed  . HPV VACCINES  Aged Out      Review of Systems  Constitutional:  Negative for chills, fever and weight loss.  Respiratory:  Negative for cough and shortness of breath.   Gastrointestinal:  Negative for constipation and diarrhea.  Genitourinary:  Positive for frequency. Negative for dysuria and urgency.  Psychiatric/Behavioral:  The patient has insomnia.    Please see HPI. All other systems reviewed and negative.     Objective:  Physical Exam Vitals reviewed.          Assessment & Plan:

## 2021-07-04 ENCOUNTER — Other Ambulatory Visit: Payer: Self-pay

## 2021-07-04 ENCOUNTER — Other Ambulatory Visit (HOSPITAL_COMMUNITY): Payer: Self-pay

## 2021-07-05 LAB — URINE CYTOLOGY ANCILLARY ONLY
Chlamydia: NEGATIVE
Comment: NEGATIVE
Comment: NORMAL
Neisseria Gonorrhea: NEGATIVE

## 2021-07-10 ENCOUNTER — Other Ambulatory Visit: Payer: Self-pay | Admitting: Pharmacist

## 2021-07-10 DIAGNOSIS — B20 Human immunodeficiency virus [HIV] disease: Secondary | ICD-10-CM

## 2021-07-10 MED ORDER — DOVATO 50-300 MG PO TABS: 1.0000 | ORAL_TABLET | Freq: Every day | ORAL | 0 refills | Status: AC

## 2021-07-10 NOTE — Progress Notes (Signed)
Medication Samples have been provided to the patient.  Drug name: Dovato        Strength: 50/300 mg         Qty: 14  Tablets (1 bottles) LOT: 2A6H   Exp.Date: 2/24  Dosing instructions: Take one tablet by mouth once daily  The patient has been instructed regarding the correct time, dose, and frequency of taking this medication, including desired effects and most common side effects.   Margarite Gouge, PharmD, CPP Clinical Pharmacist Practitioner Infectious Diseases Clinical Pharmacist [facility] Infect[facility]

## 2021-07-20 ENCOUNTER — Other Ambulatory Visit: Payer: Self-pay | Admitting: Infectious Diseases

## 2021-07-20 ENCOUNTER — Other Ambulatory Visit: Payer: Self-pay

## 2021-08-03 ENCOUNTER — Other Ambulatory Visit: Payer: Self-pay

## 2021-08-03 DIAGNOSIS — F1990 Other psychoactive substance use, unspecified, uncomplicated: Secondary | ICD-10-CM

## 2021-08-03 DIAGNOSIS — B20 Human immunodeficiency virus [HIV] disease: Secondary | ICD-10-CM

## 2021-08-03 DIAGNOSIS — Z113 Encounter for screening for infections with a predominantly sexual mode of transmission: Secondary | ICD-10-CM

## 2021-08-03 DIAGNOSIS — Z79899 Other long term (current) drug therapy: Secondary | ICD-10-CM

## 2021-08-07 ENCOUNTER — Other Ambulatory Visit: Payer: Self-pay | Admitting: Pharmacist

## 2021-08-07 DIAGNOSIS — Z79899 Other long term (current) drug therapy: Secondary | ICD-10-CM

## 2021-08-07 DIAGNOSIS — B20 Human immunodeficiency virus [HIV] disease: Secondary | ICD-10-CM

## 2021-08-07 LAB — COMPREHENSIVE METABOLIC PANEL
AG Ratio: 1.1 (calc) (ref 1.0–2.5)
ALT: 20 U/L (ref 9–46)
AST: 24 U/L (ref 10–40)
Albumin: 4.6 g/dL (ref 3.6–5.1)
Alkaline phosphatase (APISO): 86 U/L (ref 36–130)
BUN: 13 mg/dL (ref 7–25)
CO2: 27 mmol/L (ref 20–32)
Calcium: 10 mg/dL (ref 8.6–10.3)
Chloride: 100 mmol/L (ref 98–110)
Creat: 1.03 mg/dL (ref 0.60–1.26)
Globulin: 4.3 g/dL (calc) — ABNORMAL HIGH (ref 1.9–3.7)
Glucose, Bld: 81 mg/dL (ref 65–99)
Potassium: 4.2 mmol/L (ref 3.5–5.3)
Sodium: 135 mmol/L (ref 135–146)
Total Bilirubin: 1 mg/dL (ref 0.2–1.2)
Total Protein: 8.9 g/dL — ABNORMAL HIGH (ref 6.1–8.1)

## 2021-08-07 LAB — CBC
HCT: 44.7 % (ref 38.5–50.0)
Hemoglobin: 15 g/dL (ref 13.2–17.1)
MCH: 31.8 pg (ref 27.0–33.0)
MCHC: 33.6 g/dL (ref 32.0–36.0)
MCV: 94.7 fL (ref 80.0–100.0)
MPV: 10.1 fL (ref 7.5–12.5)
Platelets: 215 10*3/uL (ref 140–400)
RBC: 4.72 10*6/uL (ref 4.20–5.80)
RDW: 12.9 % (ref 11.0–15.0)
WBC: 5.1 10*3/uL (ref 3.8–10.8)

## 2021-08-07 LAB — HIV-1 RNA QUANT-NO REFLEX-BLD
HIV 1 RNA Quant: 46800 Copies/mL — ABNORMAL HIGH
HIV-1 RNA Quant, Log: 4.67 Log cps/mL — ABNORMAL HIGH

## 2021-08-07 LAB — HEPATITIS B SURFACE ANTIGEN: Hepatitis B Surface Ag: NONREACTIVE

## 2021-08-07 LAB — FLUORESCENT TREPONEMAL AB(FTA)-IGG-BLD: Fluorescent Treponemal ABS: REACTIVE — AB

## 2021-08-07 LAB — HEPATITIS B CORE ANTIBODY, TOTAL: Hep B Core Total Ab: NONREACTIVE

## 2021-08-07 LAB — HEPATITIS C ANTIBODY
Hepatitis C Ab: NONREACTIVE
SIGNAL TO CUT-OFF: 0.11 (ref <1.00)

## 2021-08-07 LAB — LIPID PANEL
Cholesterol: 138 mg/dL (ref <200)
HDL: 56 mg/dL (ref >=40)
LDL Cholesterol (Calc): 68 mg/dL (calc)
Non-HDL Cholesterol (Calc): 82 mg/dL (calc) (ref <130)
Total CHOL/HDL Ratio: 2.5 (calc) (ref <5.0)
Triglycerides: 49 mg/dL (ref <150)

## 2021-08-07 LAB — HEPATITIS B SURFACE ANTIBODY,QUALITATIVE: Hep B S Ab: REACTIVE — AB

## 2021-08-07 LAB — RPR: RPR Ser Ql: REACTIVE — AB

## 2021-08-07 LAB — RPR TITER: RPR Titer: 1:16 {titer} — ABNORMAL HIGH

## 2021-08-07 LAB — HEPATITIS A ANTIBODY, TOTAL: Hepatitis A AB,Total: NONREACTIVE

## 2021-08-07 MED ORDER — DOVATO 50-300 MG PO TABS: 1.0000 | ORAL_TABLET | Freq: Every day | ORAL | 0 refills | Status: AC

## 2021-08-07 NOTE — Progress Notes (Signed)
Medication Samples have been provided to the patient.  Drug name: Dovato        Strength: 50/300 mg         Qty: 14  Tablets (1 bottles) LOT: 2A6H   Exp.Date: 2/24  Dosing instructions: Take one tablet by mouth once daily  The patient has been instructed regarding the correct time, dose, and frequency of taking this medication, including desired effects and most common side effects.   Sharrie Self, PharmD, CPP Clinical Pharmacist Practitioner Infectious Diseases Clinical Pharmacist [facility]  

## 2021-08-15 ENCOUNTER — Telehealth: Payer: Self-pay

## 2021-08-15 NOTE — Telephone Encounter (Signed)
Received call from [facility] with increase RPR titer results. Current titer 08/03/21: 1:16; previous 04/10/2020: 1:2  Routing to provider for advise.  [name]

## 2021-08-16 ENCOUNTER — Telehealth: Payer: Self-pay

## 2021-08-16 NOTE — Telephone Encounter (Signed)
RN called three numbers on file and all were unable to leave voicemails. Sent mychart message requesting a call to schedule bicillin injections. RN clarified treatment plan with Dr. Daiva Eves who ordered bicillin 2.4 million units x3 weeks. Will reach out to [facility] to let them know plan and inability to reach patient.   Anjoli Diemer L[facility] Mast, RN

## 2021-08-17 NOTE — Telephone Encounter (Signed)
Called all three numbers listed in chart, someone else answered and stated that Shana was not available. Asked that they please have Ebb call his doctor's office back. Did not disclose any private or health information. Faxing patient information to [facility] for followup.   Sandie Ano, RN

## 2021-09-13 ENCOUNTER — Ambulatory Visit: Payer: Self-pay | Admitting: Infectious Diseases

## 2021-09-15 ENCOUNTER — Other Ambulatory Visit: Payer: Self-pay | Admitting: Infectious Diseases

## 2021-09-15 DIAGNOSIS — B20 Human immunodeficiency virus [HIV] disease: Secondary | ICD-10-CM

## 2021-10-05 ENCOUNTER — Ambulatory Visit: Payer: Self-pay | Admitting: Infectious Diseases

## 2021-10-05 ENCOUNTER — Telehealth: Payer: Self-pay

## 2021-10-05 ENCOUNTER — Inpatient Hospital Stay (HOSPITAL_COMMUNITY): Admission: RE | Admit: 2021-10-05 | Payer: Self-pay | Source: Ambulatory Visit

## 2021-10-05 ENCOUNTER — Other Ambulatory Visit: Payer: Self-pay

## 2021-10-05 ENCOUNTER — Ambulatory Visit (INDEPENDENT_AMBULATORY_CARE_PROVIDER_SITE_OTHER): Payer: Self-pay | Admitting: Internal Medicine

## 2021-10-05 ENCOUNTER — Ambulatory Visit: Payer: Self-pay

## 2021-10-05 ENCOUNTER — Encounter: Payer: Self-pay | Admitting: Internal Medicine

## 2021-10-05 VITALS — BP 120/82 | HR 115 | Temp 97.6°F | Wt 142.0 lb

## 2021-10-05 DIAGNOSIS — Z23 Encounter for immunization: Secondary | ICD-10-CM

## 2021-10-05 DIAGNOSIS — B20 Human immunodeficiency virus [HIV] disease: Secondary | ICD-10-CM

## 2021-10-05 DIAGNOSIS — A539 Syphilis, unspecified: Secondary | ICD-10-CM

## 2021-10-05 DIAGNOSIS — Z202 Contact with and (suspected) exposure to infections with a predominantly sexual mode of transmission: Secondary | ICD-10-CM

## 2021-10-05 MED ORDER — PENICILLIN G BENZATHINE 1200000 UNIT/2ML IM SUSY
1.2000 10*6.[IU] | PREFILLED_SYRINGE | Freq: Once | INTRAMUSCULAR | Status: AC
  Administered 2021-10-05: 1.2 10*6.[IU] via INTRAMUSCULAR

## 2021-10-05 MED ORDER — DOXYCYCLINE HYCLATE 100 MG PO TABS: 100.0000 mg | ORAL_TABLET | Freq: Two times a day (BID) | ORAL | 0 refills | Status: AC

## 2021-10-05 MED ORDER — CEFTRIAXONE SODIUM 500 MG IJ SOLR
500.0000 mg | Freq: Once | INTRAMUSCULAR | Status: AC
  Administered 2021-10-05: 500 mg via INTRAMUSCULAR

## 2021-10-05 NOTE — Addendum Note (Signed)
Addended by: Leatrice Jewels on: 10/05/2021 03:33 PM   Modules accepted: Orders

## 2021-10-05 NOTE — Addendum Note (Signed)
Addended by: Harley Alto on: 10/05/2021 02:59 PM   Modules accepted: Orders

## 2021-10-05 NOTE — Telephone Encounter (Signed)
Attempted to call patient regarding missed appointment he had with office today. Left voicemail requesting call back to reschedule. Patient has multiple no shows and needs to be treated for Syphillis. Will send mychart message today requesting follow up.  Juanita Laster, RMA

## 2021-10-05 NOTE — Progress Notes (Signed)
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   SULFAMETHOXAZOLE-TRIMETHOPRIM (BACTRIM DS) 800-160 MG TABLET    Take 1 tablet by mouth 2 (two) times daily.  Modified Medications   No medications on file  Discontinued Medications   No medications on file    Subjective: 30 YM with HIV (VL 46,800 on 08/03/21, Cd4 978 on 07/26/20) on Dovato. Presents for HIV care. RPR was 1:16 on 08/03/21, has not recieved first shot.  He is unclear when he was exposed. Reports having foul smelling penile discharge for the past month. He participates in sex work, mostly oral.  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 He has missed multiple doses of Dovato this month. He states this is due to pharmacy issue, which is now fixed.   Review of Systems: Review of Systems  All other systems reviewed and are negative.  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  No Known Allergies  Health Maintenance  Topic Date Due   TETANUS/TDAP  Never done   Pneumococcal Vaccine 64-90 Years old (2 - PPSV23 if available, else PCV20) 10/19/2020   INFLUENZA VACCINE  04/30/2021   COVID-19 Vaccine (4  - Booster for Pfizer series) 08/28/2021   Hepatitis C Screening  Completed   HIV Screening  Completed   HPV VACCINES  Aged Out    Objective:  Vitals:   10/05/21 1350  Weight: 142 lb (64.4 kg)   Body mass index is 22.24 kg/m.  Physical Exam Constitutional:      General: He is not in acute distress.    Appearance: He is normal weight. He is not toxic-appearing.  HENT:     Head: Normocephalic and atraumatic.     Right Ear: External ear normal.     Left Ear: External ear normal.     Nose: No congestion or rhinorrhea.     Mouth/Throat:     Mouth: Mucous membranes are moist.     Pharynx: Oropharynx is clear.  Eyes:     Extraocular Movements: Extraocular movements intact.     Conjunctiva/sclera: Conjunctivae normal.     Pupils: Pupils are equal, round, and reactive to light.  Cardiovascular:     Rate and Rhythm: Normal rate and regular rhythm.     Heart sounds: No murmur heard.   No friction rub. No gallop.  Pulmonary:     Effort: Pulmonary effort is normal.     Breath sounds: Normal breath sounds.  Abdominal:     General:  Abdomen is flat. Bowel sounds are normal.     Palpations: Abdomen is soft.  Musculoskeletal:        General: No swelling. Normal range of motion.     Cervical back: Normal range of motion and neck supple.  Skin:    General: Skin is warm and dry.  Neurological:     General: No focal deficit present.     Mental Status: He is oriented to person, place, and time.  Psychiatric:        Mood and Affect: Mood normal.    Lab Results Lab Results  Component Value Date   WBC 5.1 08/03/2021   HGB 15.0 08/03/2021   HCT 44.7 08/03/2021   MCV 94.7 08/03/2021   PLT 215 08/03/2021    Lab Results  Component Value Date   CREATININE 1.03 08/03/2021   BUN 13 08/03/2021   NA 135 08/03/2021   K 4.2 08/03/2021   CL 100 08/03/2021   CO2 27 08/03/2021    Lab Results  Component Value Date   ALT 20 08/03/2021   AST 24 08/03/2021   ALKPHOS 69 04/12/2020    BILITOT 1.0 08/03/2021    Lab Results  Component Value Date   CHOL 138 08/03/2021   HDL 56 08/03/2021   LDLCALC 68 08/03/2021   TRIG 49 08/03/2021   CHOLHDL 2.5 08/03/2021   Lab Results  Component Value Date   LABRPR REACTIVE (A) 08/03/2021   RPRTITER 1:16 (H) 08/03/2021   HIV 1 RNA Quant  Date Value  08/03/2021 46,800 Copies/mL (H)  07/26/2020 <20 Copies/mL (H)  04/10/2020 20 copies/mL   CD4 T Cell Abs (/uL)  Date Value  07/26/2020 978  04/10/2020 801  11/22/2019 574     HIV -Continue Dovato. Counseled on adherence -Reports foul smelling penile discharge. Engages in sex work(oral). Will administer ceftriaxone and Rx doxy empirically.  -HIV labs today, GC three point, RPR -Follow-up in 3 months  Syphilis-Late latent -Bicillin 2.4MU weekly x3 weeks -1st dose today  #IVDA with heroin -Currently no interested in cessation -Plans to speak with [facility]  #Vaccination COVID-needs booster(pt will make the appointment) Flu-order today Monkeypox-information provided PCV 13 10/20/19-needs 23(order today) Meningitis-next visit HepA-non-immune(next visit) HEpB-immune   #Health maintenance -Quantiferon-order today -HCV-Ab NR on 08/03/21     Danelle Earthly, MD [facility] 10/05/2021, 1:53 PM

## 2021-10-05 NOTE — Addendum Note (Signed)
Addended by: Caffie Pinto on: 10/05/2021 04:03 PM   Modules accepted: Orders

## 2021-10-06 LAB — URINALYSIS, ROUTINE W REFLEX MICROSCOPIC
Bilirubin Urine: NEGATIVE
Glucose, UA: NEGATIVE
Hgb urine dipstick: NEGATIVE
Ketones, ur: NEGATIVE
Leukocytes,Ua: NEGATIVE
Nitrite: NEGATIVE
Protein, ur: NEGATIVE
Specific Gravity, Urine: 1.017 (ref 1.001–1.035)
pH: 5.5 (ref 5.0–8.0)

## 2021-10-08 LAB — CYTOLOGY, (ORAL, ANAL, URETHRAL) ANCILLARY ONLY
Chlamydia: NEGATIVE
Chlamydia: NEGATIVE
Comment: NEGATIVE
Comment: NEGATIVE
Comment: NORMAL
Comment: NORMAL
Neisseria Gonorrhea: NEGATIVE
Neisseria Gonorrhea: POSITIVE — AB

## 2021-10-08 LAB — URINE CYTOLOGY ANCILLARY ONLY
Chlamydia: NEGATIVE
Comment: NEGATIVE
Comment: NORMAL
Neisseria Gonorrhea: NEGATIVE

## 2021-10-12 ENCOUNTER — Other Ambulatory Visit: Payer: Self-pay

## 2021-10-12 ENCOUNTER — Ambulatory Visit (INDEPENDENT_AMBULATORY_CARE_PROVIDER_SITE_OTHER): Payer: Self-pay

## 2021-10-12 DIAGNOSIS — A539 Syphilis, unspecified: Secondary | ICD-10-CM

## 2021-10-12 MED ORDER — PENICILLIN G BENZATHINE 1200000 UNIT/2ML IM SUSY
1.2000 10*6.[IU] | PREFILLED_SYRINGE | Freq: Once | INTRAMUSCULAR | Status: AC
  Administered 2021-10-12: 1.2 10*6.[IU] via INTRAMUSCULAR

## 2021-10-15 ENCOUNTER — Other Ambulatory Visit: Payer: Self-pay

## 2021-10-19 ENCOUNTER — Other Ambulatory Visit: Payer: Self-pay

## 2021-10-19 ENCOUNTER — Ambulatory Visit (INDEPENDENT_AMBULATORY_CARE_PROVIDER_SITE_OTHER): Payer: Self-pay

## 2021-10-19 DIAGNOSIS — A539 Syphilis, unspecified: Secondary | ICD-10-CM

## 2021-10-19 DIAGNOSIS — B20 Human immunodeficiency virus [HIV] disease: Secondary | ICD-10-CM

## 2021-10-19 MED ORDER — PENICILLIN G BENZATHINE 1200000 UNIT/2ML IM SUSY
1.2000 10*6.[IU] | PREFILLED_SYRINGE | Freq: Once | INTRAMUSCULAR | Status: AC
  Administered 2021-10-19: 1.2 10*6.[IU] via INTRAMUSCULAR

## 2021-10-24 LAB — RPR TITER: RPR Titer: 1:32 {titer} — ABNORMAL HIGH

## 2021-10-24 LAB — COMPLETE METABOLIC PANEL WITH GFR
AG Ratio: 1.1 (calc) (ref 1.0–2.5)
ALT: 16 U/L (ref 9–46)
AST: 17 U/L (ref 10–40)
Albumin: 4.3 g/dL (ref 3.6–5.1)
Alkaline phosphatase (APISO): 83 U/L (ref 36–130)
BUN: 15 mg/dL (ref 7–25)
CO2: 31 mmol/L (ref 20–32)
Calcium: 9.9 mg/dL (ref 8.6–10.3)
Chloride: 104 mmol/L (ref 98–110)
Creat: 1 mg/dL (ref 0.60–1.26)
Globulin: 4 g/dL (calc) — ABNORMAL HIGH (ref 1.9–3.7)
Glucose, Bld: 47 mg/dL — ABNORMAL LOW (ref 65–99)
Potassium: 3.9 mmol/L (ref 3.5–5.3)
Sodium: 142 mmol/L (ref 135–146)
Total Bilirubin: 0.5 mg/dL (ref 0.2–1.2)
Total Protein: 8.3 g/dL — ABNORMAL HIGH (ref 6.1–8.1)
eGFR: 103 mL/min/{1.73_m2} (ref >=60)

## 2021-10-24 LAB — CBC WITH DIFFERENTIAL/PLATELET
Absolute Monocytes: 530 cells/uL (ref 200–950)
Basophils Absolute: 39 cells/uL (ref 0–200)
Basophils Relative: 0.5 %
Eosinophils Absolute: 62 cells/uL (ref 15–500)
Eosinophils Relative: 0.8 %
HCT: 47.3 % (ref 38.5–50.0)
Hemoglobin: 15.4 g/dL (ref 13.2–17.1)
Lymphs Abs: 2995 cells/uL (ref 850–3900)
MCH: 31.4 pg (ref 27.0–33.0)
MCHC: 32.6 g/dL (ref 32.0–36.0)
MCV: 96.5 fL (ref 80.0–100.0)
MPV: 10.3 fL (ref 7.5–12.5)
Monocytes Relative: 6.8 %
Neutro Abs: 4173 cells/uL (ref 1500–7800)
Neutrophils Relative %: 53.5 %
Platelets: 255 10*3/uL (ref 140–400)
RBC: 4.9 10*6/uL (ref 4.20–5.80)
RDW: 12 % (ref 11.0–15.0)
Total Lymphocyte: 38.4 %
WBC: 7.8 10*3/uL (ref 3.8–10.8)

## 2021-10-24 LAB — QUANTIFERON-TB GOLD PLUS
Mitogen-NIL: >10 IU/mL
NIL: 0.05 IU/mL
QuantiFERON-TB Gold Plus: NEGATIVE
TB1-NIL: <0 IU/mL
TB2-NIL: <0 IU/mL

## 2021-10-24 LAB — HIV-1 RNA QUANT-NO REFLEX-BLD
HIV 1 RNA Quant: 25 Copies/mL — ABNORMAL HIGH
HIV-1 RNA Quant, Log: 1.4 Log cps/mL — ABNORMAL HIGH

## 2021-10-24 LAB — T-HELPER CELLS (CD4) COUNT (NOT AT ARMC)
Absolute CD4: 1296 cells/uL (ref 490–1740)
CD4 T Helper %: 40 % (ref 30–61)
Total lymphocyte count: 3244 cells/uL (ref 850–3900)

## 2021-10-24 LAB — RPR: RPR Ser Ql: REACTIVE — AB

## 2021-10-24 LAB — FLUORESCENT TREPONEMAL AB(FTA)-IGG-BLD: Fluorescent Treponemal ABS: REACTIVE — AB

## 2021-10-26 ENCOUNTER — Other Ambulatory Visit: Payer: Self-pay | Admitting: Internal Medicine

## 2021-10-26 ENCOUNTER — Encounter: Payer: Self-pay | Admitting: Internal Medicine

## 2021-10-26 ENCOUNTER — Other Ambulatory Visit: Payer: Self-pay | Admitting: Infectious Diseases

## 2021-10-26 ENCOUNTER — Other Ambulatory Visit (HOSPITAL_COMMUNITY): Payer: Self-pay

## 2021-10-26 DIAGNOSIS — E878 Other disorders of electrolyte and fluid balance, not elsewhere classified: Secondary | ICD-10-CM

## 2021-10-26 DIAGNOSIS — B20 Human immunodeficiency virus [HIV] disease: Secondary | ICD-10-CM

## 2021-10-26 MED ORDER — DOVATO 50-300 MG PO TABS
1.0000 | ORAL_TABLET | Freq: Every day | ORAL | 5 refills | Status: DC
  Filled 2021-10-26: qty 30, 30d supply, fill #0

## 2021-10-27 ENCOUNTER — Other Ambulatory Visit (HOSPITAL_COMMUNITY): Payer: Self-pay

## 2021-10-29 ENCOUNTER — Other Ambulatory Visit (HOSPITAL_COMMUNITY): Payer: Self-pay

## 2021-10-30 ENCOUNTER — Other Ambulatory Visit: Payer: Self-pay | Admitting: Infectious Diseases

## 2021-10-30 DIAGNOSIS — B20 Human immunodeficiency virus [HIV] disease: Secondary | ICD-10-CM

## 2022-01-11 ENCOUNTER — Ambulatory Visit: Payer: Self-pay | Admitting: Internal Medicine

## 2022-02-20 ENCOUNTER — Encounter: Payer: Self-pay | Admitting: Infectious Diseases

## 2022-04-17 ENCOUNTER — Other Ambulatory Visit: Payer: Self-pay

## 2022-04-17 DIAGNOSIS — B20 Human immunodeficiency virus [HIV] disease: Secondary | ICD-10-CM

## 2022-04-17 MED ORDER — DOVATO 50-300 MG PO TABS: 1.0000 | ORAL_TABLET | Freq: Every day | ORAL | 1 refills | Status: DC

## 2022-07-13 ENCOUNTER — Other Ambulatory Visit: Payer: Self-pay | Admitting: Internal Medicine

## 2022-07-13 DIAGNOSIS — B20 Human immunodeficiency virus [HIV] disease: Secondary | ICD-10-CM

## 2022-09-24 ENCOUNTER — Other Ambulatory Visit (HOSPITAL_COMMUNITY): Payer: Self-pay

## 2022-10-09 ENCOUNTER — Ambulatory Visit: Payer: Self-pay | Admitting: Internal Medicine

## 2022-10-09 NOTE — Progress Notes (Deleted)
         [place] for Infectious Disease       HPI: Ethan Goodwin is a 32 y.o. 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  Anal sex receptive***, insertive***. 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 Current Outpatient Medications on File Prior to Visit  Medication Sig Dispense Refill   DOVATO 50-300 MG tablet TAKE 1 TABLET BY MOUTH DAILY 30 tablet 0   nicotine (NICODERM CQ - DOSED IN MG/24 HOURS) 14 mg/24hr patch Place 1 patch (14 mg total) onto the skin daily as needed (nicotine withdrawal). (Patient not taking: Reported on 03/29/2021) 28 patch 0   sulfamethoxazole-trimethoprim (BACTRIM DS) 800-160 MG tablet Take 1 tablet by mouth 2 (two) times daily. (Patient not taking: Reported on 07/03/2021) 28 tablet 1   No current facility-administered medications on file prior to visit.    No Known Allergies  Lab Results HIV 1 RNA Quant (Copies/mL)  Date Value  10/19/2021 25 (H)  08/03/2021 46,800 (H)  07/26/2020 <20 (H)   CD4 T Cell Abs (/uL)  Date Value  07/26/2020 978  04/10/2020 801  11/22/2019 574   No results found for: "HIV1GENOSEQ" Lab Results  Component Value Date   WBC 7.8 10/19/2021   HGB 15.4 10/19/2021   HCT 47.3 10/19/2021   MCV 96.5 10/19/2021   PLT 255 10/19/2021    Lab Results  Component Value Date   CREATININE 1.00 10/19/2021   BUN 15 10/19/2021   NA 142 10/19/2021   K 3.9 10/19/2021   CL 104 10/19/2021   CO2 31 10/19/2021   Lab Results  Component Value Date   ALT 16 10/19/2021   AST 17  10/19/2021   ALKPHOS 69 04/12/2020   BILITOT 0.5 10/19/2021    Lab Results  Component Value Date   CHOL 138 08/03/2021   TRIG 49 08/03/2021   HDL 56 08/03/2021   LDLCALC 68 08/03/2021   Lab Results  Component Value Date   HAV NON-REACTIVE 08/03/2021   Lab Results  Component Value Date   HEPBSAG NON-REACTIVE 08/03/2021   HEPBSAB REACTIVE (A) 08/03/2021   Lab Results  Component Value Date   HCVAB NON REACTIVE 04/10/2020   Lab Results  Component Value Date   CHLAMYDIAWP Negative 10/05/2021   N Negative 10/05/2021   No results found for: "GCPROBEAPT" No results found for: "QUANTGOLD"  Plan #HIV   #Vaccination COVID Flu Monkeypox PCV Meningitis HepA/HEpB Tdap Shingles  #Health maintenance -Quantiferon -RPR -HCV -Dysplasia screen F/M -Mammogram  -Colonoscopy    Laurice Record, MD [place] for Infectious Disease [place]

## 2022-10-30 ENCOUNTER — Ambulatory Visit: Payer: Self-pay | Admitting: Internal Medicine

## 2022-10-30 NOTE — Progress Notes (Deleted)
[facility] for Infectious Disease    HPI: Ethan Goodwin is a 32 y.o. male 3 with HIV (VL 46,800 on 08/03/21, Cd4 978 on 07/26/20) on Dovato. Presents for HIV care. RPR was 1:16 on 08/03/21, has not recieved first shot.  He is unclear when he was exposed. Reports having foul smelling penile discharge for the past month. He participates in sex work, mostly oral.  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 He has missed multiple doses of Dovato this month. He states this is due to pharmacy issue, which is now fixed.    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  Anal sex receptive***, insertive***. 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 Current Outpatient Medications on File Prior to Visit  Medication Sig Dispense Refill   DOVATO 50-300 MG tablet TAKE 1 TABLET BY MOUTH DAILY 30 tablet 0   nicotine (NICODERM CQ - DOSED IN MG/24 HOURS) 14 mg/24hr patch Place 1 patch (14 mg total) onto the skin daily as needed (nicotine withdrawal). (Patient not taking: Reported on 03/29/2021) 28 patch 0   sulfamethoxazole-trimethoprim (BACTRIM DS) 800-160 MG tablet Take 1 tablet by mouth 2 (two) times daily. (Patient not taking: Reported on 07/03/2021) 28 tablet 1   No current facility-administered medications on file prior to visit.    No Known Allergies   Lab Results HIV 1 RNA Quant (Copies/mL)  Date Value  10/19/2021 25 (H)  08/03/2021 46,800 (H)  07/26/2020 <20 (H)   CD4 T Cell Abs (/uL)  Date Value  07/26/2020 978  04/10/2020 801   11/22/2019 574   No results found for: "HIV1GENOSEQ" Lab Results  Component Value Date   WBC 7.8 10/19/2021   HGB 15.4 10/19/2021   HCT 47.3 10/19/2021   MCV 96.5 10/19/2021   PLT 255 10/19/2021    Lab Results  Component Value Date   CREATININE 1.00 10/19/2021   BUN 15 10/19/2021   NA 142 10/19/2021   K 3.9 10/19/2021   CL 104 10/19/2021   CO2 31 10/19/2021   Lab Results  Component Value Date   ALT 16 10/19/2021   AST 17 10/19/2021   ALKPHOS 69 04/12/2020   BILITOT 0.5 10/19/2021    Lab Results  Component Value Date   CHOL 138 08/03/2021   TRIG 49 08/03/2021   HDL 56 08/03/2021   LDLCALC 68 08/03/2021   Lab Results  Component Value Date   HAV NON-REACTIVE 08/03/2021   Lab Results  Component Value Date   HEPBSAG NON-REACTIVE 08/03/2021   HEPBSAB REACTIVE (A) 08/03/2021   Lab Results  Component Value Date   HCVAB NON REACTIVE 04/10/2020   Lab Results  Component Value Date   CHLAMYDIAWP Negative 10/05/2021   N Negative 10/05/2021   No results found for: "GCPROBEAPT" No results found for: "QUANTGOLD"  Plan HIV -Continue Dovato. Counseled on adherence -Reports foul smelling penile discharge. Engages in sex work(oral). Will administer ceftriaxone and Rx doxy empirically.  -HIV labs today, GC three point, RPR -Follow-up in 3 months   Syphilis-Late latent -Bicillin 2.4MU  weekly x3 weeks -1st dose today   #IVDA with heroin -Currently no interested in cessation -Plans to speak with [facility]   #Vaccination COVID-needs booster(pt will make the appointment) Flu-order today Monkeypox-information provided PCV 13 10/20/19-needs 23(order today) Meningitis-next visit HepA-non-immune(next visit) HEpB-immune     #Health maintenance -Quantiferon-order today -HCV-Ab NR on 08/03/21    Laurice Record, MD [facility] for Infectious [facility]

## 2022-12-05 ENCOUNTER — Other Ambulatory Visit (HOSPITAL_BASED_OUTPATIENT_CLINIC_OR_DEPARTMENT_OTHER): Payer: Self-pay

## 2022-12-05 ENCOUNTER — Encounter (HOSPITAL_BASED_OUTPATIENT_CLINIC_OR_DEPARTMENT_OTHER): Payer: Self-pay

## 2022-12-05 ENCOUNTER — Other Ambulatory Visit: Payer: Self-pay

## 2022-12-05 ENCOUNTER — Emergency Department (HOSPITAL_BASED_OUTPATIENT_CLINIC_OR_DEPARTMENT_OTHER)
Admission: EM | Admit: 2022-12-05 | Discharge: 2022-12-05 | Disposition: A | Discharge status: Home / Self Care | Payer: Self-pay | Attending: Emergency Medicine | Admitting: Emergency Medicine

## 2022-12-05 ENCOUNTER — Emergency Department (HOSPITAL_BASED_OUTPATIENT_CLINIC_OR_DEPARTMENT_OTHER): Payer: Self-pay

## 2022-12-05 DIAGNOSIS — R1084 Generalized abdominal pain: Secondary | ICD-10-CM | POA: Insufficient documentation

## 2022-12-05 DIAGNOSIS — R1110 Vomiting, unspecified: Secondary | ICD-10-CM

## 2022-12-05 DIAGNOSIS — Z21 Asymptomatic human immunodeficiency virus [HIV] infection status: Secondary | ICD-10-CM | POA: Insufficient documentation

## 2022-12-05 DIAGNOSIS — R112 Nausea with vomiting, unspecified: Secondary | ICD-10-CM | POA: Insufficient documentation

## 2022-12-05 DIAGNOSIS — K529 Noninfective gastroenteritis and colitis, unspecified: Secondary | ICD-10-CM

## 2022-12-05 LAB — COMPREHENSIVE METABOLIC PANEL
ALT: 39 U/L (ref 0–44)
AST: 28 U/L (ref 15–41)
Albumin: 4.9 g/dL (ref 3.5–5.0)
Alkaline Phosphatase: 81 U/L (ref 38–126)
Anion gap: 11 (ref 5–15)
BUN: 26 mg/dL — ABNORMAL HIGH (ref 6–20)
CO2: 27 mmol/L (ref 22–32)
Calcium: 10.5 mg/dL — ABNORMAL HIGH (ref 8.9–10.3)
Chloride: 100 mmol/L (ref 98–111)
Creatinine, Ser: 1.14 mg/dL (ref 0.61–1.24)
GFR, Estimated: >60 mL/min (ref >60)
Glucose, Bld: 120 mg/dL — ABNORMAL HIGH (ref 70–99)
Potassium: 3.1 mmol/L — ABNORMAL LOW (ref 3.5–5.1)
Sodium: 138 mmol/L (ref 135–145)
Total Bilirubin: 1.6 mg/dL — ABNORMAL HIGH (ref 0.3–1.2)
Total Protein: 10.2 g/dL — ABNORMAL HIGH (ref 6.5–8.1)

## 2022-12-05 LAB — URINALYSIS, ROUTINE W REFLEX MICROSCOPIC
Bacteria, UA: NONE SEEN
Bilirubin Urine: NEGATIVE
Glucose, UA: NEGATIVE mg/dL
Hgb urine dipstick: NEGATIVE
Ketones, ur: NEGATIVE mg/dL
Leukocytes,Ua: NEGATIVE
Nitrite: NEGATIVE
Protein, ur: 30 mg/dL — AB
Specific Gravity, Urine: >1.046 — ABNORMAL HIGH (ref 1.005–1.030)
pH: 6 (ref 5.0–8.0)

## 2022-12-05 LAB — CBC WITH DIFFERENTIAL/PLATELET
Abs Immature Granulocytes: 0.05 10*3/uL (ref 0.00–0.07)
Basophils Absolute: 0 10*3/uL (ref 0.0–0.1)
Basophils Relative: 0 %
Eosinophils Absolute: 0 10*3/uL (ref 0.0–0.5)
Eosinophils Relative: 0 %
HCT: 49.1 % (ref 39.0–52.0)
Hemoglobin: 16.4 g/dL (ref 13.0–17.0)
Immature Granulocytes: 1 %
Lymphocytes Relative: 6 %
Lymphs Abs: 0.6 10*3/uL — ABNORMAL LOW (ref 0.7–4.0)
MCH: 31.1 pg (ref 26.0–34.0)
MCHC: 33.4 g/dL (ref 30.0–36.0)
MCV: 93.2 fL (ref 80.0–100.0)
Monocytes Absolute: 0.3 10*3/uL (ref 0.1–1.0)
Monocytes Relative: 4 %
Neutro Abs: 7.9 10*3/uL — ABNORMAL HIGH (ref 1.7–7.7)
Neutrophils Relative %: 89 %
Platelets: 225 10*3/uL (ref 150–400)
RBC: 5.27 MIL/uL (ref 4.22–5.81)
RDW: 12.6 % (ref 11.5–15.5)
WBC: 8.9 10*3/uL (ref 4.0–10.5)
nRBC: 0 % (ref 0.0–0.2)

## 2022-12-05 LAB — RAPID URINE DRUG SCREEN, HOSP PERFORMED
Amphetamines: POSITIVE — AB
Barbiturates: NOT DETECTED
Benzodiazepines: NOT DETECTED
Cocaine: NOT DETECTED
Opiates: NOT DETECTED
Tetrahydrocannabinol: NOT DETECTED

## 2022-12-05 LAB — LIPASE, BLOOD: Lipase: 12 U/L (ref 11–51)

## 2022-12-05 MED ORDER — DICYCLOMINE HCL 20 MG PO TABS: 20.0000 mg | ORAL_TABLET | Freq: Two times a day (BID) | ORAL | 0 refills | Status: DC | PRN

## 2022-12-05 MED ORDER — KETOROLAC TROMETHAMINE 30 MG/ML IJ SOLN
30.0000 mg | Freq: Once | INTRAMUSCULAR | Status: AC
Start: 2022-12-05 — End: 2022-12-05
  Administered 2022-12-05: 30 mg via INTRAVENOUS
  Filled 2022-12-05: qty 1

## 2022-12-05 MED ORDER — LACTATED RINGERS IV BOLUS
1000.0000 mL | Freq: Once | INTRAVENOUS | Status: AC
  Administered 2022-12-05: 1000 mL via INTRAVENOUS

## 2022-12-05 MED ORDER — IOHEXOL 300 MG/ML  SOLN
100.0000 mL | Freq: Once | INTRAMUSCULAR | Status: AC | PRN
  Administered 2022-12-05: 80 mL via INTRAVENOUS

## 2022-12-05 MED ORDER — ONDANSETRON 4 MG PO TBDP: 4.0000 mg | ORAL_TABLET | Freq: Three times a day (TID) | ORAL | 0 refills | Status: DC | PRN

## 2022-12-05 MED ORDER — SODIUM CHLORIDE 0.9 % IV BOLUS
1000.0000 mL | Freq: Once | INTRAVENOUS | Status: AC
Start: 2022-12-05 — End: 2022-12-05
  Administered 2022-12-05: 1000 mL via INTRAVENOUS

## 2022-12-05 MED ORDER — ONDANSETRON HCL 4 MG/2ML IJ SOLN
4.0000 mg | Freq: Once | INTRAMUSCULAR | Status: AC
Start: 2022-12-05 — End: 2022-12-05
  Administered 2022-12-05: 4 mg via INTRAVENOUS
  Filled 2022-12-05: qty 2

## 2022-12-05 MED ORDER — MORPHINE SULFATE (PF) 4 MG/ML IV SOLN
4.0000 mg | Freq: Once | INTRAVENOUS | Status: DC
  Filled 2022-12-05: qty 1

## 2022-12-05 MED ORDER — POTASSIUM CHLORIDE CRYS ER 20 MEQ PO TBCR
40.0000 meq | EXTENDED_RELEASE_TABLET | Freq: Once | ORAL | Status: AC
  Administered 2022-12-05: 40 meq via ORAL
  Filled 2022-12-05: qty 2

## 2022-12-05 NOTE — Discharge Instructions (Signed)
You have been seen in the Emergency Department (ED)  today for abdominal pain, nausea and vomiting, and diarrhea.  Your work up today has not shown a clear cause for your symptoms, but your CT scan does suggest what we called gastroenteritis which is typically a viral infection that can cause the symptoms. You have been prescribed Zofran; please use as prescribed as needed for your nausea.  You can use the Bentyl as needed for abdominal cramping with Tylenol and ibuprofen.  Follow up with your doctor as soon as possible, ideally within one week, regarding today's emergent visit and your symptoms of nausea/vomiting.  Make an appointment with gastroenterology listed in your discharge paperwork regarding your visit to the ER today.  Return to the Emergency Department (ED)  if you develop severe abdominal pain, bloody vomiting, bloody diarrhea, if you are unable to tolerate fluids due to vomiting, or if you develop other symptoms that concern you.

## 2022-12-05 NOTE — ED Provider Notes (Addendum)
  Physical Exam  BP 116/76   Pulse (!) 113   Temp 98 F (36.7 C)   Resp 20   Ht '5\' 6"'$  (1.676 m)   Wt 60.8 kg   SpO2 96%   BMI 21.63 kg/m   Physical Exam Pulmonary:     Effort: Pulmonary effort is normal.  Abdominal:     General: Abdomen is flat. Bowel sounds are normal.     Palpations: Abdomen is soft.     Tenderness: There is no abdominal tenderness.  Neurological:     Mental Status: He is alert.     Procedures  Procedures  ED Course / MDM   Clinical Course as of 12/05/22 1025  Thu Dec 05, 2022  0658 30 M with IVDU, HIV presenting with abdominal pain, vomiting and diarrhea. No emesis since presentation. CTAP c/f SBO. [VB]  0726 Reassessed patient, lying on side no acute distress appears intoxicated although no IV opiates have been given.  Denying any current pain.  Denying any vomiting.  Abdomen is soft and benign.  No history of abdominal surgeries.  General surgery paged regarding findings on CT concerning for SBO. [VB]  0801 Spoke to on-call surgery physician assistant, will review case with on-call attending who is currently in surgery.  PA has reviewed imaging and did not see obvious transition point. [VB]  0901 Spoke with surgery who has reviewed patient's workup extensively.  Dr. Ninfa Linden was the attending that reviewed.  They do not think this is surgical or requiring a surgical consult or admission.  Suspect more likely but gastroenteritis.  Will plan on p.o. challenge.  I do agree with this as patient's abdominal exam was benign and he was denying any pain or vomiting on my exam there is no distention or tenderness. [VB]  [id number] Patient tolerating p.o. without vomiting or diarrhea.  Holding off from antibiotics at this time as I do not suspect bacterial infection despite untreated HIV white blood cells 8.9 and normal.   discharging patient with as needed Bentyl, Zofran and some extra potassium pills.  Advise close follow-up with PCP/GI.  Patient currently safe for  discharge. [VB]    Clinical Course User Index [VB] Elgie Congo, MD   Medical Decision Making Amount and/or Complexity of Data Reviewed Labs: ordered. Radiology: ordered.  Risk Prescription drug management.          Elgie Congo, MD 12/05/22 1024    Elgie Congo, MD 12/05/22 [phone]

## 2022-12-05 NOTE — ED Triage Notes (Signed)
Cc: abdominal pain, "like bees stinging me in my stomach"  patient c/o 2 hours of n/ with vomiting and diarrhea.

## 2022-12-05 NOTE — ED Notes (Signed)
Patient in CT

## 2022-12-05 NOTE — ED Notes (Signed)
PO fluid given. Tolerated. No vomiting.

## 2022-12-05 NOTE — ED Provider Notes (Signed)
[facility] Provider Note   CSN: [id number] Arrival date & time: 12/05/22  9767     History  Chief Complaint  Patient presents with   Abdominal Pain   Nausea   Emesis    Ethan Goodwin is a 32 y.o. male.  Patient is a 32 year old male with past medical history of HIV disease, IV drug abuse, syphilis.  Patient presenting today for evaluation of abdominal pain for the past 4 months.  Ethan Goodwin describes a sensation of "bees stinging me in my abdomen".  Pain has been constant for the past 4 months, but presents tonight because "I cannot take it no more".  Ethan Goodwin describes "lots of diarrhea and lots of vomiting ".  Ethan Goodwin denies bloody stool.  No aggravating or alleviating factors.  Ethan Goodwin tells me Ethan Goodwin last used IV drugs yesterday.  The history is provided by the patient.       Home Medications Prior to Admission medications   Medication Sig Start Date End Date Taking? Authorizing Provider  DOVATO 50-300 MG tablet TAKE 1 TABLET BY MOUTH DAILY 07/15/22   Laurice Record, MD  nicotine (NICODERM CQ - DOSED IN MG/24 HOURS) 14 mg/24hr patch Place 1 patch (14 mg total) onto the skin daily as needed (nicotine withdrawal). Patient not taking: Reported on 03/29/2021 04/12/20   Lavina Hamman, MD  sulfamethoxazole-trimethoprim (BACTRIM DS) 800-160 MG tablet Take 1 tablet by mouth 2 (two) times daily. Patient not taking: Reported on 07/03/2021 03/29/21   Campbell Riches, MD      Allergies    Patient has no known allergies.    Review of Systems   Review of Systems  All other systems reviewed and are negative.   Physical Exam Updated Vital Signs BP 119/85 (BP Location: Left Arm)   Pulse (!) 108   Temp 98.1 F (36.7 C) (Axillary)   Resp (!) 22   Ht 5\' 6"  (1.676 m)   Wt 60.8 kg   SpO2 98%   BMI 21.63 kg/m  Physical Exam Vitals and nursing note reviewed.  Constitutional:      General: Ethan Goodwin is not in acute distress.    Appearance: Ethan Goodwin is well-developed. Ethan Goodwin  is not diaphoretic.  HENT:     Head: Normocephalic and atraumatic.  Cardiovascular:     Rate and Rhythm: Normal rate and regular rhythm.     Heart sounds: No murmur heard.    No friction rub.  Pulmonary:     Effort: Pulmonary effort is normal. No respiratory distress.     Breath sounds: Normal breath sounds. No wheezing or rales.  Abdominal:     General: Bowel sounds are normal. There is no distension.     Palpations: Abdomen is soft.     Tenderness: There is generalized abdominal tenderness. There is no right CVA tenderness, left CVA tenderness, guarding or rebound.  Musculoskeletal:        General: Normal range of motion.     Cervical back: Normal range of motion and neck supple.  Skin:    General: Skin is warm and dry.  Neurological:     Mental Status: Ethan Goodwin is alert and oriented to person, place, and time.     Coordination: Coordination normal.     ED Results / Procedures / Treatments   Labs (all labs ordered are listed, but only abnormal results are displayed) Labs Reviewed - No data to display  EKG None  Radiology No results found.  Procedures Procedures  Medications Ordered in ED Medications  sodium chloride 0.9 % bolus 1,000 mL (has no administration in time range)  ondansetron (ZOFRAN) injection 4 mg (has no administration in time range)  ketorolac (TORADOL) 30 MG/ML injection 30 mg (has no administration in time range)    ED Course/ Medical Decision Making/ A&P Clinical Course as of 12/06/22 0146  Thu Dec 05, 2022  0658 23 M with IVDU, HIV presenting with abdominal pain, vomiting and diarrhea. No emesis since presentation. CTAP c/f SBO. [VB]  0726 Reassessed patient, lying on side no acute distress appears intoxicated although no IV opiates have been given.  Denying any current pain.  Denying any vomiting.  Abdomen is soft and benign.  No history of abdominal surgeries.  General surgery paged regarding findings on CT concerning for SBO. [VB]  0801 Spoke to  on-call surgery physician assistant, will review case with on-call attending who is currently in surgery.  PA has reviewed imaging and did not see obvious transition point. [VB]  0901 Spoke with surgery who has reviewed patient's workup extensively.  Dr. Ninfa Linden was the attending that reviewed.  They do not think this is surgical or requiring a surgical consult or admission.  Suspect more likely but gastroenteritis.  Will plan on p.o. challenge.  I do agree with this as patient's abdominal exam was benign and Ethan Goodwin was denying any pain or vomiting on my exam there is no distention or tenderness. [VB]  2263 Patient tolerating p.o. without vomiting or diarrhea.  Holding off from antibiotics at this time as I do not suspect bacterial infection despite untreated HIV white blood cells 8.9 and normal.   discharging patient with as needed Bentyl, Zofran and some extra potassium pills.  Advise close follow-up with PCP/GI.  Patient currently safe for discharge. [VB]    Clinical Course User Index [VB] Elgie Congo, MD  Patient with history of HIV disease and IV drug abuse presenting with complaints of abdominal pain as described in the HPI.  Patient arrives here with stable vital signs.  Ethan Goodwin has mild generalized abdominal tenderness, but no distention or peritoneal signs.  Laboratory studies are basically unremarkable.  Patient sent for a CT scan of the abdomen and pelvis that appears to show a small bowel obstruction.  Patient has received IV fluids along with Toradol and Zofran for pain and nausea.  Care will be signed out to oncoming provider at shift change.  Surgical consultation and disposition pending.    Final Clinical Impression(s) / ED Diagnoses Final diagnoses:  None    Rx / DC Orders ED Discharge Orders     None         Veryl Speak, MD 12/06/22 [phone]

## 2022-12-07 ENCOUNTER — Ambulatory Visit (HOSPITAL_COMMUNITY)
Admission: EM | Admit: 2022-12-07 | Discharge: 2022-12-08 | Disposition: A | Discharge status: Home / Self Care | Payer: No Payment, Other | Attending: Nurse Practitioner | Admitting: Nurse Practitioner

## 2022-12-07 DIAGNOSIS — F333 Major depressive disorder, recurrent, severe with psychotic symptoms: Secondary | ICD-10-CM

## 2022-12-07 MED ORDER — LAMOTRIGINE 25 MG PO TABS: ORAL_TABLET | ORAL | 0 refills | Status: DC

## 2022-12-07 NOTE — ED Triage Notes (Signed)
Pt presents to [facility] voluntarily with uncle. Pt reports severe hallucinations. Pt reports he has been experincing visual and auditory hallunciations for over a year. Pt reports hearing previous conversations with his mother and seeing shadows. Pt reports meeting people he already knows "deja vu". Pt does not endorse SI/HI. Pt has no history of self harm or Suicidal attemps. Pt does report regular meth use. Pt does report feeling down and depressed ue to hallucinations. Pt is routine.

## 2022-12-07 NOTE — Discharge Instructions (Addendum)
  Discharge recommendations:  Patient is to take medications as prescribed. Please see information for follow-up appointment with psychiatry and therapy. Please follow up with [facility] outpatient clinic for ongoing medication management and therapy.  Therapy: We recommend that patient participate in individual therapy to address mental health concerns.  Medications: The patient or guardian is to contact a medical professional and/or outpatient provider to address any new side effects that develop. The patient or guardian should update outpatient providers of any new medications and/or medication changes.   Atypical antipsychotics: If you are prescribed an atypical antipsychotic, it is recommended that your height, weight, BMI, blood pressure, fasting lipid panel, and fasting blood sugar be monitored by your outpatient providers.  Safety:  The patient should abstain from use of illicit substances/drugs and abuse of any medications. If symptoms worsen or do not continue to improve or if the patient becomes actively suicidal or homicidal then it is recommended that the patient return to the closest hospital emergency department, the [facility], or call 911 for further evaluation and treatment. National Suicide Prevention Lifeline 1-800-SUICIDE or 223-189-2245.  About 988 988 offers 24/7 access to trained crisis counselors who can help people experiencing mental health-related distress. People can call or text 988 or chat 988lifeline.org for themselves or if they are worried about a loved one who may need crisis support.  Crisis Mobile: [facility]:                     [phone] (for crisis response 24 hours a day) [place]:                                            [phone]
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
## 2023-01-29 ENCOUNTER — Ambulatory Visit: Payer: Self-pay | Admitting: Internal Medicine

## 2023-01-29 NOTE — Progress Notes (Deleted)
[facility]     HPI: Quadir Muns is a 32 y.o. male with HIV (VL 46,800 on 08/03/21, Cd4 978 on 07/26/20) on Dovato. Presents for HIV care. RPR was 1:16 on 08/03/21, has not recieved first shot.  He is unclear when he was exposed. Reports having foul smelling penile discharge for the past month. He participates in sex work, mostly oral.  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 He has missed multiple doses of Dovato this month. He states this is due to pharmacy issue, which is now fixed.     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 Current Outpatient Medications on File Prior to Visit  Medication Sig Dispense Refill   dicyclomine (BENTYL) 20 MG tablet Take 1 tablet (20 mg total) by mouth 2 (two) times daily as needed (abdominal cramping and pain). 20 tablet 0   DOVATO 50-300 MG tablet TAKE 1 TABLET BY MOUTH DAILY 30 tablet 0   lamoTRIgine (LAMICTAL) 25 MG tablet Take 1 tablet (25 mg total) by mouth daily for 14 days, THEN 2 tablets (50 mg total) daily for 16 days. 46 tablet 0   nicotine (NICODERM CQ - DOSED IN MG/24 HOURS) 14 mg/24hr patch Place 1 patch (14 mg total) onto the skin daily as needed (nicotine withdrawal). (Patient not taking: Reported on 03/29/2021) 28 patch 0   ondansetron (ZOFRAN-ODT) 4 MG disintegrating tablet Take 1 tablet (4 mg total) by mouth every 8 (eight) hours as needed. 20 tablet 0   sulfamethoxazole-trimethoprim (BACTRIM DS) 800-160 MG tablet Take 1 tablet by mouth 2 (two) times daily. (Patient not taking: Reported on 07/03/2021) 28 tablet 1   No current facility-administered medications on file prior to visit.    No Known Allergies    Lab Results HIV 1 RNA Quant  (Copies/mL)  Date Value  10/19/2021 25 (H)  08/03/2021 46,800 (H)  07/26/2020 <20 (H)   CD4 T Cell Abs (/uL)  Date Value  07/26/2020 978  04/10/2020 801  11/22/2019 574   No results found for: "HIV1GENOSEQ" Lab Results  Component Value Date   WBC 8.9 12/05/2022   HGB 16.4 12/05/2022   HCT 49.1 12/05/2022   MCV 93.2 12/05/2022   PLT 225 12/05/2022    Lab Results  Component Value Date   CREATININE 1.14 12/05/2022   BUN 26 (H) 12/05/2022   NA 138 12/05/2022   K 3.1 (L) 12/05/2022   CL 100 12/05/2022   CO2 27 12/05/2022   Lab Results  Component Value Date   ALT 39 12/05/2022   AST 28 12/05/2022   ALKPHOS 81 12/05/2022   BILITOT 1.6 (H) 12/05/2022    Lab Results  Component Value Date   CHOL 138 08/03/2021   TRIG 49 08/03/2021   HDL 56 08/03/2021   LDLCALC 68 08/03/2021   Lab Results  Component Value Date   HAV NON-REACTIVE 08/03/2021   Lab Results  Component Value Date   HEPBSAG NON-REACTIVE 08/03/2021   HEPBSAB REACTIVE (A) 08/03/2021   Lab Results  Component Value Date   HCVAB NON REACTIVE 04/10/2020   Lab Results  Component Value Date   CHLAMYDIAWP Negative 10/05/2021   N Negative 10/05/2021  No results found for: "GCPROBEAPT" No results found for: "QUANTGOLD"  Assessment/Plan HIV -Continue Dovato. Counseled on adherence -Reports foul smelling penile discharge. Engages in sex work(oral). Will administer ceftriaxone and Rx doxy empirically.  -HIV labs today, GC three point, RPR -Follow-up in 3 months   Syphilis-Late latent -Bicillin 2.4MU weekly x3 weeks -1st dose today   #IVDA with heroin -Currently no interested in cessation -Plans to speak with [facility]   #Vaccination COVID-needs booster(pt will make the appointment) Flu-order today Monkeypox-information provided PCV 13 10/20/19-needs 23(order today) Meningitis-next visit HepA-non-immune(next visit) HEpB-immune      Danelle Earthly, MD [facility]

## 2023-03-12 ENCOUNTER — Telehealth: Payer: Self-pay

## 2023-03-12 ENCOUNTER — Other Ambulatory Visit: Payer: Self-pay | Admitting: Internal Medicine

## 2023-03-12 DIAGNOSIS — B20 Human immunodeficiency virus [HIV] disease: Secondary | ICD-10-CM

## 2023-03-12 NOTE — Telephone Encounter (Signed)
Attempted to call patient to schedule overdue appointment. Not able to reach him at this time. Left voicemail requesting call back to schedule follow up.  Refills denied pending appt. Juanita Laster, RMA

## 2023-05-06 ENCOUNTER — Other Ambulatory Visit: Payer: Self-pay

## 2023-05-06 ENCOUNTER — Ambulatory Visit (INDEPENDENT_AMBULATORY_CARE_PROVIDER_SITE_OTHER): Admitting: Internal Medicine

## 2023-05-06 ENCOUNTER — Encounter: Payer: Self-pay | Admitting: Internal Medicine

## 2023-05-06 VITALS — BP 123/83 | HR 81 | Temp 97.8°F

## 2023-05-06 DIAGNOSIS — B20 Human immunodeficiency virus [HIV] disease: Secondary | ICD-10-CM

## 2023-05-06 NOTE — Patient Instructions (Signed)
Pt declined labs,immunization and restarting dovato. He is amenable to follow up in one month to discuss HIV care.

## 2023-05-06 NOTE — Progress Notes (Signed)
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   DOVATO 50-300 MG TABLET    TAKE 1 TABLET BY MOUTH DAILY   LAMOTRIGINE (LAMICTAL) 25 MG TABLET    Take 1 tablet (25 mg total) by mouth daily for 14 days, THEN 2 tablets (50 mg total) daily for 16 days.   NICOTINE (NICODERM CQ - DOSED IN MG/24 HOURS) 14 MG/24HR PATCH    Place 1 patch (14 mg total) onto the skin daily as needed (nicotine withdrawal).   ONDANSETRON (ZOFRAN-ODT) 4 MG DISINTEGRATING TABLET    Take 1 tablet (4 mg total) by mouth every 8 (eight) hours as needed.   SULFAMETHOXAZOLE-TRIMETHOPRIM (BACTRIM DS) 800-160 MG TABLET    Take 1 tablet by mouth 2 (two) times daily.  Modified Medications   No medications on file  Discontinued Medications   No medications on file    Subjective: 30 YM with HIV (VL 46,800 on 08/03/21, Cd4 978 on 07/26/20) on Dovato. Presents for HIV care. RPR was 1:16 on 08/03/21, has not recieved first shot.  He is unclear when he was exposed. Reports having foul smelling penile discharge for the past month. He participates in sex work, mostly oral.  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 He has missed multiple doses of Dovato this month. He states this is due to pharmacy issue, which is now fixed.   Today 05/06/23: Pt is incarcerated, presnts from dtention center. HE states its been a yer since he has taken dovato. " Just dont want to take it" Review of Systems: Review of Systems  All other systems reviewed and are negative.   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  No Known Allergies  Health Maintenance  Topic Date Due   DTaP/Tdap/Td (1 - Tdap) Never done   COVID-19 Vaccine (4 - 2023-24 season) 05/31/2022   INFLUENZA VACCINE  05/01/2023   Hepatitis C Screening  Completed   HIV Screening  Completed   HPV VACCINES  Aged Out    Objective:  There were no vitals filed for this visit. There is no height or weight on file to calculate BMI.    Lab Results Lab Results  Component Value Date   WBC 8.9 12/05/2022   HGB 16.4 12/05/2022   HCT 49.1 12/05/2022   MCV 93.2 12/05/2022   PLT 225 12/05/2022    Lab Results  Component Value Date   CREATININE 1.14 12/05/2022   BUN 26 (H) 12/05/2022   NA 138 12/05/2022   K 3.1 (L) 12/05/2022   CL 100 12/05/2022   CO2 27 12/05/2022  Lab Results  Component Value Date   ALT 39 12/05/2022   AST 28 12/05/2022   ALKPHOS 81 12/05/2022   BILITOT 1.6 (H) 12/05/2022    Lab Results  Component Value Date   CHOL 138 08/03/2021   HDL 56 08/03/2021   LDLCALC 68 08/03/2021   TRIG 49 08/03/2021   CHOLHDL 2.5 08/03/2021   Lab Results  Component Value Date   LABRPR REACTIVE (A) 10/19/2021   RPRTITER 1:32 (H) 10/19/2021   HIV 1 RNA Quant (Copies/mL)  Date Value  10/19/2021 25 (H)  08/03/2021 46,800 (H)  07/26/2020 <20 (H)   CD4 T Cell Abs (/uL)  Date Value  07/26/2020 978  04/10/2020 801  11/22/2019 574     Problem List Items Addressed This Visit   None  Assessment/Plan #HIV #IVDA with heroin Cd4 1296, VL 25 on 10/19/21 -Has been off of meds for about a year  -Declined  physical exam, meds and labs/immunizaiton. Does not want to restaret docvato. I aked him his  goals form this visit, he stated he wanted to get out(jail) [facility] [employer] and asked if I had cash app..  -Dentist comes to jail.  -Agreeable to F/U in one month to discuss HIV care    #Syphilis-Late latent -Bicillin 2.4MU weekly x3 weeks, complete. RPR 1:16 on 08/03/21->1:32 on1/20/23       #Vaccination COVID-needs booster(pt will make the appointment) Flu-10/05/21 Monkeypox-information provided PCV 13 10/20/19-needs 23 10/05/21 Meningitis-declined HepA-non-immune(next visit) HEpB-immune     #Health maintenance -Quantiferon-order today -HCV-Ab NR on 08/03/21     Danelle Earthly, MD [facility] 05/06/2023, 10:45 AM   I have personally spent 45 minutes involved in face-to-face and non-face-to-face activities for this patient on the day of the visit. Professional time spent includes the following activities: Preparing to see the patient (review of tests), Obtaining and/or reviewing separately obtained history (admission/discharge record), Performing a medically appropriate examination and/or evaluation , Ordering medications/tests/procedures, referring and communicating with other health care professionals, Documenting clinical information in the EMR, Independently interpreting results (not separately reported), Communicating results to the patient/family/caregiver, Counseling and educating the patient/family/caregiver and Care coordination (not separately reported).

## 2023-06-09 ENCOUNTER — Encounter: Payer: Self-pay | Admitting: Internal Medicine

## 2023-06-09 ENCOUNTER — Telehealth: Payer: Self-pay | Admitting: Internal Medicine

## 2023-06-09 ENCOUNTER — Ambulatory Visit: Admitting: Internal Medicine

## 2023-06-09 ENCOUNTER — Other Ambulatory Visit: Payer: Self-pay

## 2023-06-09 VITALS — BP 112/78 | HR 78 | Resp 16 | Ht 66.0 in | Wt 136.0 lb

## 2023-06-09 DIAGNOSIS — Z23 Encounter for immunization: Secondary | ICD-10-CM | POA: Diagnosis not present

## 2023-06-09 DIAGNOSIS — B20 Human immunodeficiency virus [HIV] disease: Secondary | ICD-10-CM
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
## 2023-06-09 NOTE — Patient Instructions (Signed)
Continue Dovato and bactrim HIV labs today  Flu shot today F.U with ID in 3 months
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
## 2023-06-12 LAB — RPR TITER: RPR Titer: 1:2 {titer} — ABNORMAL HIGH

## 2023-06-12 LAB — RPR: RPR Ser Ql: REACTIVE — AB

## 2023-06-12 LAB — HIV-1 RNA QUANT-NO REFLEX-BLD
HIV 1 RNA Quant: 66 {copies}/mL — ABNORMAL HIGH
HIV-1 RNA Quant, Log: 1.82 {Log_copies}/mL — ABNORMAL HIGH

## 2023-06-12 LAB — T PALLIDUM AB: T Pallidum Abs: POSITIVE — AB

## 2023-08-18 ENCOUNTER — Other Ambulatory Visit: Payer: Self-pay

## 2023-08-18 DIAGNOSIS — B20 Human immunodeficiency virus [HIV] disease: Secondary | ICD-10-CM

## 2023-08-18 MED ORDER — DOVATO 50-300 MG PO TABS: 1.0000 | ORAL_TABLET | Freq: Every day | ORAL | 2 refills | Status: DC

## 2023-11-23 ENCOUNTER — Other Ambulatory Visit: Payer: Self-pay | Admitting: Internal Medicine

## 2023-11-23 DIAGNOSIS — B20 Human immunodeficiency virus [HIV] disease: Secondary | ICD-10-CM

## 2023-11-25 ENCOUNTER — Other Ambulatory Visit: Payer: Self-pay

## 2023-11-25 ENCOUNTER — Other Ambulatory Visit (HOSPITAL_COMMUNITY): Payer: Self-pay

## 2023-11-25 DIAGNOSIS — B20 Human immunodeficiency virus [HIV] disease: Secondary | ICD-10-CM

## 2023-11-25 MED ORDER — DOVATO 50-300 MG PO TABS: 1.0000 | ORAL_TABLET | Freq: Every day | ORAL | 1 refills | Status: DC

## 2023-11-26 ENCOUNTER — Other Ambulatory Visit (HOSPITAL_COMMUNITY): Payer: Self-pay

## 2023-12-07 ENCOUNTER — Emergency Department (HOSPITAL_COMMUNITY)
Admission: EM | Admit: 2023-12-07 | Discharge: 2023-12-07 | Disposition: A | Discharge status: Home / Self Care | Attending: Emergency Medicine | Admitting: Emergency Medicine

## 2023-12-07 ENCOUNTER — Encounter (HOSPITAL_COMMUNITY): Payer: Self-pay

## 2023-12-07 ENCOUNTER — Other Ambulatory Visit: Payer: Self-pay

## 2023-12-07 DIAGNOSIS — K130 Diseases of lips: Secondary | ICD-10-CM | POA: Diagnosis not present

## 2023-12-07 DIAGNOSIS — Z21 Asymptomatic human immunodeficiency virus [HIV] infection status: Secondary | ICD-10-CM | POA: Insufficient documentation

## 2023-12-07 MED ORDER — DOXYCYCLINE HYCLATE 100 MG PO TABS
100.0000 mg | ORAL_TABLET | Freq: Once | ORAL | Status: AC
  Administered 2023-12-07: 100 mg via ORAL
  Filled 2023-12-07: qty 1

## 2023-12-07 MED ORDER — DOXYCYCLINE HYCLATE 100 MG PO CAPS: 100.0000 mg | ORAL_CAPSULE | Freq: Two times a day (BID) | ORAL | 0 refills | Status: DC

## 2023-12-07 MED ORDER — SODIUM CHLORIDE 0.9 % IV SOLN
2.0000 g | Freq: Once | INTRAVENOUS | Status: DC
  Filled 2023-12-07: qty 20

## 2023-12-07 MED ORDER — METHYLPREDNISOLONE SODIUM SUCC 125 MG IJ SOLR
125.0000 mg | Freq: Once | INTRAMUSCULAR | Status: AC
  Administered 2023-12-07: 125 mg via INTRAMUSCULAR
  Filled 2023-12-07: qty 2

## 2023-12-07 NOTE — ED Notes (Signed)
 Pt has 3+ lower lip swelling. Pt is able to maintain oral secretions. Pt denies SOB or throat swelling. Pt denies new meds or new products. Pt denies bp meds.

## 2023-12-07 NOTE — ED Triage Notes (Addendum)
 Pt presents with lower lip swelling without pruritus that started about 36 hours ago. It started with a bump on his lower lip that he squeezed and expressed some pus.  He is having some associated cold chills. Denies any swelling in oral pharynx. NAD. Hx of injecting meth, last used 2 days ago.

## 2023-12-07 NOTE — Discharge Instructions (Addendum)
 Soak area 20 minutes 4 times  a day.  Return if symptoms worsen or cahnge

## 2023-12-07 NOTE — ED Notes (Signed)
 Unable to obtain labs in triage. Pt has be stuck 4 times.

## 2023-12-07 NOTE — ED Provider Notes (Signed)
 [facility] AT [facility] Provider Note   CSN: [id number] Arrival date & time: 12/07/23  1150     History  Chief Complaint  Patient presents with   Oral Swelling    Ethan Goodwin is a 33 y.o. male.  Patient complains of an abscess to his lower lip.  Patient reports this morning he woke up with swelling and discomfort.  Patient reports that he did notice a pimple there yesterday.  Patient denies any fever or chills he has not had any cough or congestion.  Patient denies any other area of complaint.  Patient has a past medical history of HIV.  He reports he is compliant with his medications.  Patient is not allergic to any medications.  The history is provided by the patient. No language interpreter was used.       Home Medications Prior to Admission medications   Medication Sig Start Date End Date Taking? Authorizing Provider  dicyclomine (BENTYL) 20 MG tablet Take 1 tablet (20 mg total) by mouth 2 (two) times daily as needed (abdominal cramping and pain). Patient not taking: Reported on 06/09/2023 12/05/22   Mardene Sayer, MD  dolutegravir-lamiVUDine (DOVATO) 50-300 MG tablet Take 1 tablet by mouth daily. 11/25/23   Danelle Earthly, MD  lamoTRIgine (LAMICTAL) 25 MG tablet Take 1 tablet (25 mg total) by mouth daily for 14 days, THEN 2 tablets (50 mg total) daily for 16 days. Patient not taking: Reported on 06/09/2023 12/07/22 01/06/23  Bobbitt, Ysidro Evert E, NP  nicotine (NICODERM CQ - DOSED IN MG/24 HOURS) 14 mg/24hr patch Place 1 patch (14 mg total) onto the skin daily as needed (nicotine withdrawal). Patient not taking: Reported on 03/29/2021 04/12/20   Rolly Salter, MD  ondansetron (ZOFRAN-ODT) 4 MG disintegrating tablet Take 1 tablet (4 mg total) by mouth every 8 (eight) hours as needed. Patient not taking: Reported on 06/09/2023 12/05/22   Mardene Sayer, MD  sulfamethoxazole-trimethoprim (BACTRIM DS) 800-160 MG tablet Take 1 tablet by mouth 2 (two)  times daily. Patient not taking: Reported on 06/09/2023 03/29/21   Ginnie Smart, MD      Allergies    Patient has no known allergies.    Review of Systems   Review of Systems  All other systems reviewed and are negative.   Physical Exam Updated Vital Signs BP (!) 125/90   Pulse (!) 109   Temp 99 F (37.2 C) (Oral)   Resp 20   Ht 5\' 7"  (1.702 m)   Wt 70.3 kg   SpO2 100%   BMI 24.28 kg/m  Physical Exam Vitals and nursing note reviewed.  Constitutional:      Appearance: He is well-developed.  HENT:     Head: Normocephalic.     Mouth/Throat:     Comments: Swollen lower lip pustule center of lower lip draining, was able to express significant amount of drainage. Pulmonary:     Effort: Pulmonary effort is normal.  Abdominal:     General: There is no distension.  Musculoskeletal:        General: Normal range of motion.     Cervical back: Normal range of motion.  Skin:    General: Skin is warm.  Neurological:     Mental Status: He is alert and oriented to person, place, and time.     ED Results / Procedures / Treatments   Labs (all labs ordered are listed, but only abnormal results are displayed) Labs Reviewed  AEROBIC/ANAEROBIC CULTURE  W GRAM STAIN (SURGICAL/DEEP WOUND)  COMPREHENSIVE METABOLIC PANEL  CBC WITH DIFFERENTIAL/PLATELET  URINALYSIS, W/ REFLEX TO CULTURE (INFECTION SUSPECTED)  I-STAT CG4 LACTIC ACID, ED  I-STAT CG4 LACTIC ACID, ED    EKG None  Radiology No results found.  Procedures Procedures    Medications Ordered in ED Medications  cefTRIAXone (ROCEPHIN) 2 g in sodium chloride 0.9 % 100 mL IVPB (2 g Intravenous Patient Refused/Not Given 12/07/23 1636)  doxycycline (VIBRA-TABS) tablet 100 mg (has no administration in time range)  methylPREDNISolone sodium succinate (SOLU-MEDROL) 125 mg/2 mL injection 125 mg (125 mg Intramuscular Given 12/07/23 1254)    ED Course/ Medical Decision Making/ A&P                                 Medical  Decision Making Patient complains of an abscess in the middle of his lower lip.  Amount and/or Complexity of Data Reviewed Labs: ordered. Decision-making details documented in ED Course.    Details: Labs were ordered at the beginning of patient's evaluation at triage he has refused laboratory evaluation.  Risk Prescription drug management. Risk Details: Patient counseled on infection patient refuses IV or IM antibiotic he only wants oral antibiotics.  I counseled patient on the symptoms I will treat him with doxycycline.  He is advised warm compresses 20 minutes 4 times a day he understands he will need to return to the emergency department if he develops fever worsening swelling.           Final Clinical Impression(s) / ED Diagnoses Final diagnoses:  Abscess, lip    Rx / DC Orders ED Discharge Orders          Ordered    doxycycline (VIBRAMYCIN) 100 MG capsule  2 times daily        12/07/23 1659           An After Visit Summary was printed and given to the patient.    Elson Areas, PA-C 12/07/23 1659    Loetta Rough, MD 12/07/23 [phone]

## 2023-12-07 NOTE — ED Provider Triage Note (Signed)
 Emergency Medicine Provider Triage Evaluation Note  Ethan Goodwin , a 33 y.o. male  was evaluated in triage.  Pt complains of lip swelling.  Patient reports swelling of the bottom lip that started yesterday.  States that he squeezed up bump on his bottom lip that had some pus drained out of it.  Since then, the bottom of his lip is continued to swell.  No swelling of the tongue or oropharynx.  No shortness of breath or inability to swallow.  Review of Systems  Positive: Bottom lip swelling Negative: Fevers  Physical Exam  BP 135/88 (BP Location: Right Arm)   Pulse (!) 131   Temp 98.5 F (36.9 C)   Resp 18   Ht 5\' 7"  (1.702 m)   Wt 70.3 kg   SpO2 100%   BMI 24.28 kg/m  Gen:   Awake, no distress   Resp:  Normal effort  MSK:   Moves extremities without difficulty  Other:  Swelling of the bottom lip  Medical Decision Making  Medically screening exam initiated at 12:46 PM.  Appropriate orders placed.  Ethan Goodwin was informed that the remainder of the evaluation will be completed by another provider, this initial triage assessment does not replace that evaluation, and the importance of remaining in the ED until their evaluation is complete.    Maxwell Marion, PA-C 12/07/23 1248

## 2023-12-10 ENCOUNTER — Ambulatory Visit: Payer: Self-pay | Admitting: Infectious Diseases

## 2023-12-11 ENCOUNTER — Ambulatory Visit (HOSPITAL_COMMUNITY): Admission: EM | Admit: 2023-12-11 | Discharge: 2023-12-11 | Disposition: A | Discharge status: Home / Self Care

## 2023-12-11 DIAGNOSIS — R440 Auditory hallucinations: Secondary | ICD-10-CM

## 2023-12-11 DIAGNOSIS — F1510 Other stimulant abuse, uncomplicated: Secondary | ICD-10-CM

## 2023-12-11 NOTE — Discharge Instructions (Addendum)
 Discharge recommendations:   Medications: No new medications prescribed at this visit. Patient is to take medications as prescribed. The patient or patient's guardian is to contact a medical professional and/or outpatient provider to address any new side effects that develop. The patient or the patient's guardian should update outpatient providers of any new medications and/or medication changes.   Outpatient Follow up: Please follow up with [facility]in services at 7:00AM for medication management and therapy services. Please review list of outpatient resources for psychiatry and counseling. Please follow up with your primary care provider for all medical related needs.   Therapy: We recommend that patient participate in individual therapy to address mental health concerns.  Atypical antipsychotics: If you are prescribed an atypical antipsychotic, it is recommended that your height, weight, BMI, blood pressure, fasting lipid panel, and fasting blood sugar be monitored by your outpatient providers.  Safety:   The following safety precautions should be taken:   No sharp objects. This includes scissors, razors, scrapers, and putty knives.   Chemicals should be removed and locked up.   Medications should be removed and locked up.   Weapons should be removed and locked up. This includes firearms, knives and instruments that can be used to cause injury.   The patient should abstain from use of illicit substances/drugs and abuse of any medications.  If symptoms worsen or do not continue to improve or if the patient becomes actively suicidal or homicidal then it is recommended that the patient return to the closest hospital emergency department, the [facility], or call 911 for further evaluation and treatment. National Suicide Prevention Lifeline 1-800-SUICIDE or 412-793-5533.  About 988 988 offers 24/7 access to trained crisis counselors who can help  people experiencing mental health-related distress. People can call or text 988 or chat 988lifeline.org for themselves or if they are worried about a loved one who may need crisis support.    Substance Abuse Resources     SUBSTANCE USE TREATMENT for Medicaid and State Funded/IPRS  [facility] [address] [place] [place], [place], [address] [phone] phone NOTE: [facility] is no longer offering IOP services.  Serves those who are low-income or have no insurance.  [facility] [address], [place], [place], [address] [phone] phone [phone] fax NOTE: Does have Substance Abuse-Intensive Outpatient Program [facility]) as well as transitional housing if eligible.  [facility] [address] [place], [place], [address] [phone] phone [phone] fax  [facility] [phone] [place], [place], [address] [phone] phone [phone] fax    HALFWAY HOUSES:  [facility] [phone]  [employer].oxfordvacancies.[email or web address]     12 STEP PROGRAMS:  Alcoholics Anonymous of [place] SoftwareChalet.be  Narcotics Anonymous of  HitProtect.dk  Al-Anon of [place], [place] www.[place]alanon.org/find-meetings.html  Nar-Anon https://nar-anon.org/find-a-meetin      List of Residential placements:   [facility] in [place]: [phone]  [facility]: [phone]  [place]anelle [place], [place] [phone]: Male and male facility; 30-day program: (uninsured and Medicaid such as Laurena Bering, [place], [place], partners)  [facility]: [phone]; men and women's facility; 28 days; Can have Medicaid tailored plan Tour manager or Partners)  [facility]: [phone] Karoline Caldwell or Larita Fife; 28 day program; must be fully detox; tailored Medicaid or no insurance  [address] in [place], [place]; [phone]; 28  day all males program; no insurance accepted  [facility] Referral in [place]: Gabriel Rung [phone] (no insurance or Medicaid only); 90  days; outpatient services but provide housing in apartments [place]  [facility] Admission: [phone]: Patient must complete phone screening for placement: [place], [place]; 6 month program; uninsured, Medicaid, and [employer].   [facility]: no insurance required; [phone]  [facility]: [phone]; Intake: Molly Maduro; Must fill out application online; Alecia Lemming Delay [phone] x [phone] [facility] in [place], [place]: [phone]; Admissions Coordinators Mr. Maurine Minister or Barron Alvine; 90 day program.  [facility]: [place], [place] [phone]; Co-Ed 9 month to a year program; Online application; Men entry fee is $500 (6-54months);  [employer]: [address] [place], [place] [address]; no fee or insurance required; minimum of 2 years; Highly structured; work based; Intake Coordinator is Thayer Ohm [phone]  [facility] in [place], [place]: [phone]; Fax number is [phone]; website: www.[facility].org; Requires 3-6 page autobiography; 2 year program (18 months and then 58month transitional housing); Admission fee is $300; no insurance needed; work Automotive engineer in [place], [place]: [employer] Desk Staff: Danise Edge [phone]: They have a Men's Regenerations Program 6-49months. Free program; There is an initial $300 fee however, they are willing to work with patients regarding that. Application is online.  [facility]: Admissions [phone] Doran Heater ext [phone]; Any 7-90 day program is out of pocket; 12 month program is free of charge; there is a $275 entry fee; Patient is responsible for own transportation

## 2023-12-11 NOTE — ED Provider Notes (Signed)
 Behavioral Health Urgent Care Medical Screening Exam  Patient Name: Ethan Goodwin MRN: [id number] Date of Evaluation: 12/11/23 Chief Complaint:  "I want to get back on my meds". Diagnosis:  Final diagnoses:  Methamphetamine use (HCC)  Auditory hallucinations    History of Present illness: Ethan Goodwin 33 y.o., male patient presented to [facility] as a voluntary walk in unaccompanied with complaints of auditory hallucinations and wanting to restart Risperdal and Prozac. Pt reports PPH of Schizophrenia and Methamphetamine use.  Scherrie Bateman, is seen face to face by this provider and chart reviewed on 12/11/23.    On evaluation Ethan Goodwin reports "I was just wanting to get back on my meds that were helping with the voices that I hear. They are never really talking to me. It sounds like they are having conversations with other people around me. I was diagnosed with Schizophrenia while I was in jail 8 months ago and they put me on Risperidone and Prozac which worked and made me feel so much better. I wasn't using Meth while I was in jail and the voices were still there. I was clean from Meth for that 8 months but I got released 1 week ago, I started using again. The last time I used was 3 days ago". Pt is requesting outpatient mental health resources as well as resources for substance abuse treatment. He is starting intake with [facility] for substance abuse treatment required for his probation as well. He reports history of self injury by cutting his wrist when 33 yrs old. Denies hx of suicide attempts. He is currently living with a close friend and reports friend is supportive. Currently unemployed since recently being released from jail but plans on finding work once he is set up with outpatient treatment. We discussed importance of stopping the Methamphetamine use due to the health risks, worsening psychotic symptoms, decreased sleep and decreased appetite associated with the substance.   Name of  Substance: Methamphetamine Age of first use: 48  Amount using: $20 worth  Frequency of use: Every couple of days How long using: Relapsed 1 week ago Last used: 3 days ago Method of use: IV Longest period of sobriety: 8 months  During evaluation Ethan Goodwin is sitting up in assessment room, in no acute distress. He is alert & oriented x 4, calm, cooperative and attentive for this assessment.  His mood is euthymic with congruent affect.  He has normal speech and behavior.  Pt does not appear to be responding to internal or external stimuli at this time, but does reports hearing voices talking to other people around him, but denies at this time. No apparent delusional thinking or paranoia. Patient is able to converse coherently, goal directed thoughts, no distractibility, or pre-occupation. He denies suicidal and homicidal ideations, visual hallucinations and paranoia.  Patient answered assessment questions appropriately.    Flowsheet Row ED from 12/11/2023 in [facility] ED from 12/07/2023 in [facility] Emergency Department at [facility] ED from 12/07/2022 in [facility]  C-SSRS RISK CATEGORY No Risk No Risk No Risk       Psychiatric Specialty Exam  Presentation  General Appearance:Appropriate for Environment  Eye Contact:Good  Speech:Clear and Coherent  Speech Volume:Normal  Handedness:Right   Mood and Affect  Mood: Euthymic  Affect: Appropriate   Thought Process  Thought Processes: Coherent; Linear  Descriptions of Associations:Intact  Orientation:Full (Time, Place and Person)  Thought Content:WDL    Hallucinations:Auditory  Ideas of Reference:None  Suicidal Thoughts:No  Homicidal Thoughts:No   Sensorium  Memory: Immediate Good; Recent Good  Judgment: Fair  Insight: Good   Executive Functions  Concentration: Good  Attention Span: Good  Recall: Good  Fund of  Knowledge: Good  Language: Good   Psychomotor Activity  Psychomotor Activity: Restlessness   Assets  Assets: Desire for Improvement; Housing; Physical Health; Social Support; Manufacturing systems engineer; Resilience; Transportation   Sleep  Sleep: Fair  Number of hours:  6   Physical Exam: Physical Exam Vitals and nursing note reviewed.  HENT:     Head: Normocephalic.     Nose: Nose normal.  Eyes:     Extraocular Movements: Extraocular movements intact.  Cardiovascular:     Rate and Rhythm: Normal rate.  Pulmonary:     Effort: Pulmonary effort is normal.  Musculoskeletal:        General: Normal range of motion.     Cervical back: Normal range of motion.  Neurological:     General: No focal deficit present.     Mental Status: He is alert and oriented to person, place, and time.    Review of Systems  Constitutional: Negative.   HENT: Negative.    Eyes: Negative.   Respiratory: Negative.    Cardiovascular: Negative.   Gastrointestinal: Negative.   Genitourinary: Negative.   Musculoskeletal: Negative.   Neurological: Negative.   Endo/Heme/Allergies: Negative.   Psychiatric/Behavioral:  Positive for hallucinations and substance abuse.    Blood pressure 104/77, pulse 98, temperature 97.9 F (36.6 C), temperature source Oral, resp. rate 18, SpO2 100%. There is no height or weight on file to calculate BMI.  Musculoskeletal: Strength & Muscle Tone: within normal limits Gait & Station: normal Patient leans: N/A   [facility] MSE Discharge Disposition for Follow up and Recommendations: Based on my evaluation the patient does not appear to have an emergency medical condition and can be discharged with resources and follow up care in outpatient services for Medication Management, Substance Abuse Intensive Outpatient Program, and Individual Therapy Pt is discharged from Behavioral Health Urgent care with outpatient resources for mental health and substance abuse. Pt is to go  to [facility] tomorrow morning to being outpatient services. He was also provided with a list of residential and outpatient resources for substance abuse. Pt was encouraged and educated on the importance of remaining abstinent from substance use including Methamphetamine due to it exacerbating psychotic symptoms, decreasing sleep and appetite as well as the medical health issues associated with it. Pt verbalized understanding.    Howie Ill, NP 12/11/2023, 5:33 PM

## 2023-12-11 NOTE — Progress Notes (Signed)
   12/11/23 1624  [facility] Triage Screening (Walk-ins at [facility] only)  How Did You Hear About [place]? Self  What Is the Reason for Your Visit/Call Today? Patient presents to the [facility] seeking medications for his auditory hallucinations.  Patient states that he was released from jail a week ago.  He states that he was incarcerated for having possession of a stolen care and B&E.  He states that while he was in jail that he was prescribed medications for his psychosis and he states that he felt normal.  Patient states that prior to being in jail that he had never been treated for psychosis.  He states that he has been hearing voices for a long time, but states that he just tried to play it off and thought that he was "just trippin."  However, he states that once he was on medication that everything changed for him.  Patient states that he has a history of methamoheatmine misuse, but states that he was clean for eight months while he was incarcerated.  However, he states that he relapsed in the past week and has been using $20 worth every other day.  He denies the use of alcohol or other drugs. Patient denies SI/HI, but states that he cut himself at age 38 in a suicide attempt.  Patient states that he has been homeless for the past year and states that he has not been sleeping or eating well and states that he has most likely lost some weight, but he is not sure. Patient denies any history of abuse.  He states that he has minimalsupport.  He has two sisters who live in [place], but he does not have regular contact with them. Patient states that he is here mostly for medications today.  How Long Has This Been Causing You Problems? > than 6 months  Have You Recently Had Any Thoughts About Hurting Yourself? No  Are You Planning to Commit Suicide/Harm Yourself At This time? No  Have you Recently Had Thoughts About Hurting Someone Karolee Ohs? No  Are You Planning To Harm Someone At This Time? No  Physical Abuse Denies  Verbal  Abuse Denies  Sexual Abuse Denies  Exploitation of patient/patient's resources Denies  Self-Neglect Yes, past (Comment)  Possible abuse reported to: Other (Comment) (NA)  Are you currently experiencing any auditory, visual or other hallucinations? Yes  Please explain the hallucinations you are currently experiencing: Auditory and visual. Hearing conversations with mom and seeing shadows. Does not report command hallucinations  Have You Used Any Alcohol or Drugs in the Past 24 Hours? Yes  What Did You Use and How Much? Methamphetamine-1/2 gram  Do you have any current medical co-morbidities that require immediate attention? No  Clinician description of patient physical appearance/behavior: Neat, clean, casually dressed, depressed mood  What Do You Feel Would Help You the Most Today? Treatment for Depression or other mood problem  If access to [facility] was not available, would you have sought care in the Emergency Department? Yes  Determination of Need Routine (7 days)  Options For Referral Medication Management;Chemical Dependency Intensive Outpatient Therapy (CDIOP)

## 2023-12-11 NOTE — ED Notes (Signed)
 Patient discharged home.

## 2023-12-15 NOTE — Progress Notes (Deleted)
   HPI: Ethan Goodwin is a 33 y.o. male who presents to the [facility] pharmacy clinic for HIV follow-up.  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   DOXYCYCLINE (VIBRAMYCIN) 100 MG CAPSULE    Take 1 capsule (100 mg total) by mouth 2 (two) times daily.  Modified Medications   No medications on file  Discontinued Medications   No medications on file    Labs: Lab Results  Component Value Date   HIV1RNAQUANT 66 (H) 06/09/2023   HIV1RNAQUANT 25 (H) 10/19/2021   HIV1RNAQUANT 46,800 (H) 08/03/2021   CD4TABS 978 07/26/2020   CD4TABS 801 04/10/2020   CD4TABS 574 11/22/2019    RPR and STI Lab Results  Component Value Date   LABRPR REACTIVE (A) 06/09/2023   LABRPR REACTIVE (A) 10/19/2021   LABRPR REACTIVE (A) 08/03/2021   LABRPR Reactive (A) 04/10/2020   LABRPR REACTIVE (A) 09/21/2019   RPRTITER 1:2 (H) 06/09/2023   RPRTITER 1:32 (H) 10/19/2021   RPRTITER 1:16 (H) 08/03/2021   RPRTITER 1:32 (H) 09/21/2019    STI Results GC CT  10/05/2021  4:00 PM Negative  Negative   10/05/2021  3:59 PM Positive    Negative  Negative    Negative   07/03/2021  4:33 PM Negative  Negative   09/21/2019 12:17 PM Negative  Negative     Hepatitis B Lab Results  Component Value Date   HEPBSAB REACTIVE (A) 08/03/2021   HEPBSAG NON-REACTIVE 08/03/2021   HEPBCAB NON-REACTIVE 08/03/2021   Hepatitis C Lab Results  Component Value Date   HEPCAB NON-REACTIVE 08/03/2021   Hepatitis A Lab Results  Component Value Date   HAV NON-REACTIVE 08/03/2021   Lipids: Lab Results  Component Value Date   CHOL  138 08/03/2021   TRIG 49 08/03/2021   HDL 56 08/03/2021   CHOLHDL 2.5 08/03/2021   LDLCALC 68 08/03/2021    Current HIV Regimen: Dovato  Assessment: Ethan Goodwin is here today for HIV follow up. He has missed multiple appointments with our clinic and was last seen by Dr. Thedore Mins on 06/09/23. HIV RNA was 66 at that visit. Fill records show that he has consistently been filling his Dovato (slight gap between May and August 2024).   Plan: ***  Ethan Goodwin, PharmD, BCIDP, AAHIVP, CPP Clinical Pharmacist Practitioner Infectious Diseases Clinical Pharmacist [facility] 12/15/2023, 3:53 PM

## 2023-12-16 ENCOUNTER — Ambulatory Visit: Admitting: Pharmacist

## 2023-12-16 DIAGNOSIS — B20 Human immunodeficiency virus [HIV] disease: Secondary | ICD-10-CM

## 2023-12-16 DIAGNOSIS — Z113 Encounter for screening for infections with a predominantly sexual mode of transmission: Secondary | ICD-10-CM

## 2023-12-16 DIAGNOSIS — Z79899 Other long term (current) drug therapy: Secondary | ICD-10-CM

## 2023-12-29 ENCOUNTER — Ambulatory Visit: Admitting: Internal Medicine

## 2024-01-12 ENCOUNTER — Ambulatory Visit: Admitting: Internal Medicine

## 2024-01-20 ENCOUNTER — Emergency Department (HOSPITAL_COMMUNITY)
Admission: EM | Admit: 2024-01-20 | Discharge: 2024-01-20 | Discharge status: Against Medical Advice | Attending: Emergency Medicine | Admitting: Emergency Medicine

## 2024-01-20 ENCOUNTER — Encounter (HOSPITAL_COMMUNITY): Payer: Self-pay | Admitting: Emergency Medicine

## 2024-01-20 ENCOUNTER — Other Ambulatory Visit: Payer: Self-pay

## 2024-01-20 DIAGNOSIS — Z5321 Procedure and treatment not carried out due to patient leaving prior to being seen by health care provider: Secondary | ICD-10-CM | POA: Insufficient documentation

## 2024-01-20 DIAGNOSIS — K0889 Other specified disorders of teeth and supporting structures: Secondary | ICD-10-CM | POA: Diagnosis not present

## 2024-01-20 DIAGNOSIS — M542 Cervicalgia: Secondary | ICD-10-CM | POA: Diagnosis present

## 2024-01-20 NOTE — ED Notes (Signed)
 Pt still unable to be found in lobby. Moved OTF

## 2024-01-20 NOTE — ED Notes (Signed)
 Pt stated he left and then come back

## 2024-01-20 NOTE — ED Provider Triage Note (Signed)
 Emergency Medicine Provider Triage Evaluation Note  Medard Decuir , a 33 y.o. male  was evaluated in triage.  Pt complains of left sided tooth pain and swelling of his lymph nodes.  Denies any difficulty swallowing or breathing.  Denies any fever or chills.  Denies any recent dental work.  Review of Systems  Positive: As above Negative: As above  Physical Exam  BP (!) 133/93   Pulse 78   Temp 98.5 F (36.9 C) (Oral)   Resp 16   Ht 5\' 7"  (1.702 m)   Wt 70.3 kg   SpO2 100%   BMI 24.27 kg/m  Gen:   Awake, no distress   Resp:  Normal effort  MSK:   Moves extremities without difficulty    Medical Decision Making  Medically screening exam initiated at 5:25 PM.  Appropriate orders placed.  Levii Hairfield was informed that the remainder of the evaluation will be completed by another provider, this initial triage assessment does not replace that evaluation, and the importance of remaining in the ED until their evaluation is complete.     Rexie Catena, PA-C 01/20/24 1726

## 2024-01-20 NOTE — ED Notes (Signed)
Pt d/c by provider in triage

## 2024-01-20 NOTE — ED Notes (Signed)
Unable to locate pt in lobby  

## 2024-01-20 NOTE — ED Triage Notes (Signed)
 Pt complains of swollen lymph nodes on left side of neck and dental pain on left side x 1 days. Took tylenol  and ibuprofen 3 hours ago with no relief.

## 2024-01-21 ENCOUNTER — Ambulatory Visit: Admitting: Internal Medicine

## 2024-01-28 ENCOUNTER — Ambulatory Visit: Admitting: Internal Medicine

## 2024-03-02 NOTE — Progress Notes (Unsigned)
   HPI: Ethan Goodwin is a 33 y.o. male who presents to the [facility] pharmacy clinic for HIV follow-up.  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   DOXYCYCLINE  (VIBRAMYCIN ) 100 MG CAPSULE    Take 1 capsule (100 mg total) by mouth 2 (two) times daily.  Modified Medications   No medications on file  Discontinued Medications   No medications on file    Labs: Lab Results  Component Value Date   HIV1RNAQUANT 66 (H) 06/09/2023   HIV1RNAQUANT 25 (H) 10/19/2021   HIV1RNAQUANT 46,800 (H) 08/03/2021   CD4TABS 978 07/26/2020   CD4TABS 801 04/10/2020   CD4TABS 574 11/22/2019    RPR and STI Lab Results  Component Value Date   LABRPR REACTIVE (A) 06/09/2023   LABRPR REACTIVE (A) 10/19/2021   LABRPR REACTIVE (A) 08/03/2021   LABRPR Reactive (A) 04/10/2020   LABRPR REACTIVE (A) 09/21/2019   RPRTITER 1:2 (H) 06/09/2023   RPRTITER 1:32 (H) 10/19/2021   RPRTITER 1:16 (H) 08/03/2021   RPRTITER 1:32 (H) 09/21/2019    STI Results GC CT  10/05/2021  4:00 PM Negative  Negative   10/05/2021  3:59 PM Positive    Negative  Negative    Negative   07/03/2021  4:33 PM Negative  Negative   09/21/2019 12:17 PM Negative  Negative     Hepatitis B Lab Results  Component Value Date   HEPBSAB REACTIVE (A) 08/03/2021   HEPBSAG NON-REACTIVE 08/03/2021   HEPBCAB NON-REACTIVE 08/03/2021   Hepatitis C Lab Results  Component Value Date   HEPCAB NON-REACTIVE 08/03/2021   Hepatitis A Lab Results  Component Value Date   HAV NON-REACTIVE 08/03/2021   Lipids: Lab Results  Component Value Date   CHOL  138 08/03/2021   TRIG 49 08/03/2021   HDL 56 08/03/2021   CHOLHDL 2.5 08/03/2021   LDLCALC 68 08/03/2021    Current HIV Regimen: Dovato   Assessment: Ethan Goodwin presents today for management of HIV. Referred to [facility] pharmacy due to multiple provider visit no-shows. [name] reports ***. He is currently prescribed Dovato . Review of fill history shows 92% days covered in the last 180 days. Will update labs, and have patient follow up with Dr. Zelda Hickman.  Consider switch to [employer]...  Plan: ***  Estela Held, PharmD PGY-2 Infectious Diseases Pharmacy Resident [facility] 03/02/2024 2:21 PM

## 2024-03-03 ENCOUNTER — Ambulatory Visit: Admitting: Pharmacist

## 2024-03-03 DIAGNOSIS — B20 Human immunodeficiency virus [HIV] disease: Secondary | ICD-10-CM

## 2024-03-03 DIAGNOSIS — Z113 Encounter for screening for infections with a predominantly sexual mode of transmission: Secondary | ICD-10-CM

## 2024-03-15 ENCOUNTER — Encounter (HOSPITAL_COMMUNITY): Payer: Self-pay

## 2024-03-15 ENCOUNTER — Emergency Department (HOSPITAL_COMMUNITY)
Admission: EM | Admit: 2024-03-15 | Discharge: 2024-03-16 | Discharge status: Against Medical Advice | Attending: Emergency Medicine | Admitting: Emergency Medicine

## 2024-03-15 ENCOUNTER — Emergency Department (HOSPITAL_COMMUNITY)

## 2024-03-15 ENCOUNTER — Other Ambulatory Visit: Payer: Self-pay

## 2024-03-15 DIAGNOSIS — L02414 Cutaneous abscess of left upper limb: Secondary | ICD-10-CM | POA: Diagnosis present

## 2024-03-15 DIAGNOSIS — Z21 Asymptomatic human immunodeficiency virus [HIV] infection status: Secondary | ICD-10-CM | POA: Diagnosis not present

## 2024-03-15 DIAGNOSIS — Z5321 Procedure and treatment not carried out due to patient leaving prior to being seen by health care provider: Secondary | ICD-10-CM | POA: Diagnosis not present

## 2024-03-15 NOTE — ED Triage Notes (Signed)
 Pt to ED c/o abscess to left wrist that started yesterday.Reports drainage, swelling noted.

## 2024-03-15 NOTE — ED Notes (Signed)
 Pt refused labs after being stuck once.

## 2024-03-15 NOTE — ED Provider Triage Note (Signed)
 Emergency Medicine Provider Triage Evaluation Note  Ethan Goodwin , a 33 y.o. male  was evaluated in triage.  Pt complains of cyst to left anterior wrist.  History of HIV  Review of Systems  Positive: Swelling, warmth, erythema Negative: Fever, chills, nausea, vomiting  Physical Exam  BP 127/84 (BP Location: Right Arm)   Pulse (!) 109   Temp 98.1 F (36.7 C)   Resp 20   Ht 5' 7 (1.702 m)   Wt 64.4 kg   SpO2 97%   BMI 22.24 kg/m  Gen:   Awake, no distress   Resp:  Normal effort  MSK:   Moves extremities without difficulty  Other:  Large approximately 2 cm in diameter boil like structure to the patient's anterior left wrist with diffuse swelling in the left forearm.  Patient neurovascularly intact.  +2 radial pulses.  Medical Decision Making  Medically screening exam initiated at 8:31 PM.  Appropriate orders placed.  Ethan Goodwin was informed that the remainder of the evaluation will be completed by another provider, this initial triage assessment does not replace that evaluation, and the importance of remaining in the ED until their evaluation is complete.  Labs and imaging ordered   Ethan Goodwin 03/15/24 2033

## 2024-03-16 NOTE — ED Notes (Signed)
Pt  Called multiple times, no response.

## 2024-03-17 ENCOUNTER — Other Ambulatory Visit: Payer: Self-pay

## 2024-03-17 ENCOUNTER — Encounter (HOSPITAL_COMMUNITY): Payer: Self-pay

## 2024-03-17 ENCOUNTER — Emergency Department (HOSPITAL_COMMUNITY)
Admission: EM | Admit: 2024-03-17 | Discharge: 2024-03-17 | Discharge status: Against Medical Advice | Attending: Emergency Medicine | Admitting: Emergency Medicine

## 2024-03-17 DIAGNOSIS — L0201 Cutaneous abscess of face: Secondary | ICD-10-CM | POA: Insufficient documentation

## 2024-03-17 DIAGNOSIS — Z5321 Procedure and treatment not carried out due to patient leaving prior to being seen by health care provider: Secondary | ICD-10-CM | POA: Insufficient documentation

## 2024-03-17 NOTE — ED Provider Notes (Signed)
 Patient left without being seen  No diagnosis found.    Ethan Goodwin 03/17/24 1845    Lind Repine, MD 03/18/24 1606

## 2024-03-17 NOTE — ED Triage Notes (Signed)
 Patient reports having a pimple on his wrist that grew a few night ago. He pushed what he believes was puss out of it. Today he presents because the area refilled. Yesterday his arm was swollen, but today he believes the selling has decreased. He also requests testing for STI.

## 2024-03-19 ENCOUNTER — Ambulatory Visit (HOSPITAL_COMMUNITY): Admission: EM | Admit: 2024-03-19 | Discharge: 2024-03-19 | Disposition: A | Discharge status: Home / Self Care

## 2024-03-19 DIAGNOSIS — F1510 Other stimulant abuse, uncomplicated: Secondary | ICD-10-CM

## 2024-03-19 NOTE — Progress Notes (Signed)
   03/19/24 2057  [facility] Triage Screening (Walk-ins at [facility] only)  How Did You Hear About Us ? Self  What Is the Reason for Your Visit/Call Today? Pt presents to [facility] as a voluntary walk-in, unaccompanied due to [facility] and requesting substance abuse treatment. Pt reports using Meth (1/2 gram) daily since his release from jail in February. Pt is experiencing homelessness at this time. Pt reports diagnosis of MDD. Pt reports being prescribed medications while he was incarcerated (Risperdal and Prozac), but has not had those medications since his release. Pt endorses AH yesterday and believed that he was hearing random conversations between two people when he was alone. Pt currently denies SI,HI,AVH.  How Long Has This Been Causing You Problems? 1-6 months  Have You Recently Had Any Thoughts About Hurting Yourself? No  Are You Planning to Commit Suicide/Harm Yourself At This time? No  Have you Recently Had Thoughts About Hurting Someone Marigene Shoulder? No  Are You Planning To Harm Someone At This Time? No  Physical Abuse Denies  Verbal Abuse Denies  Sexual Abuse Denies  Exploitation of patient/patient's resources Denies  Self-Neglect Yes, present (Comment)  Are you currently experiencing any auditory, visual or other hallucinations? No (denies at this moment, but reports AH on yesterday)  Have You Used Any Alcohol or Drugs in the Past 24 Hours? Yes  What Did You Use and How Much? Meth (1/2 gram)  Do you have any current medical co-morbidities that require immediate attention? No  Clinician description of patient physical appearance/behavior: Calm, cooperative, oriented  What Do You Feel Would Help You the Most Today? Treatment for Depression or other mood problem;Alcohol or Drug Use Treatment;Medication(s)  If access to [facility]gent [facility] was not available, would you have sought care in the Emergency Department? Yes  Determination of Need Urgent (48 hours)  Options For Referral Other: Comment;Chemical Dependency  Intensive Outpatient Therapy (CDIOP);Facility-Based Crisis;Medication Management;Outpatient Therapy  Determination of Need filed? Yes

## 2024-03-19 NOTE — Discharge Instructions (Signed)

## 2024-03-19 NOTE — ED Provider Notes (Signed)
 Behavioral Health Urgent Care Medical Screening Exam  Patient Name: Ethan Goodwin MRN: [id number] Date of Evaluation: 03/19/24 Chief Complaint:  I want to detox from meth Diagnosis:  Final diagnoses:  Methamphetamine abuse (HCC)    History of Present illness: Ethan Goodwin is a 33 y.o. male with a history of methamphetamine abuse. patient presented to [facility] as a walk in unaccompanied by with complaints of wanting detox methamphetamines.   [name], 33 y.o., male patient seen face to face by this provider chart reviewed on 03/19/24.  On evaluation [name] reports that he started using methamphetamines about 1 and half years ago due to being homeless.  Patient states that he most recently was in jail for about 8 months do not was released in February 2025 and at that time he relapsed and using methamphetamine.  Patient states that he is on probation and he does not want to go back to jail.  Patient denies any other illicit substance use or alcohol use. Patient reports that he has had some auditory hallucination but only in context of substance use. Patient reports that he had an intake at [facility] and he has swelling on left wrist that appears to be boil like. Patient was told by [facility] that he needed to have a note from the doctor stating he is cleared to be an inpatient before they will admit him to their detox facility. Patient presented to the [facility] -ED on 03/15/24 and [facility]-ED 03/17/24 and both times patient left before being seen. Patient presented stated that since he had completed the intake process at [facility] he and just needed a letter clearing him medically due to swelling on his wrist. Patient was offered to detox at [facility] but preferred services at [facility] and presented to [facility] wanting a letter.   During evaluation [name] is sitting in no acute distress.  He is alert, oriented x 4, calm, cooperative and attentive.  His mood is euthymic with congruent affect.  He  has normal speech, and behavior.  Objectively there is no evidence of psychosis/mania or delusional thinking.  Patient is able to converse coherently, goal directed thoughts, no distractibility, or pre-occupation. He/She also denies suicidal/self-harm/homicidal ideation, psychosis, and paranoia.  Patient answered question appropriately.     Patient discharged per his request and stated that he would follow up with [facility] for inpatient treatment for methamphetamine use.   Flowsheet Row ED from 03/19/2024 in [facility] ED from 03/17/2024 in [facility] Emergency Department at [facility] ED from 03/15/2024 in [facility] Emergency Department at [facility]  C-SSRS RISK CATEGORY No Risk No Risk No Risk    Psychiatric Specialty Exam  Presentation  General Appearance:Casual  Eye Contact:Good  Speech:Clear and Coherent  Speech Volume:Normal  Handedness:Right   Mood and Affect  Mood: Euthymic  Affect: Appropriate   Thought Process  Thought Processes: Coherent; Linear  Descriptions of Associations:Intact  Orientation:Full (Time, Place and Person)  Thought Content:WDL    Hallucinations:None  Ideas of Reference:None  Suicidal Thoughts:No  Homicidal Thoughts:No   Sensorium  Memory: Immediate Good; Recent Good; Remote Good  Judgment: Fair  Insight: Fair   Art therapist  Concentration: Good  Attention Span: Good  Recall: Good  Fund of Knowledge: Good  Language: Good   Psychomotor Activity  Psychomotor Activity: Normal   Assets  Assets: Communication Skills; Desire for Improvement; Resilience; Physical Health   Sleep  Sleep: Fair  Number of hours:  5  Physical Exam: Physical Exam HENT:     Head: Normocephalic.     Nose: Nose normal.   Eyes:     Pupils: Pupils are equal, round, and reactive to light.    Cardiovascular:     Rate and Rhythm: Normal rate.  Pulmonary:      Effort: Pulmonary effort is normal.  Abdominal:     General: Abdomen is flat.   Musculoskeletal:        General: Normal range of motion.     Cervical back: Normal range of motion.   Skin:    General: Skin is warm.   Neurological:     Mental Status: He is alert and oriented to person, place, and time.   Psychiatric:        Attention and Perception: Attention normal.        Mood and Affect: Mood normal.        Speech: Speech normal.        Behavior: Behavior is cooperative.        Thought Content: Thought content is not paranoid or delusional. Thought content does not include homicidal or suicidal ideation. Thought content does not include homicidal or suicidal plan.        Cognition and Memory: Cognition normal.        Judgment: Judgment is impulsive.    Review of Systems  Constitutional: Negative.   HENT: Negative.    Eyes: Negative.   Respiratory: Negative.    Cardiovascular: Negative.   Gastrointestinal: Negative.   Genitourinary: Negative.   Musculoskeletal: Negative.   Skin: Negative.   Neurological: Negative.   Endo/Heme/Allergies: Negative.   Psychiatric/Behavioral:  Positive for substance abuse.    Blood pressure 116/73, pulse (!) 102, temperature 97.8 F (36.6 C), temperature source Oral, resp. rate 18, SpO2 98%. There is no height or weight on file to calculate BMI.  Musculoskeletal: Strength & Muscle Tone: within normal limits Gait & Station: normal Patient leans: N/A   [facility] MSE Discharge Disposition for Follow up and Recommendations: Based on my evaluation the patient does not appear to have an emergency medical condition and can be discharged with resources and follow up care in outpatient services for Medication Management and Individual Therapy   [name], NP 03/19/2024, 10:40 PM

## 2024-03-20 NOTE — BH Assessment (Addendum)
 Comprehensive Clinical Assessment (CCA) Note   03/20/2024 Ethan Goodwin [id number]  Disposition: Per Roxianne Ivanoff, NP, pt is psych cleared. Patient discharged per his request and stated that he would follow up with [facility] for inpatient treatment for methamphetamine use.   The patient demonstrates the following risk factors for suicide: Chronic risk factors for suicide include: substance use disorder. Acute risk factors for suicide include: unemployment. Protective factors for this patient include: hope for the future. Considering these factors, the overall suicide risk at this point appears to be low. Patient is appropriate for outpatient follow up.   Pt presents to [facility] as a voluntary walk-in, unaccompanied due to [facility] and requesting substance abuse treatment. Pt reports using Meth (1/2 gram) daily since his release from jail in February. Pt is experiencing homelessness at this time. Pt reports diagnosis of MDD. Pt reports being prescribed medications while he was incarcerated (Risperdal and Prozac), but has not had those medications since his release. Pt endorses AH yesterday and believed that he was hearing random conversations between two people when he was alone. Pt currently denies SI,HI,AVH.    Upon evaluation with this clinician, the patient is alert, oriented x 3, and cooperative. Speech is clear, coherent and logical. Pt appears casual. Eye contact is fair. Mood is normal; affect is congruent with mood. The thought process is logical and thought content is coherent. Pt denies SI/HI/AVH. There is no indication that the patient is responding to internal stimuli. No delusions elicited during this assessment.     Chief Complaint:  Chief Complaint  Patient presents with   Addiction Problem   Hallucinations   Visit Diagnosis:  Methamphetamine abuse (HCC)       CCA Screening, Triage and Referral (STR)  Patient Reported Information How did you hear about us ? Self  What Is the Reason  for Your Visit/Call Today? Pt presents to [facility] as a voluntary walk-in, unaccompanied due to [facility] and requesting substance abuse treatment. Pt reports using Meth (1/2 gram) daily since his release from jail in February. Pt is experiencing homelessness at this time. Pt reports diagnosis of MDD. Pt reports being prescribed medications while he was incarcerated (Risperdal and Prozac), but has not had those medications since his release. Pt endorses AH yesterday and believed that he was hearing random conversations between two people when he was alone. Pt currently denies SI,HI,AVH.  How Long Has This Been Causing You Problems? 1-6 months  What Do You Feel Would Help You the Most Today? Treatment for Depression or other mood problem; Alcohol or Drug Use Treatment; Medication(s)   Have You Recently Had Any Thoughts About Hurting Yourself? No  Are You Planning to Commit Suicide/Harm Yourself At This time? No   Flowsheet Row ED from 03/19/2024 in [facility] ED from 03/17/2024 in [facility] Department at [facility] ED from 03/15/2024 in [facility] Emergency Department at [facility]  C-SSRS RISK CATEGORY No Risk No Risk No Risk    Have you Recently Had Thoughts About Hurting Someone [place]? No  Are You Planning to Harm Someone at This Time? No  Explanation: Denies HI   Have You Used Any Alcohol or Drugs in the Past 24 Hours? Yes  How Long Ago Did You Use Drugs or Alcohol? N/a What Did You Use and How Much? Meth (1/2 gram)   Do You Currently Have a Therapist/Psychiatrist? No  Name of Therapist/Psychiatrist:  n/a  Have You Been Recently Discharged From Any Office Practice  or Programs? No  Explanation of Discharge From Practice/Program: n/a    CCA Screening Triage Referral Assessment Type of Contact: Face-to-Face  Telemedicine Service Delivery:   Is this Initial or Reassessment?   Date Telepsych consult ordered in [facility]:    Time  Telepsych consult ordered in [facility]:    Location of Assessment: [facility] Assessment Services  Provider Location: [facility] Assessment Services   Collateral Involvement: None   Does Patient Have a Automotive engineer Guardian? No  Legal Guardian Contact Information: n/a  Copy of Legal Guardianship Form: -- (n/a)  Legal Guardian Notified of Arrival: -- (n/a)  Legal Guardian Notified of Pending Discharge: -- (n/a)  If Minor and Not Living with Parent(s), Who has Custody? n/a  Is CPS involved or ever been involved? Never  Is APS involved or ever been involved? Never   Patient Determined To Be At Risk for Harm To Self or Others Based on Review of Patient Reported Information or Presenting Complaint? No  Method: No Plan  Availability of Means: n/a Intent: Vague intent or NA  Notification Required: No need or identified person  Additional Information for Danger to Others Potential: -- (n/a)  Additional Comments for Danger to Others Potential: n/a  Are There Guns or Other Weapons in Your Home? No  Types of Guns/Weapons: Denies access  Are These Weapons Safely Secured?                            No  Who Could Verify You Are Able To Have These Secured: Denies access  Do You Have any Outstanding Charges, Pending Court Dates, Parole/Probation? Pt reports being on probation  Contacted To Inform of Risk of Harm To Self or Others: Other: Comment (n/a)    Does Patient Present under Involuntary Commitment? No    [place] of Residence: [place]   Patient Currently Receiving the Following Services: Not Receiving Services   Determination of Need: Urgent (48 hours)   Options For Referral: Other: Comment; Chemical Dependency Intensive Outpatient Therapy (CDIOP); Facility-Based Crisis; Medication Management; Outpatient Therapy     CCA Biopsychosocial Patient Reported Schizophrenia/Schizoaffective Diagnosis in Past: No   Strengths: willing to seek treatment   Mental  Health Symptoms Depression:  None   Duration of Depressive symptoms:    Mania:  None   Anxiety:   None   Psychosis:  Hallucinations (Pt reports hearing voice  only when he is high on METH)   Duration of Psychotic symptoms:    Trauma:  None   Obsessions:  None   Compulsions:  None   Inattention:  None   Hyperactivity/Impulsivity:  None   Oppositional/Defiant Behaviors:  None   Emotional Irregularity:  None   Other Mood/Personality Symptoms:  n/a    Mental Status Exam Appearance and self-care  Stature:  Average   Weight:  Thin   Clothing:  Casual   Grooming:  Normal   Cosmetic use:  None   Posture/gait:  Normal   Motor activity:  Not Remarkable   Sensorium  Attention:  Normal   Concentration:  Normal   Orientation:  X5   Recall/memory:  Normal   Affect and Mood  Affect:  Appropriate   Mood:  Euthymic   Relating  Eye contact:  Normal   Facial expression:  Responsive   Attitude toward examiner:  Cooperative   Thought and Language  Speech flow: Clear and Coherent   Thought content:  Appropriate to Mood and Circumstances  Preoccupation:  None   Hallucinations:  None   Organization:  Coherent   [employer] of Knowledge:  Good   Intelligence:  Average   Abstraction:  Abstract   Judgement:  Impaired   Reality Testing:  Realistic   Insight:  Good   Decision Making:  Impulsive   Social Functioning  Social Maturity:  Impulsive   Social Judgement:  Naive   Stress  Stressors:  Housing; Surveyor, quantity; Transitions   Coping Ability:  Normal   Skill Deficits:  Decision making   Supports:  Support needed     Religion: Religion/Spirituality Are You A Religious Person?: No How Might This Affect Treatment?: n/a  Leisure/Recreation: Leisure / Recreation Do You Have Hobbies?: No  Exercise/Diet: Exercise/Diet Do You Exercise?: No Have You Gained or Lost A Significant Amount of Weight in the Past Six Months?:  No Do You Follow a Special Diet?: No Do You Have Any Trouble Sleeping?: No   CCA Employment/Education Employment/Work Situation: Employment / Work Situation Employment Situation: Unemployed Patient's Job has Been Impacted by Current Illness: No Has Patient ever Been in Equities trader?: No  Education: Education Is Patient Currently Attending School?: No Last Grade Completed: 12 Did You Product manager?: No Did You Have An Individualized Education Program (IIEP): No Did You Have Any Difficulty At [employer]?: No Patient's Education Has Been Impacted by Current Illness: No   CCA Family/Childhood History Family and Relationship History: Family history Marital status: Single Does patient have children?: No  Childhood History:  Childhood History By whom was/is the patient raised?: Mother Did patient suffer any verbal/emotional/physical/sexual abuse as a child?: No Did patient suffer from severe childhood neglect?: No Has patient ever been sexually abused/assaulted/raped as an adolescent or adult?: No Was the patient ever a victim of a crime or a disaster?: No Witnessed domestic violence?: No Has patient been affected by domestic violence as an adult?: No       CCA Substance Use Alcohol/Drug Use: Alcohol / Drug Use Pain Medications: SEE MAR Prescriptions: SEE MAR Over the Counter: SEE MAR History of alcohol / drug use?: Yes Longest period of sobriety (when/how long): unknown Negative Consequences of Use: Financial, Personal relationships, Work / Programmer, multimedia Withdrawal Symptoms: None Substance #1 Name of Substance 1: METH 1 - Age of First Use: 32 1 - Amount (size/oz): 1/2 gram 1 - Frequency: daily 1 - Last Use / Amount: today                       ASAM's:  Six Dimensions of Multidimensional Assessment  Dimension 1:  Acute Intoxication and/or Withdrawal Potential:      Dimension 2:  Biomedical Conditions and Complications:      Dimension 3:  Emotional,  Behavioral, or Cognitive Conditions and Complications:     Dimension 4:  Readiness to Change:     Dimension 5:  Relapse, Continued use, or Continued Problem Potential:     Dimension 6:  Recovery/Living Environment:     ASAM Severity Score:    ASAM Recommended Level of Treatment: ASAM Recommended Level of Treatment: Level II Intensive Outpatient Treatment   Substance use Disorder (SUD) Substance Use Disorder (SUD)  Checklist Symptoms of Substance Use: Continued use despite having a persistent/recurrent physical/psychological problem caused/exacerbated by use, Continued use despite persistent or recurrent social, interpersonal problems, caused or exacerbated by use  Recommendations for Services/Supports/Treatments: Recommendations for Services/Supports/Treatments Recommendations For Services/Supports/Treatments: Facility Based Crisis, Individual Therapy  Disposition Recommendation per psychiatric provider: There  are no psychiatric contraindications to discharge at this time   DSM5 Diagnoses: Patient Active Problem List   Diagnosis Date Noted   Abscess of forearm, right 03/29/2021   Cellulitis of right hand 04/10/2020   Positive blood cultures 04/10/2020   IVDU (intravenous drug user)    HIV disease (HCC) 10/20/2019   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

## 2024-03-24 ENCOUNTER — Other Ambulatory Visit (HOSPITAL_COMMUNITY)
Admission: EM | Admit: 2024-03-24 | Discharge: 2024-03-27 | Disposition: A | Discharge status: Home / Self Care | Attending: Psychiatry | Admitting: Psychiatry

## 2024-03-24 DIAGNOSIS — F1510 Other stimulant abuse, uncomplicated: Secondary | ICD-10-CM | POA: Insufficient documentation

## 2024-03-24 DIAGNOSIS — F333 Major depressive disorder, recurrent, severe with psychotic symptoms: Secondary | ICD-10-CM | POA: Insufficient documentation

## 2024-03-24 DIAGNOSIS — Z5900 Homelessness unspecified: Secondary | ICD-10-CM | POA: Diagnosis not present

## 2024-03-24 DIAGNOSIS — F1994 Other psychoactive substance use, unspecified with psychoactive substance-induced mood disorder: Secondary | ICD-10-CM | POA: Diagnosis present

## 2024-03-24 DIAGNOSIS — F411 Generalized anxiety disorder: Secondary | ICD-10-CM | POA: Insufficient documentation

## 2024-03-24 DIAGNOSIS — Z8659 Personal history of other mental and behavioral disorders: Secondary | ICD-10-CM

## 2024-03-24 LAB — CBC WITH DIFFERENTIAL/PLATELET
Abs Immature Granulocytes: 0.06 10*3/uL (ref 0.00–0.07)
Basophils Absolute: 0 10*3/uL (ref 0.0–0.1)
Basophils Relative: 0 %
Eosinophils Absolute: 0.1 10*3/uL (ref 0.0–0.5)
Eosinophils Relative: 1 %
HCT: 41.9 % (ref 39.0–52.0)
Hemoglobin: 13.8 g/dL (ref 13.0–17.0)
Immature Granulocytes: 1 %
Lymphocytes Relative: 33 %
Lymphs Abs: 2.1 10*3/uL (ref 0.7–4.0)
MCH: 31.6 pg (ref 26.0–34.0)
MCHC: 32.9 g/dL (ref 30.0–36.0)
MCV: 95.9 fL (ref 80.0–100.0)
Monocytes Absolute: 0.4 10*3/uL (ref 0.1–1.0)
Monocytes Relative: 7 %
Neutro Abs: 3.6 10*3/uL (ref 1.7–7.7)
Neutrophils Relative %: 58 %
Platelets: 201 10*3/uL (ref 150–400)
RBC: 4.37 MIL/uL (ref 4.22–5.81)
RDW: 13.1 % (ref 11.5–15.5)
WBC: 6.3 10*3/uL (ref 4.0–10.5)
nRBC: 0 % (ref 0.0–0.2)

## 2024-03-24 LAB — ETHANOL: Alcohol, Ethyl (B): <15 mg/dL (ref <15)

## 2024-03-24 LAB — URINALYSIS, ROUTINE W REFLEX MICROSCOPIC
Bilirubin Urine: NEGATIVE
Glucose, UA: NEGATIVE mg/dL
Hgb urine dipstick: NEGATIVE
Ketones, ur: NEGATIVE mg/dL
Leukocytes,Ua: NEGATIVE
Nitrite: NEGATIVE
Protein, ur: NEGATIVE mg/dL
Specific Gravity, Urine: 1.03 (ref 1.005–1.030)
pH: 5 (ref 5.0–8.0)

## 2024-03-24 LAB — LIPID PANEL
Cholesterol: 127 mg/dL (ref 0–200)
HDL: 54 mg/dL (ref >40)
LDL Cholesterol: 63 mg/dL (ref 0–99)
Total CHOL/HDL Ratio: 2.4 ratio
Triglycerides: 49 mg/dL (ref <150)
VLDL: 10 mg/dL (ref 0–40)

## 2024-03-24 LAB — HEMOGLOBIN A1C
Hgb A1c MFr Bld: 5.3 % (ref 4.8–5.6)
Mean Plasma Glucose: 105.41 mg/dL

## 2024-03-24 LAB — COMPREHENSIVE METABOLIC PANEL WITH GFR
ALT: 19 U/L (ref 0–44)
AST: 23 U/L (ref 15–41)
Albumin: 3.7 g/dL (ref 3.5–5.0)
Alkaline Phosphatase: 87 U/L (ref 38–126)
Anion gap: 9 (ref 5–15)
BUN: 16 mg/dL (ref 6–20)
CO2: 23 mmol/L (ref 22–32)
Calcium: 9.3 mg/dL (ref 8.9–10.3)
Chloride: 107 mmol/L (ref 98–111)
Creatinine, Ser: 0.98 mg/dL (ref 0.61–1.24)
GFR, Estimated: >60 mL/min (ref >60)
Glucose, Bld: 100 mg/dL — ABNORMAL HIGH (ref 70–99)
Potassium: 3.8 mmol/L (ref 3.5–5.1)
Sodium: 139 mmol/L (ref 135–145)
Total Bilirubin: 1.4 mg/dL — ABNORMAL HIGH (ref 0.0–1.2)
Total Protein: 7.9 g/dL (ref 6.5–8.1)

## 2024-03-24 LAB — POCT URINE DRUG SCREEN - MANUAL ENTRY (I-SCREEN)
POC Amphetamine UR: POSITIVE — AB
POC Buprenorphine (BUP): NOT DETECTED
POC Cocaine UR: NOT DETECTED
POC Marijuana UR: NOT DETECTED
POC Methadone UR: NOT DETECTED
POC Methamphetamine UR: POSITIVE — AB
POC Morphine: NOT DETECTED
POC Oxazepam (BZO): NOT DETECTED
POC Oxycodone UR: NOT DETECTED
POC Secobarbital (BAR): NOT DETECTED

## 2024-03-24 LAB — TSH: TSH: 1.305 u[IU]/mL (ref 0.350–4.500)

## 2024-03-24 MED ORDER — HALOPERIDOL LACTATE 5 MG/ML IJ SOLN: 5.0000 mg | Freq: Three times a day (TID) | INTRAMUSCULAR | Status: DC | PRN

## 2024-03-24 MED ORDER — NICOTINE 21 MG/24HR TD PT24
21.0000 mg | MEDICATED_PATCH | Freq: Every day | TRANSDERMAL | Status: DC
  Administered 2024-03-24: 21 mg via TRANSDERMAL
  Filled 2024-03-24 (×2): qty 1

## 2024-03-24 MED ORDER — TRAZODONE HCL 50 MG PO TABS
50.0000 mg | ORAL_TABLET | Freq: Every evening | ORAL | Status: DC | PRN
  Administered 2024-03-25: 50 mg via ORAL
  Filled 2024-03-24: qty 1
  Filled 2024-03-24: qty 7
  Filled 2024-03-24: qty 1

## 2024-03-24 MED ORDER — DOLUTEGRAVIR-LAMIVUDINE 50-300 MG PO TABS
1.0000 | ORAL_TABLET | Freq: Every day | ORAL | Status: DC
  Administered 2024-03-24 – 2024-03-27 (×4): 1 via ORAL
  Filled 2024-03-24 (×5): qty 1

## 2024-03-24 MED ORDER — LORAZEPAM 2 MG/ML IJ SOLN: 2.0000 mg | Freq: Three times a day (TID) | INTRAMUSCULAR | Status: DC | PRN

## 2024-03-24 MED ORDER — RISPERIDONE 1 MG PO TABS
1.0000 mg | ORAL_TABLET | Freq: Two times a day (BID) | ORAL | Status: DC
  Administered 2024-03-24 – 2024-03-27 (×7): 1 mg via ORAL
  Filled 2024-03-24: qty 1
  Filled 2024-03-24: qty 14
  Filled 2024-03-24 (×6): qty 1

## 2024-03-24 MED ORDER — HALOPERIDOL LACTATE 5 MG/ML IJ SOLN: 10.0000 mg | Freq: Three times a day (TID) | INTRAMUSCULAR | Status: DC | PRN

## 2024-03-24 MED ORDER — HALOPERIDOL 5 MG PO TABS: 5.0000 mg | ORAL_TABLET | Freq: Three times a day (TID) | ORAL | Status: DC | PRN

## 2024-03-24 MED ORDER — ALUM & MAG HYDROXIDE-SIMETH 200-200-20 MG/5ML PO SUSP: 30.0000 mL | ORAL | Status: DC | PRN

## 2024-03-24 MED ORDER — FLUOXETINE HCL 10 MG PO CAPS
10.0000 mg | ORAL_CAPSULE | Freq: Every day | ORAL | Status: DC
  Administered 2024-03-24 – 2024-03-27 (×4): 10 mg via ORAL
  Filled 2024-03-24 (×3): qty 1
  Filled 2024-03-24: qty 7
  Filled 2024-03-24: qty 1

## 2024-03-24 MED ORDER — DIPHENHYDRAMINE HCL 50 MG PO CAPS: 50.0000 mg | ORAL_CAPSULE | Freq: Three times a day (TID) | ORAL | Status: DC | PRN

## 2024-03-24 MED ORDER — DIPHENHYDRAMINE HCL 50 MG/ML IJ SOLN: 50.0000 mg | Freq: Three times a day (TID) | INTRAMUSCULAR | Status: DC | PRN

## 2024-03-24 MED ORDER — HYDROXYZINE HCL 25 MG PO TABS: 25.0000 mg | ORAL_TABLET | Freq: Three times a day (TID) | ORAL | Status: DC | PRN

## 2024-03-24 MED ORDER — MAGNESIUM HYDROXIDE 400 MG/5ML PO SUSP: 30.0000 mL | Freq: Every day | ORAL | Status: DC | PRN

## 2024-03-24 MED ORDER — ACETAMINOPHEN 325 MG PO TABS: 650.0000 mg | ORAL_TABLET | Freq: Four times a day (QID) | ORAL | Status: DC | PRN

## 2024-03-24 NOTE — Group Note (Signed)
 Group Topic: Social Support  Group Date: 03/24/2024 Start Time: 1605 End Time: 1650 Facilitators: Celinda Suzen NOVAK, NT  Department: [facility]  Number of Participants: 5  Group Focus: relapse prevention and social skills Treatment Modality:  Psychoeducation Interventions utilized were support Purpose: increase insight and regain self-worth  Name: Ethan Goodwin Date of Birth: 11-30-1990  MR: [id number]    Level of Participation: PT did not attend group Quality of Participation: No participation Interactions with others: No interaction Mood/Affect: Sleep, drowsiness Triggers (if applicable): n/a Cognition: coherent/clear Progress: Other Response: MHT notified the PT a few times that it was time for group, PT slept  Plan: follow-up needed and patient will be encouraged to attend group  Patients Problems:  Patient Active Problem List   Diagnosis Date Noted   Substance induced mood disorder (HCC) 03/24/2024   Abscess of forearm, right 03/29/2021   Cellulitis of right hand 04/10/2020   Positive blood cultures 04/10/2020   IVDU (intravenous drug user)    HIV disease (HCC) 10/20/2019   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

## 2024-03-24 NOTE — ED Notes (Signed)
 Patient admitted to [facility]. Pt is AOX4, denies pain or discomfort att. Denies SI/HI and AVH. Patient reports that what brought him here was reasons with their probation officer. Patient reports meth use, last date of use was last night. Patient is calm and cooperative and upon skin assessment noticed generalized boils and scars. Pt reports some of those scars are from injection use. Patient reports pain 3/10 on L tip of pinky toe. Does have a dark colored abrasion on this area. Safety measures initiated.
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
## 2024-03-24 NOTE — ED Notes (Signed)
 Patient currently sleeping and resting in bed. Pt observed/assessed sleeping in bed. RR even and unlabored, appearing in no noted distress.
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
## 2024-03-24 NOTE — Progress Notes (Signed)
   03/24/24 0831  [facility] Triage Screening (Walk-ins at [facility] only)  How Did You Hear About Us ? Self  What Is the Reason for Your Visit/Call Today? Ethan Goodwin is a 33 year old male presenting to [facility] unaccompanied. Per chart review pt was here on 6/20 seeking shelter and detox off of meth. Pt appears to be coming here today with the same presenting concern. Pt states he was told to go to [facility] on the [facility]th, but then they appear to send him back to [facility] to stay. Pt reports that he is wanting to stay here for 3-5 days. Pt reports using 1/2 gram of meth, endorses occasional hallucinations, but not at this time. Pt appears to be looking for shelter at this time of triage. PT denies alcohol, Si, HI and Avh.  How Long Has This Been Causing You Problems? 1-6 months  Have You Recently Had Any Thoughts About Hurting Yourself? No  Are You Planning to Commit Suicide/Harm Yourself At This time? No  Have you Recently Had Thoughts About Hurting Someone Sherral? No  Are You Planning To Harm Someone At This Time? No  Physical Abuse Denies  Verbal Abuse Denies  Sexual Abuse Denies  Exploitation of patient/patient's resources Denies  Self-Neglect Yes, present (Comment)  Possible abuse reported to: Other (Comment)  Are you currently experiencing any auditory, visual or other hallucinations? No  Have You Used Any Alcohol or Drugs in the Past 24 Hours? Yes  What Did You Use and How Much? 1/2 gram  Do you have any current medical co-morbidities that require immediate attention? No  Clinician description of patient physical appearance/behavior: calm, cooperative  What Do You Feel Would Help You the Most Today? Housing Assistance  If access to [facility] was not available, would you have sought care in the Emergency Department? No  Determination of Need Routine (7 days)  Options For Referral Intensive Outpatient Therapy

## 2024-03-24 NOTE — ED Notes (Signed)
 Patient calm and cooperative. Patient denies any HI, SI, or AVH. RR even and unlabored, appearing in no noted distress.

## 2024-03-24 NOTE — Group Note (Signed)
 Group Topic: Overcoming Obstacles  Group Date: 03/24/2024 Start Time: 1715 End Time: 1735 Facilitators: Daved Tinnie HERO, RN; Wilfred Pinch, RN  Department: [facility]  Number of Participants: 5  Group Focus: coping skills Treatment Modality:  Psychoeducation Interventions utilized were exploration, group exercise, problem solving, and story telling Purpose: regain self-worth  Name: Markell Sciascia Date of Birth: September 28, 1991  MR: [id number]    Level of Participation: minimal Quality of Participation: attentive and cooperative Interactions with others: gave feedback Mood/Affect: appropriate Triggers (if applicable): n/a Cognition: logical Progress: Gaining insight Response: appropriate Plan: patient will be encouraged to continue to join groups and gain new information on how to positively cope with life circumstances.   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

## 2024-03-24 NOTE — Group Note (Signed)
 Group Topic: Change and Accountability  Group Date: 03/24/2024 Start Time: 2100 End Time: 2200 Facilitators: Carollynn Genre, NT  Department: [facility]  Number of Participants: 4  Group Focus: activities of daily living skills, chemical dependency education, chemical dependency issues, communication, and daily focus Treatment Modality:  Individual Therapy Interventions utilized were patient education, story telling, and support Purpose: enhance coping skills, express feelings, regain self-worth, and relapse prevention strategies  Name: Ethan Goodwin Date of Birth: 13-May-1991  MR: [id number]    Level of Participation: PT DID NOT ATTEND GROUP Quality of Participation: cooperative Interactions with others: gave feedback Mood/Affect: appropriate Triggers (if applicable): N A Cognition: coherent/clear Progress: Gaining insight Response:   Plan: patient will be encouraged to to go  for group.  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

## 2024-03-25 ENCOUNTER — Encounter (HOSPITAL_COMMUNITY): Payer: Self-pay | Admitting: Psychiatry

## 2024-03-25 MED ORDER — NICOTINE 21 MG/24HR TD PT24
21.0000 mg | MEDICATED_PATCH | Freq: Every day | TRANSDERMAL | Status: DC
  Administered 2024-03-25 – 2024-03-26 (×2): 21 mg via TRANSDERMAL
  Filled 2024-03-25 (×2): qty 1

## 2024-03-25 NOTE — ED Notes (Signed)
 Patient observed/assessed at bedside lying in bed asleep. Patient alert and oriented to self and location. Affect is flat. Patient denies pain and anxiety. He denies A/V/H. He denies having any thoughts/plan of self harm and harm towards others. Fluid and snack offered. Patient states that appetite has been good throughout the day. Verbalizes no further complaints at this time.

## 2024-03-25 NOTE — Group Note (Signed)
 Group Topic: Communication  Group Date: 03/25/2024 Start Time: 0930 End Time: 1030 Facilitators: Herold Lajuana NOVAK, RN  Department: [facility]  Number of Participants: 5  Group Focus: communication Treatment Modality:  Psychoeducation Interventions utilized were patient education Purpose: increase insight  Name: Ethan Goodwin Date of Birth: 11-Sep-1991  MR: [id number]      Patients Problems:  Level of Participation: active Quality of Participation: attentive Interactions with others: gave feedback Mood/Affect: appropriate Triggers (if applicable): none identified Cognition: coherent/clear Progress: Gaining insight Response: verbalized understanding of medication given Plan: patient will be encouraged to notify staff with any concerns or questions pertaining to his medication

## 2024-03-25 NOTE — ED Notes (Signed)
 Pt sleeping in no acute distress. RR even and unlabored. Environment secured. Will continue to monitor for safety.

## 2024-03-25 NOTE — ED Notes (Signed)
 Patient currently sleeping and resting in bed. Pt observed/assessed in room.

## 2024-03-25 NOTE — ED Notes (Signed)
 Patient is sleeping. Respirations equal and unlabored. No change in assessment or acuity. Routine safety checks conducted according to facility protocol.

## 2024-03-25 NOTE — ED Notes (Addendum)
 Patient A&Ox4. Denies intent to harm self/others when asked. Denies A/VH. Patient denies any physical complaints when asked. No acute distress noted. Support and encouragement provided. Routine safety checks conducted according to facility protocol. Encouraged patient to notify staff if thoughts of harm toward self or others arise. Patient verbalize understanding and agreement. Will continue to monitor for safety.

## 2024-03-25 NOTE — Group Note (Signed)
 Group Topic: Relapse and Recovery  Group Date: 03/25/2024 Start Time: 2000 End Time: 2100 Facilitators: Joan Plowman B  Department: [facility]  Number of Participants: 4  Group Focus: abuse issues, acceptance, chemical dependency education, chemical dependency issues, and coping skills Treatment Modality:  Individual Therapy Interventions utilized were leisure development, patient education, and problem solving Purpose: enhance coping skills, express feelings, increase insight, regain self-worth, relapse prevention strategies, and trigger / craving management  Name: Hoa Deriso Date of Birth: [birth year]  MR: [id number]    Level of Participation: active Quality of Participation: attentive and cooperative Interactions with others: gave feedback Mood/Affect: appropriate Triggers (if applicable): NA Cognition: coherent/clear Progress: Gaining insight Response: NA pt hasn't spoken much since being here but glad to see him attending the groups.  Plan: patient will be encouraged to keep going to groups.   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

## 2024-03-25 NOTE — ED Provider Notes (Signed)
 Behavioral Health Progress Note  Date and Time: 03/25/2024 1:00 PM Name: Ethan Goodwin MRN:  [id number]  Subjective:    Ethan Goodwin is a 33 y.o. male with a self reported history of methamphetamine abuse, anxiety and schizophrenia who presented to [facility] as a walk in unaccompanied by with complaints of wanting detox methamphetamines. He was transferred to the facility basis crisis unit for detox.  Patient was assessed in his room this morning.  Patient reports that his main goal is to detox from methamphetamines.  Patient reports that he feels okay and denies any withdrawal symptoms.  Patient previously attempted to go to [facility] and was told he needed medical clearance. Patient continues to be interested in treatment to help with substance sobriety.  Regarding his mood, patient denies any depressive or anxiety symptoms.  Rates both a 0 out of 10, with 10 being most severe.  He denies suicidal ideations, homicidal ideations or auditory visual hallucinations.  Patient disclosed a history of schizophrenia diagnosis that was given to him 8 months ago while he was incarcerated.  Patient reports that at that time he was sober from any substances, however was hearing voices speak to each other around him.  He reports that he last heard voices 3 days ago.  Denies any command auditory hallucinations, visual hallucinations and patient does not appear to be responding to internal stimuli upon my interview.  Patient reports that his symptoms are well controlled on the risperidone and fluoxetine. Patient reports that he has a sister.  Diagnosis:  Final diagnoses:  Methamphetamine abuse (HCC)  Generalized anxiety disorder    Total Time spent with patient: 20 minutes  Past Psychiatric History: Patient reports history of Schizophrenia, generalized anxiety, methamphetamine abuse. Denies current outpatient psychiatrist or therapy.  Past Medical History: HIV  Family History: None disclosed  Family  Psychiatric  History: None disclosed. Social History:  No developmental issues in school and graduated from high school. Currently homeless and unemployed. Currently on parole    Substance Use:  Tobacco use, 1/2 PPD  Methaphetamine Use, started 2024, 1/2 gram daily via IV  Patient attempted to detox at [facility] June 2025 but was told to go back to [facility]   Additional Social History:    Pain Medications: SEE MAR Prescriptions: SEE MAR Over the Counter: SEE MAR History of alcohol / drug use?: Yes Longest period of sobriety (when/how long): unknown Negative Consequences of Use: Financial, Personal relationships, Work / Programmer, multimedia Withdrawal Symptoms: None, Agitation, Aggressive/Assaultive Name of Substance 1: Meth 1 - Age of First Use: 32 1 - Amount (size/oz): 1/2 gram 1 - Frequency: daily 1 - Last Use / Amount: 03/23/24                  Sleep: Fair  Appetite:  Fair  Current Medications:  Current Facility-Administered Medications  Medication Dose Route Frequency Provider Last Rate Last Admin   acetaminophen  (TYLENOL ) tablet 650 mg  650 mg Oral Q6H PRN Nkwenti, Doris, NP       alum & mag hydroxide-simeth (MAALOX/MYLANTA) 200-200-20 MG/5ML suspension 30 mL  30 mL Oral Q4H PRN Nkwenti, Doris, NP       haloperidol (HALDOL) tablet 5 mg  5 mg Oral TID PRN Tex Drilling, NP       And   diphenhydrAMINE (BENADRYL) capsule 50 mg  50 mg Oral TID PRN Tex Drilling, NP       haloperidol lactate (HALDOL) injection 5 mg  5 mg Intramuscular TID PRN Tex Drilling, NP  And   diphenhydrAMINE (BENADRYL) injection 50 mg  50 mg Intramuscular TID PRN Tex Drilling, NP       And   LORazepam (ATIVAN) injection 2 mg  2 mg Intramuscular TID PRN Tex Drilling, NP       haloperidol lactate (HALDOL) injection 10 mg  10 mg Intramuscular TID PRN Tex Drilling, NP       And   diphenhydrAMINE (BENADRYL) injection 50 mg  50 mg Intramuscular TID PRN Tex Drilling, NP       And   LORazepam (ATIVAN)  injection 2 mg  2 mg Intramuscular TID PRN Tex Drilling, NP       dolutegravir -lamiVUDine  (DOVATO ) 50-300 MG per tablet 1 tablet  1 tablet Oral Daily Tex Drilling, NP   1 tablet at 03/25/24 1029   FLUoxetine (PROZAC) capsule 10 mg  10 mg Oral Daily Nkwenti, Doris, NP   10 mg at 03/25/24 1029   hydrOXYzine (ATARAX) tablet 25 mg  25 mg Oral TID PRN Tex Drilling, NP       magnesium hydroxide (MILK OF MAGNESIA) suspension 30 mL  30 mL Oral Daily PRN Tex Drilling, NP       [START ON 03/26/2024] nicotine  (NICODERM CQ  - dosed in mg/24 hours) patch 21 mg  21 mg Transdermal Q0600 Lenard Calin, MD   21 mg at 03/25/24 1030   risperiDONE (RISPERDAL) tablet 1 mg  1 mg Oral BID Nkwenti, Doris, NP   1 mg at 03/25/24 1029   traZODone (DESYREL) tablet 50 mg  50 mg Oral QHS PRN Tex Drilling, NP       Current Outpatient Medications  Medication Sig Dispense Refill   dolutegravir -lamiVUDine  (DOVATO ) 50-300 MG tablet Take 1 tablet by mouth daily. 30 tablet 1   doxycycline  (VIBRAMYCIN ) 100 MG capsule Take 1 capsule (100 mg total) by mouth 2 (two) times daily. (Patient not taking: Reported on 03/24/2024) 20 capsule 0    Labs  Lab Results:  Admission on 03/24/2024  Component Date Value Ref Range Status   WBC 03/24/2024 6.3  4.0 - 10.5 K/uL Final   RBC 03/24/2024 4.37  4.22 - 5.81 MIL/uL Final   Hemoglobin 03/24/2024 13.8  13.0 - 17.0 g/dL Final   HCT 93/74/7974 41.9  39.0 - 52.0 % Final   MCV 03/24/2024 95.9  80.0 - 100.0 fL Final   MCH 03/24/2024 31.6  26.0 - 34.0 pg Final   MCHC 03/24/2024 32.9  30.0 - 36.0 g/dL Final   RDW 93/74/7974 13.1  11.5 - 15.5 % Final   Platelets 03/24/2024 201  150 - 400 K/uL Final   nRBC 03/24/2024 0.0  0.0 - 0.2 % Final   Neutrophils Relative % 03/24/2024 58  % Final   Neutro Abs 03/24/2024 3.6  1.7 - 7.7 K/uL Final   Lymphocytes Relative 03/24/2024 33  % Final   Lymphs Abs 03/24/2024 2.1  0.7 - 4.0 K/uL Final   Monocytes Relative 03/24/2024 7  % Final   Monocytes  Absolute 03/24/2024 0.4  0.1 - 1.0 K/uL Final   Eosinophils Relative 03/24/2024 1  % Final   Eosinophils Absolute 03/24/2024 0.1  0.0 - 0.5 K/uL Final   Basophils Relative 03/24/2024 0  % Final   Basophils Absolute 03/24/2024 0.0  0.0 - 0.1 K/uL Final   Immature Granulocytes 03/24/2024 1  % Final   Abs Immature Granulocytes 03/24/2024 0.06  0.00 - 0.07 K/uL Final   Performed at [facility], [address], [place], [place]  [address]   Sodium 03/24/2024 139  135 - 145 mmol/L Final   Potassium 03/24/2024 3.8  3.5 - 5.1 mmol/L Final   Chloride 03/24/2024 107  98 - 111 mmol/L Final   CO2 03/24/2024 23  22 - 32 mmol/L Final   Glucose, Bld 03/24/2024 100 (H)  70 - 99 mg/dL Final   Glucose reference range applies only to samples taken after fasting for at least 8 hours.   BUN 03/24/2024 16  6 - 20 mg/dL Final   Creatinine, Ser 03/24/2024 0.98  0.61 - 1.24 mg/dL Final   Calcium 93/74/7974 9.3  8.9 - 10.3 mg/dL Final   Total Protein 93/74/7974 7.9  6.5 - 8.1 g/dL Final   Albumin 93/74/7974 3.7  3.5 - 5.0 g/dL Final   AST 93/74/7974 23  15 - 41 U/L Final   ALT 03/24/2024 19  0 - 44 U/L Final   Alkaline Phosphatase 03/24/2024 87  38 - 126 U/L Final   Total Bilirubin 03/24/2024 1.4 (H)  0.0 - 1.2 mg/dL Final   GFR, Estimated 03/24/2024 >60  >60 mL/min Final   Comment: (NOTE) Calculated using the CKD-EPI Creatinine Equation (2021)    Anion gap 03/24/2024 9  5 - 15 Final   Performed at [facility], [address], [place], [place] [address]   Hgb A1c MFr Bld 03/24/2024 5.3  4.8 - 5.6 % Final   Comment: (NOTE) Diagnosis of Diabetes The following HbA1c ranges recommended by the American Diabetes Association (ADA) may be used as an aid in the diagnosis of diabetes mellitus.  Hemoglobin             Suggested A1C NGSP%              Diagnosis  <5.7                   Non Diabetic  5.7-6.4                Pre-Diabetic  >6.4                   Diabetic  <7.0                    Glycemic control for                       adults with diabetes.     Mean Plasma Glucose 03/24/2024 105.41  mg/dL Final   Performed at [facility], [address] [place], [place] [address]   Alcohol, Ethyl (B) 03/24/2024 <15  <15 mg/dL Final   Comment: (NOTE) For medical purposes only. Performed at [facility], [address], [place], [place] [address]    Cholesterol 03/24/2024 127  0 - 200 mg/dL Final   Triglycerides 93/74/7974 49  <150 mg/dL Final   HDL 93/74/7974 54  >40 mg/dL Final   Total CHOL/HDL Ratio 03/24/2024 2.4  RATIO Final   VLDL 03/24/2024 10  0 - 40 mg/dL Final   LDL Cholesterol 03/24/2024 63  0 - 99 mg/dL Final   Comment:        Total Cholesterol/HDL:CHD Risk Coronary Heart Disease Risk Table                     Men   Women  1/2 Average Risk   3.4   3.3  Average Risk       5.0   4.4  2 X Average Risk   9.6   7.1  3 X Average Risk  23.4   11.0        Use the calculated Patient Ratio above and the CHD Risk Table to determine the patient's CHD Risk.        ATP III CLASSIFICATION (LDL):  <100     mg/dL   Optimal  899-870  mg/dL   Near or Above                    Optimal  130-159  mg/dL   Borderline  839-810  mg/dL   High  >809     mg/dL   Very High Performed at [facility], [address] [place], [place] [address]    TSH 03/24/2024 1.305  0.350 - 4.500 uIU/mL Final   Comment: Performed by a 3rd Generation assay with a functional sensitivity of <=0.01 uIU/mL. Performed at [facility], [address], [place], [place] [address]    Color, Urine 03/24/2024 AMBER (A)  YELLOW Final   BIOCHEMICALS MAY BE AFFECTED BY COLOR   APPearance 03/24/2024 CLEAR  CLEAR Final   Specific Gravity, Urine 03/24/2024 1.030  1.005 - 1.030 Final   pH 03/24/2024 5.0  5.0 - 8.0 Final   Glucose, UA 03/24/2024 NEGATIVE  NEGATIVE mg/dL Final   Hgb urine dipstick 03/24/2024 NEGATIVE  NEGATIVE Final   Bilirubin Urine 03/24/2024 NEGATIVE  NEGATIVE Final    Ketones, ur 03/24/2024 NEGATIVE  NEGATIVE mg/dL Final   Protein, ur 93/74/7974 NEGATIVE  NEGATIVE mg/dL Final   Nitrite 93/74/7974 NEGATIVE  NEGATIVE Final   Leukocytes,Ua 03/24/2024 NEGATIVE  NEGATIVE Final   Performed at [facility], [address], [place], [place] [address]   POC Amphetamine UR 03/24/2024 Positive (A)  NONE DETECTED (Cut Off Level 1000 ng/mL) Final   POC Secobarbital (BAR) 03/24/2024 None Detected  NONE DETECTED (Cut Off Level 300 ng/mL) Final   POC Buprenorphine (BUP) 03/24/2024 None Detected  NONE DETECTED (Cut Off Level 10 ng/mL) Final   POC Oxazepam (BZO) 03/24/2024 None Detected  NONE DETECTED (Cut Off Level 300 ng/mL) Final   POC Cocaine UR 03/24/2024 None Detected  NONE DETECTED (Cut Off Level 300 ng/mL) Final   POC Methamphetamine UR 03/24/2024 Positive (A)  NONE DETECTED (Cut Off Level 1000 ng/mL) Final   POC Morphine  03/24/2024 None Detected  NONE DETECTED (Cut Off Level 300 ng/mL) Final   POC Methadone UR 03/24/2024 None Detected  NONE DETECTED (Cut Off Level 300 ng/mL) Final   POC Oxycodone  UR 03/24/2024 None Detected  NONE DETECTED (Cut Off Level 100 ng/mL) Final   POC Marijuana UR 03/24/2024 None Detected  NONE DETECTED (Cut Off Level 50 ng/mL) Final    Blood Alcohol level:  Lab Results  Component Value Date   [facility] <15 03/24/2024    Metabolic Disorder Labs: Lab Results  Component Value Date   HGBA1C 5.3 03/24/2024   MPG 105.41 03/24/2024   No results found for: PROLACTIN Lab Results  Component Value Date   CHOL 127 03/24/2024   TRIG 49 03/24/2024   HDL 54 03/24/2024   CHOLHDL 2.4 03/24/2024   VLDL 10 03/24/2024   LDLCALC 63 03/24/2024   LDLCALC 68 08/03/2021    Therapeutic Lab Levels: No results found for: LITHIUM No results found for: VALPROATE No results found for: CBMZ  Physical Findings   PHQ2-9    Flowsheet Row ED from 03/24/2024 in [facility] Office Visit from 06/09/2023 in [facility] [place] Office Visit from 07/03/2021 in [facility] [place] Office Visit from 03/29/2021 in [facility] [place]  PHQ-2 Total Score 2 0 0 2  PHQ-9 Total Score 16 -- -- 4   Flowsheet Row ED from 03/24/2024 in [facility] ED from 03/19/2024 in [facility] ED from 03/17/2024 in [facility] Emergency Department at [facility]  C-SSRS RISK CATEGORY No Risk No Risk No Risk     Musculoskeletal  Strength & Muscle Tone: within normal limits Gait & Station: normal Patient leans: N/A  Psychiatric Specialty Exam  Presentation  General Appearance:  Appropriate for Environment; Casual  Eye Contact: Good  Speech: Clear and Coherent; Normal Rate  Speech Volume: Normal  Handedness: Right   Mood and Affect  Mood: Euthymic  Affect: Congruent; Appropriate   Thought Process  Thought Processes: Goal Directed; Linear; Coherent  Descriptions of Associations:Intact  Orientation:Full (Time, Place and Person)  Thought Content:Logical  Diagnosis of Schizophrenia or Schizoaffective disorder in past: Yes (self reported)  Duration of Psychotic Symptoms: Greater than six months (self reported)   Hallucinations:Hallucinations: None  Ideas of Reference:None  Suicidal Thoughts:Suicidal Thoughts: No  Homicidal Thoughts:Homicidal Thoughts: No   Sensorium  Memory: Immediate Fair  Judgment: Fair  Insight: Fair   Executive Functions  Concentration: Good  Attention Span: Good  Recall: Fair  Fund of Knowledge: Good  Language: Good   Psychomotor Activity  Psychomotor Activity: Psychomotor Activity: Normal   Assets  Assets: Communication Skills; Resilience; Social Support   Sleep  Sleep: Sleep: Fair  Estimated Sleeping Duration  (Last 24 Hours): 15.25-17.75 hours  Nutritional Assessment (For OBS and FBC admissions only) Has the patient had a weight loss or gain of 10 pounds or more in the last 3 months?: Yes Has the patient had a decrease in food intake/or appetite?: Yes Does the patient have dental problems?: No Does the patient have eating habits or behaviors that may be indicators of an eating disorder including binging or inducing vomiting?: No Has the patient recently lost weight without trying?: 1 Has the patient been eating poorly because of a decreased appetite?: 1 Malnutrition Screening Tool Score: 2    Physical Exam  Physical Exam Constitutional:      General: He is not in acute distress.    Appearance: Normal appearance. He is not ill-appearing, toxic-appearing or diaphoretic.   Eyes:     Conjunctiva/sclera: Conjunctivae normal.   Pulmonary:     Effort: Pulmonary effort is normal.   Musculoskeletal:        General: Normal range of motion.   Neurological:     Mental Status: He is alert and oriented to person, place, and time.    Review of Systems  Constitutional:  Negative for chills, fever and malaise/fatigue.  Respiratory:  Negative for cough.   Gastrointestinal:  Negative for constipation, nausea and vomiting.  Neurological:  Negative for headaches.  Psychiatric/Behavioral:  Positive for substance abuse. Negative for depression, hallucinations and suicidal ideas. The patient is not nervous/anxious and does not have insomnia.    Blood pressure 110/76, pulse 82, temperature 98.4 F (36.9 C), temperature source Oral, resp. rate 18, SpO2 94%. There is no height or weight on file to calculate  BMI.  Medical Decision Making   Alphonzo Devera is a 33 y.o. male with a self reported history of methamphetamine abuse, anxiety and schizophrenia who presented to [facility] as a walk in unaccompanied by with complaints of wanting detox methamphetamines. He was transferred to the facility basis crisis  unit for detox.   Upon my assessment, the patient continues to want to detox and consider resources for substance treatment. His mood/psychotic symptoms are well controlled on current home regimen. Patient will continue to stay on unit for monitoring for withdrawal symptoms and discuss dispo planning for treatment with social work.   Recommendations:   Refer to [facility] for detailed medications  - Continue Prozac 10 mg daily which pt states has been very helpful in the past for GAD - Continue Risperdal 1mg  BID which pt states was helpful in the past for psychosis/self disclosed schizophrenia diagnosis   - Continue Hydroxyzine 25 mg TID PRN for anxiety Continue Trazodone 50 mg nightly PRN for sleep - Abstinence from substances encouraged  - SW to look into options for treatment prior at discharge, appreciate assistance     Recommendations  Based on my evaluation the patient appears to have an emergency mental health condition for which I recommend the patient be transferred to an inpatient behavioral health unit for treatment and stabilization.   PATTI OLDEN, MD 03/25/2024 1:00 PM

## 2024-03-25 NOTE — Discharge Instructions (Addendum)
 Non-Emergent / Urgent   [facility] [address] SECOND FLOOR [place], [place] [address] [phone] OUTPATIENT Walk-in information: Please note, all walk-ins are first come & first serve, with limited number of availability.   Please note that to be eligible for services you must bring: ID or a piece of mail with [facility] address   Therapist for therapy:  Monday & Wednesdays: Please ARRIVE at 7:15 AM for registration Will START at 8:00 AM Every 1st & 2nd Friday of the month: Please ARRIVE at 10:15 AM for registration Will START at 1 PM - 5 PM   Psychiatrist for medication management: Monday - Friday:  Please ARRIVE at 7:15 AM for registration Will START at 8:00 AM   Regretfully, due to limited availability, please be aware that you may not been seen on the same day as walk-in. Please consider making an appoint or try again. Thank you for your patience and understanding. ________________________________________________________   [facility] URGENT CARE:  [address] FIRST FLOOR.  [place], [place] [address].  [phone]   Mobile Crisis Response Teams Listed by counties in vicinity of [facility] providers [facility]  [facility] [phone] [facility]  [facility] [phone] [facility]  [facility] [phone] [facility]  [facility] [phone] [place]                 * [facility] [phone]                * [employer] [phone] [facility]  [facility] [phone] [facility]  [phone] * [employer] [phone] ________________________________________________________  LCSW spoke with patient regarding plans at discharge. Patient aware of resources provided at the [facility] Center-Outpatient New Patient Assessment/Therapy Walk ins:. Instructions provided in AVS. Patient expressed appreciation for LCSW assistance with discharge planning. Patient reports feeling safe to discharge and reports having support from family when he/she returns home. No other needs were reported at this time. LCSW to sign off. Please inform if further LCSW needs arise prior to discharge.      Based on the information that you have provided and the presenting issues the following homeless shelter have been provided below.  In case of an urgent crisis, you may contact the Mobile Crisis Unit with [facility] at [phone].              Homeless Shelters in [facility] NIGHT SHELTER) [address] [place], [place] Phone: [phone]   [facility] has been providing emergency shelter to those in need of a permanent residence for over 35 years. The [employer] shelter plays an important role in our community.   There are many life events that can pull someone into a downward spiral towards poverty that is very difficult to get out of. Homelessness is a problem that can affect anyone of us . [employer] is a safe and comforting place to stay, especially if you have experienced the hardship of street life.   [employer] provides a single bed and bedding to 100 adult men and women. The shelter welcomes all who are in need of housing, no one in real need is turned away unless space is not available.   While staying at [facility], guests are offered more than just a bed for a night.  Hot meals are provided and every guest has access to case management services. Case managers provide assistance with finding housing, employment, or other services that will help them gain stability. Continuous stay is based on availability, capacity, and progress towards goals.   To contact the  front desk of [facility] please call  [phone] ext [phone] or [phone]. [phone].      [facility] AND CHILDREN) [address] [place], [place]  [address] [phone]       [facility] [address] [place], [place] [address] Phone: [phone]     [facility]  [address], [place], [place]  [address] [phone] Population served: Male veterans 18+ with substance abuse/mental health issues Eligibility: By referral only     [facility] (Women only) [address] [place]ora Solon [place], [place] [address] Phone: [phone] Population served: Single women 18+ without dependents Documents required:  Valid ID & Social Security Card       [facility] [employer] [employer] Address: [address] [place], [place] [address] Hours:   Opens 9?AM Mon Phone: [phone]  The [facility] providers a continuum of housing services to those experiencing homelessness. They provide transitional [employer] and permanent supportive housing ([employer]) to disabled veterans experiencing homelessness. There is a fast track Rapid Rehousing program couples housing stability services with temporary financial assistance to quickly house individuals and families experiencing homelessness.   [employer] [address], [place] [address] Phone: [phone]      [facility] [address] [place]wood [address]ner [place], [place] [address] Phone: [phone]  Offers food and emergency or transitional housing to men, women, or families in need. Clients participate in programs and workshops developed to promote self-sufficiency and personal development. Call or walk in. Applications are accepted Monday, Wednesday, and Friday by appointment only. Need photo ID and proof of income    [employer] [facility] [employer] [address] [place], [place]  [address] [phone] Population served: Men 18+, preference for disabled and/or veterans    [facility]  [address] [place], [place], [place] [address] [phone] Population served: Families with children.      Room at the [place] [facility] [employer]. [address], [place] [address] [phone] or [phone] Population served: Pregnant women with or without children       [facility] [address], [place], [place] [address] Check in at 6:00PM for placement at a local shelter) Phone: [phone]   [facility] Eligibility:  Must be drug and alcohol free for at least 14 days or more at the time of application. This program serves males.  Houses Engineer, civil (consulting), Economist who serve six-month terms.  Houses are financially self-supporting; members split house expenses, which average $90.00 to $130.00 per person per week.  Any Resident who relapses must be immediately expelled. Call:  [phone]   [facility] Address: [address] [place], [place] [address]  Phone#: [phone]    [facility] Address: [address] [place], [place] [address] Phone: [phone]  -The [facility] provides food, shelter and other programs and services to the homeless men of [place] through our [employer] program.  By offering safe shelter, three meals a day, clean clothing, Biblical counseling, financial  planning, vocational training, GED/education and employment assistance, we've helped mend the shattered lives of many homeless men since opening in 1974.  We have approximately 267 beds available, with a max of 312 beds including mats for emergency situations and currently house an average of 270 men a night.  Prospective Client Check-In Information Photo ID Required (State/ Out of State/ [facility]) - if photo ID is  not available, clients are required to have a printout of a police/sheriff's criminal history report. Help out with chores around the [facility]. No sex offender of any type (pending, charged, registered and/or any other sex related offenses) will be permitted to check in. Must be willing to abide by all rules, regulations, and policies established by the [employer]. The following will be provided - shelter, food, clothing, and biblical counseling. If you or someone you know is in need of assistance at our [facility] shelter in [place], [place], please call [phone]. [phone].  Women Shelter for [employer] hours are Monday-Friday only.    [facility]          -(Please Call) Phone: [phone]   Provides housing and specialized case management focused on housing stability.

## 2024-03-25 NOTE — ED Notes (Signed)
 Patient currently sleeping and resting in bed. RR even and unlabored, appearing in no noted distress.

## 2024-03-25 NOTE — Discharge Planning (Signed)
 LCSW met with patient to assess current mood, affect, physical state, and inquire about needs/goals while here in [facility] and after discharge. 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 Patient reported first use of meth was approximately one year ago. He stated that he was released from jail in February for 8 months incarcerated in county jail. He stated that he was living with his sister but is no longer able to and has been homeless but also stated he has a friend he can stay with also .  Patient stated that he uses the bus system for his transportation. He stated that he had not been compliant with his medications or follow up since released from jail but reported taking Risperdal and Prozac during incarceration.   Patient reports his/her current goal is to seek outpatient resources for substance use and is agreeable to come back to [facility] urgent care upstairs as a walk in for SAIOP. Patient did not want to consider residential. He does have [employer]. 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   Patient denies any prior history of outpatient or inpatient substance abuse treatment. Patient currently denies any SI/HI/AVH. Patient will be provided with shelter resources and instructions for walk in clinic upstairs for SAIOP and medication management on outpatient basis when deemed stable for DC. No other needs were reported at this time by patient.

## 2024-03-26 MED ORDER — TRAZODONE HCL 50 MG PO TABS: 50.0000 mg | ORAL_TABLET | Freq: Every evening | ORAL | 0 refills | Status: AC | PRN

## 2024-03-26 MED ORDER — RISPERIDONE 1 MG PO TABS: 1.0000 mg | ORAL_TABLET | Freq: Two times a day (BID) | ORAL | 0 refills | Status: AC

## 2024-03-26 MED ORDER — FLUOXETINE HCL 10 MG PO CAPS: 10.0000 mg | ORAL_CAPSULE | Freq: Every day | ORAL | 0 refills | Status: DC

## 2024-03-26 MED ORDER — NICOTINE 21 MG/24HR TD PT24: 21.0000 mg | MEDICATED_PATCH | Freq: Every day | TRANSDERMAL | 0 refills | Status: DC

## 2024-03-26 NOTE — Group Note (Signed)
 Group Topic: Recovery Basics  Group Date: 03/26/2024 Start Time: 1000 End Time: 1030 Facilitators: Sherleen Primmer, RN  Department: [facility]  Number of Participants: 2  Group Focus: coping skills Treatment Modality:  Psychoeducation Interventions utilized were exploration, problem solving, and reality testing Purpose: enhance coping skills  Name: Ethan Goodwin Date of Birth: 06-21-1991  MR: [id number]    Level of Participation: Pt did not attend group. 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

## 2024-03-26 NOTE — Group Note (Signed)
 Group Topic: Overcoming Obstacles  Group Date: 03/26/2024 Start Time: 0800 End Time: 0830 Facilitators: [place] , Arleen POUR, NT; Carollynn Genre, NT  Department: [facility]  Number of Participants: 7  Group Focus: goals/reality orientation, problem solving, and self-awareness Treatment Modality:  Cognitive Behavioral Therapy and Solution-Focused Therapy Interventions utilized were exploration, problem solving, and support Purpose: express feelings, improve communication skills, and increase insight  Name: Ethan Goodwin Date of Birth: 1991/09/22  MR: [id number]    Level of Participation: minimal Quality of Participation: quiet Interactions with others: gave feedback Mood/Affect: bored and positive Triggers (if applicable): N/A Cognition: logical Progress: Moderate Response: I've tried everything but it just doesn't seem to work. I would like to get better and get healthy. 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

## 2024-03-26 NOTE — Progress Notes (Signed)
 Pt stayed in bed most of the shift however woke up for meals. No distress noted or concerns voiced. Staff will monitor for pt's safety.

## 2024-03-26 NOTE — Progress Notes (Signed)
Pt is awake, alert and oriented X4. Pt did not voice any complaints of pain or discomfort. No signs of acute distress noted. Administered scheduled meds per order. Pt denies current SI/HI/AVH, plan or intent. Staff will monitor for pt's safety.

## 2024-03-26 NOTE — ED Notes (Signed)
 Pt was provided dinner.

## 2024-03-26 NOTE — ED Provider Notes (Incomplete)
 FBC/OBS ASAP Discharge Summary  Date and Time: 03/26/2024 10:18 AM  Name: Ethan Goodwin  MRN:  [id number]   Discharge Diagnoses:  Final diagnoses:  Methamphetamine abuse (HCC)  Generalized anxiety disorder  Self reported history of schizophrenia   Ethan Goodwin is a 33 y.o. male with a self reported history of methamphetamine abuse, anxiety and schizophrenia who presented to [facility] as a walk in unaccompanied by with complaints of wanting detox methamphetamines. He was transferred to the facility basis crisis unit for detox.   Subjective:  ****    Stay Summary:   During the patient's hospitalization, patient had extensive initial psychiatric evaluation, and follow-up psychiatric evaluations every day.  Psychiatric diagnoses provided upon initial assessment:  MDD (major depressive disorder), recurrent, severe, with psychosis (HCC)  Methamphetamine abuse (HCC)   Patient's psychiatric medications were adjusted on admission:  - Restart Prozac  10 mg daily which pt states has been very helpful in the past for MDD/GAD - Restart Risperdal  1mg  BID which pt states was helpful in the past for psychosis  - Start Hydroxyzine  25 mg TID PRN for anxiety - Start Trazodone  50 mg nightly PRN for sleep  During the hospitalization, other adjustments were made to the patient's psychiatric medication regimen:  - Started Nicotine  21 mg patch   Patient's care was discussed during the interdisciplinary team meeting every day during the hospitalization.  The patient denied having side effects to prescribed psychiatric medication.  Gradually, patient started adjusting to milieu. The patient was evaluated each day by a clinical provider to ascertain response to treatment. Improvement was noted by the patient's report of decreasing symptoms, improved sleep and appetite, affect, medication tolerance, behavior, and participation in unit programming.  Patient was asked each day to complete a self inventory  noting mood, mental status, pain, new symptoms, anxiety and concerns.    Symptoms were reported as significantly decreased or resolved completely by discharge.   On day of discharge, the patient reports that their mood is stable. The patient denied having suicidal thoughts for more than 48 hours prior to discharge.  Patient denies having homicidal thoughts.  Patient denies having auditory hallucinations.  Patient denies any visual hallucinations or other symptoms of psychosis. The patient was motivated to continue taking medication with a goal of continued improvement in mental health.   The patient reports their target psychiatric symptoms of anxiety, depression and substance withdrawal responded well to the psychiatric medications, and the patient reports overall benefit other psychiatric hospitalization. Supportive psychotherapy was provided to the patient. The patient also participated in regular group therapy while hospitalized. Coping skills, problem solving as well as relaxation therapies were also part of the unit programming.  Labs were reviewed with the patient, and abnormal results were discussed with the patient.  The patient is able to verbalize their individual safety plan to this provider.  # It is recommended to the patient to continue psychiatric medications as prescribed, after discharge from the hospital.    # It is recommended to the patient to follow up with your outpatient psychiatric provider and PCP.  # It was discussed with the patient, the impact of alcohol, drugs, tobacco have been there overall psychiatric and medical wellbeing, and total abstinence from substance use was recommended the patient.ed.  # Prescriptions provided or sent directly to preferred pharmacy at discharge. Patient agreeable to plan. Given opportunity to ask questions. Appears to feel comfortable with discharge.    # In the event of worsening symptoms, the patient is instructed  to call the crisis  hotline, 911 and or go to the nearest ED for appropriate evaluation and treatment of symptoms. To follow-up with primary care provider for other medical issues, concerns and or health care needs  # Patient was discharged ***to self care with a plan to follow up as noted below.   Total Time spent with patient: {Time; 15 min - 8 hours:17441}  Past Psychiatric History: Patient reports history of Schizophrenia, generalized anxiety, methamphetamine abuse. Denies current outpatient psychiatrist or therapy.  Past Medical History: HIV  Family History: None disclosed  Family Psychiatric  History: None disclosed. Social History:  No developmental issues in school and graduated from high school. Currently homeless and unemployed. Currently on parole     Substance Use:  Tobacco use, 1/2 PPD  Methaphetamine Use, started 2024, 1/2 gram daily via IV  Patient attempted to detox at [facility] June 2025 but was told to go back to [facility]    Additional Social History:  Pain Medications: SEE MAR Prescriptions: SEE MAR Over the Counter: SEE MAR History of alcohol / drug use?: Yes Longest period of sobriety (when/how long): unknown Negative Consequences of Use: Financial, Personal relationships, Work / Programmer, multimedia Withdrawal Symptoms: None, Agitation, Aggressive/Assaultive Name of Substance 1: Meth 1 - Age of First Use: 32 1 - Amount (size/oz): 1/2 gram 1 - Frequency: daily 1 - Last Use / Amount: 03/23/24  Tobacco Cessation:  A prescription for an FDA-approved tobacco cessation medication provided at discharge  Current Medications:  Current Facility-Administered Medications  Medication Dose Route Frequency Provider Last Rate Last Admin   acetaminophen  (TYLENOL ) tablet 650 mg  650 mg Oral Q6H PRN Nkwenti, Doris, NP       alum & mag hydroxide-simeth (MAALOX/MYLANTA) 200-200-20 MG/5ML suspension 30 mL  30 mL Oral Q4H PRN Nkwenti, Doris, NP       haloperidol  (HALDOL ) tablet 5 mg  5 mg Oral TID PRN Tex Drilling,  NP       And   diphenhydrAMINE  (BENADRYL ) capsule 50 mg  50 mg Oral TID PRN Tex Drilling, NP       haloperidol  lactate (HALDOL ) injection 5 mg  5 mg Intramuscular TID PRN Tex Drilling, NP       And   diphenhydrAMINE  (BENADRYL ) injection 50 mg  50 mg Intramuscular TID PRN Tex Drilling, NP       And   LORazepam  (ATIVAN ) injection 2 mg  2 mg Intramuscular TID PRN Tex Drilling, NP       haloperidol  lactate (HALDOL ) injection 10 mg  10 mg Intramuscular TID PRN Tex Drilling, NP       And   diphenhydrAMINE  (BENADRYL ) injection 50 mg  50 mg Intramuscular TID PRN Tex Drilling, NP       And   LORazepam  (ATIVAN ) injection 2 mg  2 mg Intramuscular TID PRN Tex Drilling, NP       dolutegravir -lamiVUDine  (DOVATO ) 50-300 MG per tablet 1 tablet  1 tablet Oral Daily Tex Drilling, NP   1 tablet at 03/26/24 9144   FLUoxetine  (PROZAC ) capsule 10 mg  10 mg Oral Daily Nkwenti, Doris, NP   10 mg at 03/26/24 9144   hydrOXYzine  (ATARAX ) tablet 25 mg  25 mg Oral TID PRN Tex Drilling, NP       magnesium  hydroxide (MILK OF MAGNESIA) suspension 30 mL  30 mL Oral Daily PRN Nkwenti, Doris, NP       nicotine  (NICODERM CQ  - dosed in mg/24 hours) patch 21 mg  21 mg Transdermal  [id number] Lenard Calin, MD   21 mg at 03/26/24 9144   risperiDONE  (RISPERDAL ) tablet 1 mg  1 mg Oral BID Nkwenti, Doris, NP   1 mg at 03/26/24 9144   traZODone  (DESYREL ) tablet 50 mg  50 mg Oral QHS PRN Tex Drilling, NP   50 mg at 03/25/24 2112   Current Outpatient Medications  Medication Sig Dispense Refill   dolutegravir -lamiVUDine  (DOVATO ) 50-300 MG tablet Take 1 tablet by mouth daily. 30 tablet 1   [START ON 03/27/2024] FLUoxetine  (PROZAC ) 10 MG capsule Take 1 capsule (10 mg total) by mouth daily. 30 capsule 0   [START ON 03/27/2024] nicotine  (NICODERM CQ  - DOSED IN MG/24 HOURS) 21 mg/24hr patch Place 1 patch (21 mg total) onto the skin daily at 6 (six) AM. 28 patch 0   risperiDONE  (RISPERDAL ) 1 MG tablet Take 1 tablet (1 mg total)  by mouth 2 (two) times daily. 60 tablet 0   traZODone  (DESYREL ) 50 MG tablet Take 1 tablet (50 mg total) by mouth at bedtime as needed for sleep. 30 tablet 0    PTA Medications:  Facility Ordered Medications  Medication   acetaminophen  (TYLENOL ) tablet 650 mg   alum & mag hydroxide-simeth (MAALOX/MYLANTA) 200-200-20 MG/5ML suspension 30 mL   magnesium  hydroxide (MILK OF MAGNESIA) suspension 30 mL   haloperidol  (HALDOL ) tablet 5 mg   And   diphenhydrAMINE  (BENADRYL ) capsule 50 mg   haloperidol  lactate (HALDOL ) injection 5 mg   And   diphenhydrAMINE  (BENADRYL ) injection 50 mg   And   LORazepam  (ATIVAN ) injection 2 mg   haloperidol  lactate (HALDOL ) injection 10 mg   And   diphenhydrAMINE  (BENADRYL ) injection 50 mg   And   LORazepam  (ATIVAN ) injection 2 mg   hydrOXYzine  (ATARAX ) tablet 25 mg   traZODone  (DESYREL ) tablet 50 mg   FLUoxetine  (PROZAC ) capsule 10 mg   risperiDONE  (RISPERDAL ) tablet 1 mg   dolutegravir -lamiVUDine  (DOVATO ) 50-300 MG per tablet 1 tablet   nicotine  (NICODERM CQ  - dosed in mg/24 hours) patch 21 mg   PTA Medications  Medication Sig   dolutegravir -lamiVUDine  (DOVATO ) 50-300 MG tablet Take 1 tablet by mouth daily.   [START ON 03/27/2024] FLUoxetine  (PROZAC ) 10 MG capsule Take 1 capsule (10 mg total) by mouth daily.   [START ON 03/27/2024] nicotine  (NICODERM CQ  - DOSED IN MG/24 HOURS) 21 mg/24hr patch Place 1 patch (21 mg total) onto the skin daily at 6 (six) AM.   risperiDONE  (RISPERDAL ) 1 MG tablet Take 1 tablet (1 mg total) by mouth 2 (two) times daily.   traZODone  (DESYREL ) 50 MG tablet Take 1 tablet (50 mg total) by mouth at bedtime as needed for sleep.       03/24/2024   11:02 AM 06/09/2023    9:45 AM 07/03/2021    4:23 PM  Depression screen PHQ 2/9  Decreased Interest 1 0 0  Down, Depressed, Hopeless 1 0 0  PHQ - 2 Score 2 0 0  Altered sleeping 2    Tired, decreased energy 2    Change in appetite 2    Feeling bad or failure about yourself  2     Trouble concentrating 2    Moving slowly or fidgety/restless 2    Suicidal thoughts 2    PHQ-9 Score 16      Flowsheet Row ED from 03/24/2024 in [facility] ED from 03/19/2024 in [facility] ED from 03/17/2024 in [facility] Emergency Department at [facility]  C-SSRS RISK CATEGORY No Risk No Risk No Risk    Musculoskeletal  Strength & Muscle Tone: {desc; muscle tone:32375} Gait & Station: {PE GAIT ED WJUO:77474} Patient leans: {Patient Leans:21022755}  Psychiatric Specialty Exam  Presentation  General Appearance:  Appropriate for Environment  Eye Contact: Fair  Speech: Clear and Coherent  Speech Volume: Normal  Handedness: Right   Mood and Affect  Mood: -- (fine)  Affect: Appropriate   Thought Process  Thought Processes: Coherent; Linear; Goal Directed  Descriptions of Associations:Intact  Orientation:Full (Time, Place and Person)  Thought Content:Logical  Diagnosis of Schizophrenia or Schizoaffective disorder in past: Yes  Duration of Psychotic Symptoms: Greater than six months   Hallucinations:Hallucinations: None  Ideas of Reference:None  Suicidal Thoughts:Suicidal Thoughts: No  Homicidal Thoughts:Homicidal Thoughts: No   Sensorium  Memory: Immediate Fair  Judgment: Intact  Insight: Fair   Art therapist  Concentration: Good  Attention Span: Good  Recall: Good  Fund of Knowledge: Good  Language: Good   Psychomotor Activity  Psychomotor Activity: Psychomotor Activity: Normal   Assets  Assets: Communication Skills; Desire for Improvement   Sleep  Sleep: Sleep: Fair  Estimated Sleeping Duration (Last 24 Hours): 15.75-19.25 hours  No data recorded  Physical Exam  Physical Exam ROS Blood pressure 107/74, pulse 97, temperature 98.5 F (36.9 C), temperature source Oral, resp. rate 18, SpO2 98%. There is no height or weight on file to  calculate BMI.  Demographic Factors:  Male, Low socioeconomic status, and Unemployed  Loss Factors: Decrease in vocational status and Financial problems/change in socioeconomic status  Historical Factors: Prior suicide attempts  Risk Reduction Factors:   Sense of responsibility to family  Continued Clinical Symptoms:  {Clinical Factors:22706}  Cognitive Features That Contribute To Risk:  None    Suicide Risk:  {[facility] SUICIDE RISK:22704}  Plan Of Care/Follow-up recommendations:  Activity: as tolerated  Diet: heart healthy  Other: -Follow-up with your outpatient psychiatric provider -instructions on appointment date, time, and address (location) are provided to you in discharge paperwork.  -Take your psychiatric medications as prescribed at discharge - instructions are provided to you in the discharge paperwork  -Follow-up with outpatient primary care doctor and other specialists -for management of preventative medicine and chronic medical disease HIV   -Testing: Follow-up with outpatient provider for abnormal lab results: None   -If you are prescribed an atypical antipsychotic medication, we recommend that your outpatient psychiatrist follow routine screening for side effects within 3 months of discharge, including monitoring: AIMS scale, height, weight, blood pressure, fasting lipid panel, HbA1c, and fasting blood sugar.   -Recommend total abstinence from alcohol, tobacco, and other illicit drug use at discharge.   -If your psychiatric symptoms recur, worsen, or if you have side effects to your psychiatric medications, call your outpatient psychiatric provider, 911, [phone] or go to the nearest emergency department.  -If suicidal thoughts occur, immediately call your outpatient psychiatric provider, 911, [phone] or go to the nearest emergency department.  Disposition: to self care   PATTI OLDEN, MD 03/26/2024, 10:18 AM

## 2024-03-26 NOTE — [facility] Group Notes (Signed)
 SPIRITUALITY GROUP NOTE  Spirituality group facilitated by Chaplain Regena Delucchi, MDiv, [facility].  Group Description: Group focused on topic of hope. Patients participated in facilitated discussion around topic, connecting with one another around experiences and definitions for hope. Group members engaged with visual explorer photos, reflecting on what hope looks like for them today. Group engaged in discussion around how their definitions of hope are present today in hospital.  Modalities: Psycho-social ed, Adlerian, Narrative, MI  Patient Progress: Present throughout group.  Engaged in group discussion

## 2024-03-26 NOTE — ED Provider Notes (Signed)
 Behavioral Health Progress Note  Date and Time: 03/26/2024 9:44 AM Name: Ethan Goodwin MRN:  [id number]  Subjective:    Aramis Weil is a 33 y.o. male with a self reported history of methamphetamine abuse, anxiety and schizophrenia who presented to [facility] as a walk in unaccompanied by with complaints of wanting detox methamphetamines. He was transferred to the facility basis crisis unit for detox.  Patient was assessed in his room this morning. Patient reports that he feels okay and denies any withdrawal symptoms.  Patient spoke with social work yesterday and will walk in for intensive outpatient and behavioral health urgent care to establish for treatment.  Patient also motivated to establish with providers for medication management as well.  Regarding his mood, patient denies any depressive or anxiety symptoms. He denies suicidal ideations, homicidal ideations or auditory visual hallucinations. Patient denies side effects with risperidone  and fluoxetine ..  Patient states that his friend is aware of hospitalization and he is welcome to come stay with him once stable for discharge.  Diagnosis:  Final diagnoses:  Methamphetamine abuse (HCC)  Generalized anxiety disorder  Self reported history of schizophrenia   Total Time spent with patient: 20 minutes  Past Psychiatric History: Patient reports history of Schizophrenia, generalized anxiety, methamphetamine abuse. Denies current outpatient psychiatrist or therapy.  Past Medical History: HIV  Family History: None disclosed  Family Psychiatric  History: None disclosed. Social History:  No developmental issues in school and graduated from high school. Currently homeless and unemployed. Currently on parole    Substance Use:  Tobacco use, 1/2 PPD  Methaphetamine Use, started 2024, 1/2 gram daily via IV  Patient attempted to detox at [facility] June 2025 but was told to go back to [facility]   Additional Social History:    Pain Medications: SEE  MAR Prescriptions: SEE MAR Over the Counter: SEE MAR History of alcohol / drug use?: Yes Longest period of sobriety (when/how long): unknown Negative Consequences of Use: Financial, Personal relationships, Work / Programmer, multimedia Withdrawal Symptoms: None, Agitation, Aggressive/Assaultive Name of Substance 1: Meth 1 - Age of First Use: 32 1 - Amount (size/oz): 1/2 gram 1 - Frequency: daily 1 - Last Use / Amount: 03/23/24                  Sleep: Fair  Appetite:  Fair  Current Medications:  Current Facility-Administered Medications  Medication Dose Route Frequency Provider Last Rate Last Admin   acetaminophen  (TYLENOL ) tablet 650 mg  650 mg Oral Q6H PRN Nkwenti, Doris, NP       alum & mag hydroxide-simeth (MAALOX/MYLANTA) 200-200-20 MG/5ML suspension 30 mL  30 mL Oral Q4H PRN Nkwenti, Doris, NP       haloperidol  (HALDOL ) tablet 5 mg  5 mg Oral TID PRN Tex Drilling, NP       And   diphenhydrAMINE  (BENADRYL ) capsule 50 mg  50 mg Oral TID PRN Tex Drilling, NP       haloperidol  lactate (HALDOL ) injection 5 mg  5 mg Intramuscular TID PRN Tex Drilling, NP       And   diphenhydrAMINE  (BENADRYL ) injection 50 mg  50 mg Intramuscular TID PRN Tex Drilling, NP       And   LORazepam  (ATIVAN ) injection 2 mg  2 mg Intramuscular TID PRN Tex Drilling, NP       haloperidol  lactate (HALDOL ) injection 10 mg  10 mg Intramuscular TID PRN Tex Drilling, NP       And   diphenhydrAMINE  (BENADRYL ) injection  50 mg  50 mg Intramuscular TID PRN Tex Drilling, NP       And   LORazepam  (ATIVAN ) injection 2 mg  2 mg Intramuscular TID PRN Tex Drilling, NP       dolutegravir -lamiVUDine  (DOVATO ) 50-300 MG per tablet 1 tablet  1 tablet Oral Daily Tex Drilling, NP   1 tablet at 03/26/24 9144   FLUoxetine  (PROZAC ) capsule 10 mg  10 mg Oral Daily Tex Drilling, NP   10 mg at 03/26/24 9144   hydrOXYzine  (ATARAX ) tablet 25 mg  25 mg Oral TID PRN Tex Drilling, NP       magnesium  hydroxide (MILK OF  MAGNESIA) suspension 30 mL  30 mL Oral Daily PRN Tex Drilling, NP       nicotine  (NICODERM CQ  - dosed in mg/24 hours) patch 21 mg  21 mg Transdermal Q0600 Lenard Calin, MD   21 mg at 03/26/24 0855   risperiDONE  (RISPERDAL ) tablet 1 mg  1 mg Oral BID Tex Drilling, NP   1 mg at 03/26/24 9144   traZODone  (DESYREL ) tablet 50 mg  50 mg Oral QHS PRN Tex Drilling, NP   50 mg at 03/25/24 2112   Current Outpatient Medications  Medication Sig Dispense Refill   dolutegravir -lamiVUDine  (DOVATO ) 50-300 MG tablet Take 1 tablet by mouth daily. 30 tablet 1   doxycycline  (VIBRAMYCIN ) 100 MG capsule Take 1 capsule (100 mg total) by mouth 2 (two) times daily. (Patient not taking: Reported on 03/24/2024) 20 capsule 0    Labs  Lab Results:  Admission on 03/24/2024  Component Date Value Ref Range Status   WBC 03/24/2024 6.3  4.0 - 10.5 K/uL Final   RBC 03/24/2024 4.37  4.22 - 5.81 MIL/uL Final   Hemoglobin 03/24/2024 13.8  13.0 - 17.0 g/dL Final   HCT 93/74/7974 41.9  39.0 - 52.0 % Final   MCV 03/24/2024 95.9  80.0 - 100.0 fL Final   MCH 03/24/2024 31.6  26.0 - 34.0 pg Final   MCHC 03/24/2024 32.9  30.0 - 36.0 g/dL Final   RDW 93/74/7974 13.1  11.5 - 15.5 % Final   Platelets 03/24/2024 201  150 - 400 K/uL Final   nRBC 03/24/2024 0.0  0.0 - 0.2 % Final   Neutrophils Relative % 03/24/2024 58  % Final   Neutro Abs 03/24/2024 3.6  1.7 - 7.7 K/uL Final   Lymphocytes Relative 03/24/2024 33  % Final   Lymphs Abs 03/24/2024 2.1  0.7 - 4.0 K/uL Final   Monocytes Relative 03/24/2024 7  % Final   Monocytes Absolute 03/24/2024 0.4  0.1 - 1.0 K/uL Final   Eosinophils Relative 03/24/2024 1  % Final   Eosinophils Absolute 03/24/2024 0.1  0.0 - 0.5 K/uL Final   Basophils Relative 03/24/2024 0  % Final   Basophils Absolute 03/24/2024 0.0  0.0 - 0.1 K/uL Final   Immature Granulocytes 03/24/2024 1  % Final   Abs Immature Granulocytes 03/24/2024 0.06  0.00 - 0.07 K/uL Final   Performed at [facility],  [address] [place], [place] [address]   Sodium 03/24/2024 139  135 - 145 mmol/L Final   Potassium 03/24/2024 3.8  3.5 - 5.1 mmol/L Final   Chloride 03/24/2024 107  98 - 111 mmol/L Final   CO2 03/24/2024 23  22 - 32 mmol/L Final   Glucose, Bld 03/24/2024 100 (H)  70 - 99 mg/dL Final   Glucose reference range applies only to samples taken after fasting for at least  8 hours.   BUN 03/24/2024 16  6 - 20 mg/dL Final   Creatinine, Ser 03/24/2024 0.98  0.61 - 1.24 mg/dL Final   Calcium 93/74/7974 9.3  8.9 - 10.3 mg/dL Final   Total Protein 93/74/7974 7.9  6.5 - 8.1 g/dL Final   Albumin 93/74/7974 3.7  3.5 - 5.0 g/dL Final   AST 93/74/7974 23  15 - 41 U/L Final   ALT 03/24/2024 19  0 - 44 U/L Final   Alkaline Phosphatase 03/24/2024 87  38 - 126 U/L Final   Total Bilirubin 03/24/2024 1.4 (H)  0.0 - 1.2 mg/dL Final   GFR, Estimated 03/24/2024 >60  >60 mL/min Final   Comment: (NOTE) Calculated using the CKD-EPI Creatinine Equation (2021)    Anion gap 03/24/2024 9  5 - 15 Final   Performed at [facility], [address], [place], [place] [address]   Hgb A1c MFr Bld 03/24/2024 5.3  4.8 - 5.6 % Final   Comment: (NOTE) Diagnosis of Diabetes The following HbA1c ranges recommended by the American Diabetes Association (ADA) may be used as an aid in the diagnosis of diabetes mellitus.  Hemoglobin             Suggested A1C NGSP%              Diagnosis  <5.7                   Non Diabetic  5.7-6.4                Pre-Diabetic  >6.4                   Diabetic  <7.0                   Glycemic control for                       adults with diabetes.     Mean Plasma Glucose 03/24/2024 105.41  mg/dL Final   Performed at [facility], [address] [place], [place] [address]   Alcohol, Ethyl (B) 03/24/2024 <15  <15 mg/dL Final   Comment: (NOTE) For medical purposes only. Performed at [facility], [address] [place], [place] [address]    Cholesterol 03/24/2024 127  0 -  200 mg/dL Final   Triglycerides 93/74/7974 49  <150 mg/dL Final   HDL 93/74/7974 54  >40 mg/dL Final   Total CHOL/HDL Ratio 03/24/2024 2.4  RATIO Final   VLDL 03/24/2024 10  0 - 40 mg/dL Final   LDL Cholesterol 03/24/2024 63  0 - 99 mg/dL Final   Comment:        Total Cholesterol/HDL:CHD Risk Coronary Heart Disease Risk Table                     Men   Women  1/2 Average Risk   3.4   3.3  Average Risk       5.0   4.4  2 X Average Risk   9.6   7.1  3 X Average Risk  23.4   11.0        Use the calculated Patient Ratio above and the CHD Risk Table to determine the patient's CHD Risk.        ATP III CLASSIFICATION (LDL):  <100     mg/dL   Optimal  899-870  mg/dL   Near or  Above                    Optimal  130-159  mg/dL   Borderline  839-810  mg/dL   High  >809     mg/dL   Very High Performed at [facility], [address] [place], [place] [address]    TSH 03/24/2024 1.305  0.350 - 4.500 uIU/mL Final   Comment: Performed by a 3rd Generation assay with a functional sensitivity of <=0.01 uIU/mL. Performed at [facility], [address] [place], [place] [address]    Color, Urine 03/24/2024 AMBER (A)  YELLOW Final   BIOCHEMICALS MAY BE AFFECTED BY COLOR   APPearance 03/24/2024 CLEAR  CLEAR Final   Specific Gravity, Urine 03/24/2024 1.030  1.005 - 1.030 Final   pH 03/24/2024 5.0  5.0 - 8.0 Final   Glucose, UA 03/24/2024 NEGATIVE  NEGATIVE mg/dL Final   Hgb urine dipstick 03/24/2024 NEGATIVE  NEGATIVE Final   Bilirubin Urine 03/24/2024 NEGATIVE  NEGATIVE Final   Ketones, ur 03/24/2024 NEGATIVE  NEGATIVE mg/dL Final   Protein, ur 93/74/7974 NEGATIVE  NEGATIVE mg/dL Final   Nitrite 93/74/7974 NEGATIVE  NEGATIVE Final   Leukocytes,Ua 03/24/2024 NEGATIVE  NEGATIVE Final   Performed at [facility], [address] [place], [place] [address]   POC Amphetamine UR 03/24/2024 Positive (A)  NONE DETECTED (Cut Off Level 1000 ng/mL) Final   POC Secobarbital (BAR)  03/24/2024 None Detected  NONE DETECTED (Cut Off Level 300 ng/mL) Final   POC Buprenorphine (BUP) 03/24/2024 None Detected  NONE DETECTED (Cut Off Level 10 ng/mL) Final   POC Oxazepam (BZO) 03/24/2024 None Detected  NONE DETECTED (Cut Off Level 300 ng/mL) Final   POC Cocaine UR 03/24/2024 None Detected  NONE DETECTED (Cut Off Level 300 ng/mL) Final   POC Methamphetamine UR 03/24/2024 Positive (A)  NONE DETECTED (Cut Off Level 1000 ng/mL) Final   POC Morphine  03/24/2024 None Detected  NONE DETECTED (Cut Off Level 300 ng/mL) Final   POC Methadone UR 03/24/2024 None Detected  NONE DETECTED (Cut Off Level 300 ng/mL) Final   POC Oxycodone  UR 03/24/2024 None Detected  NONE DETECTED (Cut Off Level 100 ng/mL) Final   POC Marijuana UR 03/24/2024 None Detected  NONE DETECTED (Cut Off Level 50 ng/mL) Final    Blood Alcohol level:  Lab Results  Component Value Date   [facility] <15 03/24/2024    Metabolic Disorder Labs: Lab Results  Component Value Date   HGBA1C 5.3 03/24/2024   MPG 105.41 03/24/2024   No results found for: PROLACTIN Lab Results  Component Value Date   CHOL 127 03/24/2024   TRIG 49 03/24/2024   HDL 54 03/24/2024   CHOLHDL 2.4 03/24/2024   VLDL 10 03/24/2024   LDLCALC 63 03/24/2024   LDLCALC 68 08/03/2021    Therapeutic Lab Levels: No results found for: LITHIUM No results found for: VALPROATE No results found for: CBMZ  Physical Findings   PHQ2-9    Flowsheet Row ED from 03/24/2024 in [facility] Office Visit from 06[facility]09/2023 in [facility] [place]. [facility] Office Visit from 07[facility]03/2021 in [facility] - [facility] Dept Of [place]. [facility] Office Visit from 03/29/2021 in [facility] - [facility] Dept Of [place]. [facility]  PHQ-2 Total Score 2 0 0 2  PHQ-9 Total Score  16 -- -- 4   Flowsheet Row ED from 03/24/2024 in [facility] ED from 03/19/2024 in [facility] ED from 03/17/2024 in [facility] Emergency Department at [facility]  C-SSRS RISK CATEGORY No Risk No Risk No Risk     Musculoskeletal  Strength & Muscle Tone: within normal limits Gait & Station: normal Patient leans: N/A  Psychiatric Specialty Exam  Presentation  General Appearance:  Appropriate for Environment  Eye Contact: Fair  Speech: Clear and Coherent  Speech Volume: Normal  Handedness: Right   Mood and Affect  Mood: -- (fine)  Affect: Appropriate   Thought Process  Thought Processes: Coherent; Linear; Goal Directed  Descriptions of Associations:Intact  Orientation:Full (Time, Place and Person)  Thought Content:Logical  Diagnosis of Schizophrenia or Schizoaffective disorder in past: Yes  Duration of Psychotic Symptoms: Greater than six months   Hallucinations:Hallucinations: None  Ideas of Reference:None  Suicidal Thoughts:Suicidal Thoughts: No  Homicidal Thoughts:Homicidal Thoughts: No   Sensorium  Memory: Immediate Fair  Judgment: Intact  Insight: Fair   Art therapist  Concentration: Good  Attention Span: Good  Recall: Good  Fund of Knowledge: Good  Language: Good   Psychomotor Activity  Psychomotor Activity: Psychomotor Activity: Normal   Assets  Assets: Communication Skills; Desire for Improvement   Sleep  Sleep: Sleep: Fair  Estimated Sleeping Duration (Last 24 Hours): 15.50-19.00 hours  No data recorded   Physical Exam  Physical Exam Constitutional:      General: He is not in acute distress.    Appearance: Normal appearance. He is not ill-appearing, toxic-appearing or diaphoretic.   Eyes:     Conjunctiva/sclera: Conjunctivae normal.   Pulmonary:     Effort: Pulmonary effort is normal.   Musculoskeletal:        General: Normal range of motion.   Neurological:     Mental Status: He is alert and oriented  to person, place, and time.    Review of Systems  Constitutional:  Negative for chills, fever and malaise/fatigue.  Respiratory:  Negative for cough.   Gastrointestinal:  Negative for constipation, nausea and vomiting.  Neurological:  Negative for headaches.  Psychiatric/Behavioral:  Negative for depression, hallucinations, substance abuse and suicidal ideas. The patient is not nervous/anxious and does not have insomnia.    Blood pressure 107/74, pulse 97, temperature 98.5 F (36.9 C), temperature source Oral, resp. rate 18, SpO2 98%. There is no height or weight on file to calculate BMI.  Medical Decision Making   Kaidin Boehle is a 33 y.o. male with a self reported history of methamphetamine abuse, anxiety and schizophrenia who presented to [facility] as a walk in unaccompanied by with complaints of wanting detox methamphetamines. He was transferred to the facility basis crisis unit for detox.   Upon my assessment, the patient hopes to establish with intensive outpatient treatment to optimize substance treatment. Also hope. His mood/psychotic symptoms are well controlled on current home regimen and he also hopes to establish with behavioral health urgent outpatient clinic for med management . Patient will continue to stay on unit for monitoring for withdrawal symptoms and consider discharge this weekend.  Recommendations:   Refer to [facility] for detailed medications  - Continue Prozac  10 mg daily which pt states has been very helpful in the past for GAD - Continue Risperdal  1mg  BID which pt states was helpful in the past for psychosis/self disclosed schizophrenia diagnosis   - Continue Hydroxyzine  25 mg TID PRN for anxiety  Continue Trazodone  50 mg nightly PRN for sleep - Abstinence from substances encouraged  - SW following, patient desires intensive outpatient treatment and will attempt to establish as a walk in appreciate assistance    PATTI OLDEN, MD 03/26/2024 9:44 AM

## 2024-03-26 NOTE — ED Notes (Signed)
 Patient is sleeping. Respirations equal and unlabored. No change in assessment or acuity. Routine safety checks conducted according to facility protocol.

## 2024-03-26 NOTE — Group Note (Signed)
 Group Topic: Relaxation  Group Date: 03/26/2024 Start Time: 1730 End Time: 1801 Facilitators: Judi Monico RAMAN, NT  Department: [facility]  Number of Participants: 3  Group Focus: check in, feeling awareness/expression, and relaxation Treatment Modality:  Psychoeducation Interventions utilized were leisure development Purpose: enhance coping skills, explore maladaptive thinking, express feelings, express irrational fears, improve communication skills, and increase insight  Name: Bion Todorov Date of Birth: 08/10/1991  MR: [id number]    Level of Participation: active Quality of Participation: attentive Interactions with others: gave feedback Mood/Affect: appropriate Triggers (if applicable): n/a Cognition: coherent/clear Progress: Gaining insight Response: n/a Plan: patient will be encouraged to attend future groups  Patients Problems:  Patient Active Problem List   Diagnosis Date Noted   Substance induced mood disorder (HCC) 03/24/2024   Abscess of forearm, right 03/29/2021   Cellulitis of right hand 04/10/2020   Positive blood cultures 04/10/2020   IVDU (intravenous drug user)    HIV disease (HCC) 10/20/2019   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

## 2024-03-26 NOTE — ED Notes (Signed)
 Pt was provided lunch

## 2024-03-26 NOTE — Group Note (Signed)
 Group Topic: Recovery Basics  Group Date: 03/26/2024 Start Time: 1530 End Time: 1630 Facilitators: Judi Monico RAMAN, NT  Department: [facility]  Number of Participants: 2  Group Focus: check in and coping skills Treatment Modality:  Psychoeducation Interventions utilized were exploration and patient education Purpose: enhance coping skills, explore maladaptive thinking, express feelings, express irrational fears, improve communication skills, and increase insight  Name: Damontre Millea Date of Birth: 12-31-1990  MR: [id number]    Level of Participation: active Quality of Participation: attentive Interactions with others: gave feedback Mood/Affect: appropriate Triggers (if applicable): n/a Cognition: coherent/clear Progress: Gaining insight Response: n/a Plan: follow-up needed  Patients Problems:  Patient Active Problem List   Diagnosis Date Noted   Substance induced mood disorder (HCC) 03/24/2024   Abscess of forearm, right 03/29/2021   Cellulitis of right hand 04/10/2020   Positive blood cultures 04/10/2020   IVDU (intravenous drug user)    HIV disease (HCC) 10/20/2019   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

## 2024-03-26 NOTE — ED Notes (Signed)
 Pt was assessed in the bedroom, he denied SI/HI/AVH. Pt denies depression, sadness. Pt reports he sleeps well, nutrition intake is adequate. Pt reports not having BM for 3 days. Staff asked him if he needs medication him, pt responded, that is his baseline. Pt gave for his medication with no further complaints.

## 2024-03-27 DIAGNOSIS — F411 Generalized anxiety disorder: Secondary | ICD-10-CM | POA: Diagnosis not present

## 2024-03-27 DIAGNOSIS — Z8659 Personal history of other mental and behavioral disorders: Secondary | ICD-10-CM

## 2024-03-27 DIAGNOSIS — F1510 Other stimulant abuse, uncomplicated: Secondary | ICD-10-CM | POA: Diagnosis not present

## 2024-03-27 NOTE — ED Notes (Signed)
 Patient is sleeping. Respirations equal and unlabored. No change in assessment or acuity. Routine safety checks conducted according to facility protocol.

## 2024-03-27 NOTE — ED Notes (Signed)
 Patient A&Ox4. Denies intent to harm self/others when asked. Denies A/VH. Patient denies any physical complaints when asked. No acute distress noted. Support and encouragement provided. Routine safety checks conducted according to facility protocol. Encouraged patient to notify staff if thoughts of harm toward self or others arise. Patient verbalize understanding and agreement. Will continue to monitor for safety.

## 2024-03-27 NOTE — ED Provider Notes (Signed)
 FBC/OBS ASAP Discharge Summary  Date and Time: 03/27/2024 9:54 AM  Name: Ethan Goodwin  MRN:  [id number]   Discharge Diagnoses:  Final diagnoses:  Methamphetamine abuse (HCC)  Generalized anxiety disorder  Self reported history of schizophrenia    Subjective:  [name] is a 33 y.o. male with a self reported history of methamphetamine abuse, anxiety and schizophrenia who presented to [facility] as a walk in unaccompanied by with complaints of wanting detox methamphetamines. He was transferred to the facility basis crisis unit for detox.   Stay Summary:  During the patient's hospitalization, patient had extensive initial psychiatric evaluation, and follow-up psychiatric evaluations every day.   Psychiatric diagnoses provided upon initial assessment:  MDD (major depressive disorder), recurrent, severe, with psychosis (HCC)  Methamphetamine abuse (HCC)    Patient's psychiatric medications were adjusted on admission:  - Restart Prozac  10 mg daily which pt states has been very helpful in the past for MDD/GAD - Restart Risperdal  1mg  BID which pt states was helpful in the past for psychosis  - Start Hydroxyzine  25 mg TID PRN for anxiety - Start Trazodone  50 mg nightly PRN for sleep   During the hospitalization, other adjustments were made to the patient's psychiatric medication regimen:  - Started Nicotine  21 mg patch    Patient's care was discussed during the interdisciplinary team meeting every day during the hospitalization.   The patient denied having side effects to prescribed psychiatric medication.   Gradually, patient started adjusting to milieu. The patient was evaluated each day by a clinical provider to ascertain response to treatment. Improvement was noted by the patient's report of decreasing symptoms, improved sleep and appetite, affect, medication tolerance, behavior, and participation in unit programming.  Patient was asked each day to complete a self inventory noting  mood, mental status, pain, new symptoms, anxiety and concerns.     Symptoms were reported as significantly decreased or resolved completely by discharge.    On day of discharge, the patient reports that their mood is stable. The patient denied having suicidal thoughts for more than 48 hours prior to discharge.  Patient denies having homicidal thoughts.  Patient denies having auditory hallucinations.  Patient denies any visual hallucinations or other symptoms of psychosis. The patient was motivated to continue taking medication with a goal of continued improvement in mental health.    The patient reports their target psychiatric symptoms of anxiety, depression and substance withdrawal responded well to the psychiatric medications, and the patient reports overall benefit other psychiatric hospitalization. Supportive psychotherapy was provided to the patient. The patient also participated in regular group therapy while hospitalized. Coping skills, problem solving as well as relaxation therapies were also part of the unit programming.   Labs were reviewed with the patient, and abnormal results were discussed with the patient.   The patient is able to verbalize their individual safety plan to this provider.   # It is recommended to the patient to continue psychiatric medications as prescribed, after discharge from the hospital.     # It is recommended to the patient to follow up with your outpatient psychiatric provider and PCP.   # It was discussed with the patient, the impact of alcohol, drugs, tobacco have been there overall psychiatric and medical wellbeing, and total abstinence from substance use was recommended the patient.ed.   # Prescriptions provided or sent directly to preferred pharmacy at discharge. Patient agreeable to plan. Given opportunity to ask questions. Appears to feel comfortable with discharge.    #  In the event of worsening symptoms, the patient is instructed to call the crisis  hotline, 911 and or go to the nearest ED for appropriate evaluation and treatment of symptoms. To follow-up with primary care provider for other medical issues, concerns and or health care needs   # Patient was discharged to self care with a plan to follow up as noted below.   On day of discharge he reports he is feeling good.  He reports that he is having no side effects to his medications.  He reports he slept good.  He reports his appetite is doing good.  Discussed that information for walk in hours will be provided so he can establish with outpatient services and he reported understanding.  Discussed with him what to do in the event of a future crisis.  Discussed that he can go to [facility], go to the nearest ED, or call 911 or [phone].   He reported understanding and had no concerns.  He reports no SI, HI, or AVH.  He reports no other concerns at present.  He reports no other concerns at present.    Total Time spent with patient: 20 minutes  Past Psychiatric History: Patient reports history of Schizophrenia, generalized anxiety, methamphetamine abuse. Denies current outpatient psychiatrist or therapy.  Past Medical History: HIV  Family History: None disclosed  Family Psychiatric  History: None disclosed. Social History:  No developmental issues in school and graduated from high school. Currently homeless and unemployed. Currently on parole  Substance Use:  Tobacco use, 1/2 PPD  Methaphetamine Use, started 2024, 1/2 gram daily via IV  Patient attempted to detox at [facility] June 2025 but was told to go back to [facility]     Tobacco Cessation:  A prescription for an FDA-approved tobacco cessation medication provided at discharge  Current Medications:  Current Facility-Administered Medications  Medication Dose Route Frequency Provider Last Rate Last Admin   acetaminophen  (TYLENOL ) tablet 650 mg  650 mg Oral Q6H PRN Tex Drilling, NP       alum & mag hydroxide-simeth (MAALOX/MYLANTA) 200-200-20 MG/5ML  suspension 30 mL  30 mL Oral Q4H PRN Nkwenti, Doris, NP       haloperidol  (HALDOL ) tablet 5 mg  5 mg Oral TID PRN Tex Drilling, NP       And   diphenhydrAMINE  (BENADRYL ) capsule 50 mg  50 mg Oral TID PRN Tex Drilling, NP       haloperidol  lactate (HALDOL ) injection 5 mg  5 mg Intramuscular TID PRN Tex Drilling, NP       And   diphenhydrAMINE  (BENADRYL ) injection 50 mg  50 mg Intramuscular TID PRN Tex Drilling, NP       And   LORazepam  (ATIVAN ) injection 2 mg  2 mg Intramuscular TID PRN Tex Drilling, NP       haloperidol  lactate (HALDOL ) injection 10 mg  10 mg Intramuscular TID PRN Tex Drilling, NP       And   diphenhydrAMINE  (BENADRYL ) injection 50 mg  50 mg Intramuscular TID PRN Tex Drilling, NP       And   LORazepam  (ATIVAN ) injection 2 mg  2 mg Intramuscular TID PRN Tex Drilling, NP       dolutegravir -lamiVUDine  (DOVATO ) 50-300 MG per tablet 1 tablet  1 tablet Oral Daily Tex Drilling, NP   1 tablet at 03/26/24 0855   FLUoxetine  (PROZAC ) capsule 10 mg  10 mg Oral Daily Nkwenti, Doris, NP   10 mg at 03/26/24 0855   hydrOXYzine  (  ATARAX ) tablet 25 mg  25 mg Oral TID PRN Tex Drilling, NP       magnesium  hydroxide (MILK OF MAGNESIA) suspension 30 mL  30 mL Oral Daily PRN Nkwenti, Doris, NP       nicotine  (NICODERM CQ  - dosed in mg/24 hours) patch 21 mg  21 mg Transdermal Q0600 Lenard Calin, MD   21 mg at 03/26/24 9144   risperiDONE  (RISPERDAL ) tablet 1 mg  1 mg Oral BID Nkwenti, Doris, NP   1 mg at 03/26/24 2114   traZODone  (DESYREL ) tablet 50 mg  50 mg Oral QHS PRN Tex Drilling, NP   50 mg at 03/25/24 2112   Current Outpatient Medications  Medication Sig Dispense Refill   dolutegravir -lamiVUDine  (DOVATO ) 50-300 MG tablet Take 1 tablet by mouth daily. 30 tablet 1   FLUoxetine  (PROZAC ) 10 MG capsule Take 1 capsule (10 mg total) by mouth daily. 30 capsule 0   nicotine  (NICODERM CQ  - DOSED IN MG/24 HOURS) 21 mg/24hr patch Place 1 patch (21 mg total) onto the skin daily at  6 (six) AM. 28 patch 0   risperiDONE  (RISPERDAL ) 1 MG tablet Take 1 tablet (1 mg total) by mouth 2 (two) times daily. 60 tablet 0   traZODone  (DESYREL ) 50 MG tablet Take 1 tablet (50 mg total) by mouth at bedtime as needed for sleep. 30 tablet 0    PTA Medications:  Facility Ordered Medications  Medication   acetaminophen  (TYLENOL ) tablet 650 mg   alum & mag hydroxide-simeth (MAALOX/MYLANTA) 200-200-20 MG/5ML suspension 30 mL   magnesium  hydroxide (MILK OF MAGNESIA) suspension 30 mL   haloperidol  (HALDOL ) tablet 5 mg   And   diphenhydrAMINE  (BENADRYL ) capsule 50 mg   haloperidol  lactate (HALDOL ) injection 5 mg   And   diphenhydrAMINE  (BENADRYL ) injection 50 mg   And   LORazepam  (ATIVAN ) injection 2 mg   haloperidol  lactate (HALDOL ) injection 10 mg   And   diphenhydrAMINE  (BENADRYL ) injection 50 mg   And   LORazepam  (ATIVAN ) injection 2 mg   hydrOXYzine  (ATARAX ) tablet 25 mg   traZODone  (DESYREL ) tablet 50 mg   FLUoxetine  (PROZAC ) capsule 10 mg   risperiDONE  (RISPERDAL ) tablet 1 mg   dolutegravir -lamiVUDine  (DOVATO ) 50-300 MG per tablet 1 tablet   nicotine  (NICODERM CQ  - dosed in mg/24 hours) patch 21 mg   PTA Medications  Medication Sig   dolutegravir -lamiVUDine  (DOVATO ) 50-300 MG tablet Take 1 tablet by mouth daily.   FLUoxetine  (PROZAC ) 10 MG capsule Take 1 capsule (10 mg total) by mouth daily.   nicotine  (NICODERM CQ  - DOSED IN MG/24 HOURS) 21 mg/24hr patch Place 1 patch (21 mg total) onto the skin daily at 6 (six) AM.   risperiDONE  (RISPERDAL ) 1 MG tablet Take 1 tablet (1 mg total) by mouth 2 (two) times daily.   traZODone  (DESYREL ) 50 MG tablet Take 1 tablet (50 mg total) by mouth at bedtime as needed for sleep.       03/27/2024    8:36 AM 03/24/2024   11:02 AM 06/09/2023    9:45 AM  Depression screen PHQ 2/9  Decreased Interest 0 1 0  Down, Depressed, Hopeless 0 1 0  PHQ - 2 Score 0 2 0  Altered sleeping 0 2   Tired, decreased energy 0 2   Change in appetite 0 2    Feeling bad or failure about yourself  0 2   Trouble concentrating 0 2   Moving slowly or fidgety/restless 0 2   Suicidal thoughts  0 2   PHQ-9 Score 0 16   Difficult doing work/chores Not difficult at all      Flowsheet Row ED from 03/24/2024 in [facility] ED from 03/19/2024 in [facility] ED from 03/17/2024 in [facility] Emergency Department at [facility]  C-SSRS RISK CATEGORY No Risk No Risk No Risk    Musculoskeletal  Strength & Muscle Tone: within normal limits Gait & Station: normal Patient leans: N/A  Psychiatric Specialty Exam  Presentation  General Appearance:  Appropriate for Environment; Casual  Eye Contact: Good  Speech: Clear and Coherent; Normal Rate  Speech Volume: Normal  Handedness: Right   Mood and Affect  Mood: Euthymic  Affect: Congruent; Appropriate   Thought Process  Thought Processes: Coherent; Goal Directed  Descriptions of Associations:Intact  Orientation:Full (Time, Place and Person)  Thought Content:Logical; WDL  Diagnosis of Schizophrenia or Schizoaffective disorder in past: Yes  Duration of Psychotic Symptoms: Greater than six months   Hallucinations:Hallucinations: None  Ideas of Reference:None  Suicidal Thoughts:Suicidal Thoughts: No  Homicidal Thoughts:Homicidal Thoughts: No   Sensorium  Memory: Immediate Fair  Judgment: Fair  Insight: Fair   Executive Functions  Concentration: Good  Attention Span: Good  Recall: Good  Fund of Knowledge: Good  Language: Good   Psychomotor Activity  Psychomotor Activity: Psychomotor Activity: Normal   Assets  Assets: Communication Skills; Desire for Improvement   Sleep  Sleep: Sleep: Good  Estimated Sleeping Duration (Last 24 Hours): 15.75-17.25 hours  No data recorded  Physical Exam  Physical Exam Vitals and nursing note reviewed.  Constitutional:      General: He is not  in acute distress.    Appearance: Normal appearance. He is normal weight. He is not ill-appearing or toxic-appearing.  HENT:     Head: Normocephalic and atraumatic.  Pulmonary:     Effort: Pulmonary effort is normal.   Musculoskeletal:        General: Normal range of motion.   Neurological:     General: No focal deficit present.     Mental Status: He is alert.    Review of Systems  Respiratory:  Negative for cough and shortness of breath.   Cardiovascular:  Negative for chest pain.  Gastrointestinal:  Negative for abdominal pain, constipation, diarrhea, nausea and vomiting.  Neurological:  Negative for dizziness, weakness and headaches.  Psychiatric/Behavioral:  Negative for depression, hallucinations and suicidal ideas. The patient is not nervous/anxious.    Blood pressure 112/72, pulse 99, temperature 97.8 F (36.6 C), resp. rate 18, SpO2 97%. There is no height or weight on file to calculate BMI.  Demographic Factors:  Male  Loss Factors: Decrease in vocational status and Financial problems/change in socioeconomic status  Historical Factors: Prior suicide attempts  Risk Reduction Factors:   Sense of responsibility to family  Continued Clinical Symptoms:  More than one psychiatric diagnosis  Cognitive Features That Contribute To Risk:  None    Suicide Risk:  Minimal: No identifiable suicidal ideation.  Patients presenting with no risk factors but with morbid ruminations; may be classified as minimal risk based on the severity of the depressive symptoms.  However, due to a history of Prior Suicide Attempts there is some chronic risk present.  Plan Of Care/Follow-up recommendations:  Activity: as tolerated   Diet: heart healthy   Other: -Follow-up with your outpatient psychiatric provider -instructions on appointment date, time, and address (location) are provided to you in discharge paperwork.   -Take your psychiatric  medications as prescribed at discharge -  instructions are provided to you in the discharge paperwork   -Follow-up with outpatient primary care doctor and other specialists -for management of preventative medicine and chronic medical disease HIV    -Testing: Follow-up with outpatient provider for abnormal lab results: None    -If you are prescribed an atypical antipsychotic medication, we recommend that your outpatient psychiatrist follow routine screening for side effects within 3 months of discharge, including monitoring: AIMS scale, height, weight, blood pressure, fasting lipid panel, HbA1c, and fasting blood sugar.    -Recommend total abstinence from alcohol, tobacco, and other illicit drug use at discharge.    -If your psychiatric symptoms recur, worsen, or if you have side effects to your psychiatric medications, call your outpatient psychiatric provider, 911, 988 or go to the nearest emergency department.   -If suicidal thoughts occur, immediately call your outpatient psychiatric provider, 911, 988 or go to the nearest emergency department.  Disposition: Self Care  Marsa GORMAN Rosser, DO 03/27/2024, 9:54 AM

## 2024-03-27 NOTE — ED Notes (Signed)
 Patient A&O x 4, ambulatory. Patient discharged in no acute distress. Patient denied SI/HI, A/VH upon discharge. Patient verbalized understanding of all discharge instructions reviewed on AVS via staff, to include follow up appointments, RX's and safety. Suicide safety plan completed and reviewed with Clinical research associate. A copy given to pt. Patient reported mood 10/10.  Pt belongings returned to patient from locker #19 intact. Patient escorted to lobby via staff with bus pass in hand. Safety maintained.

## 2024-03-27 NOTE — Group Note (Signed)
 Group Topic: Recovery Basics  Group Date: 03/27/2024 Start Time: 1045 End Time: 1050 Facilitators: Herold Lajuana NOVAK, RN  Department: [facility]  Number of Participants: 1  Group Focus: discharge education Treatment Modality:  Individual Therapy Interventions utilized were patient education Purpose: increase insight and relapse prevention strategies  Name: Ethan Goodwin Date of Birth: 1991/06/17  MR: [id number]    Level of Participation: active Quality of Participation: cooperative Interactions with others: gave feedback Mood/Affect: appropriate Triggers (if applicable): none identified Cognition: coherent/clear Progress: Significant Response: Pt verbalized understanding of resources given to him on AVS prior to discharge Plan: patient will be encouraged to seek help if having cravings as relapse prevention  Patients Problems:  Patient Active Problem List   Diagnosis Date Noted   Substance induced mood disorder (HCC) 03/24/2024   Abscess of forearm, right 03/29/2021   Cellulitis of right hand 04/10/2020   Positive blood cultures 04/10/2020   IVDU (intravenous drug user)    HIV disease (HCC) 10/20/2019   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

## 2024-04-05 ENCOUNTER — Ambulatory Visit: Admitting: Pharmacist

## 2024-04-05 NOTE — Progress Notes (Deleted)
 HPI: Ethan Goodwin is a 33 y.o. male who presents to the [facility] pharmacy clinic for HIV follow-up.  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   FLUOXETINE  (PROZAC ) 10 MG CAPSULE    Take 1 capsule (10 mg total) by mouth daily.   NICOTINE  (NICODERM CQ  - DOSED IN MG/24 HOURS) 21 MG/24HR PATCH    Place 1 patch (21 mg total) onto the skin daily at 6 (six) AM.   RISPERIDONE  (RISPERDAL ) 1 MG TABLET    Take 1 tablet (1 mg total) by mouth 2 (two) times daily.   TRAZODONE  (DESYREL ) 50 MG TABLET    Take 1 tablet (50 mg total) by mouth at bedtime as needed for sleep.  Modified Medications   No medications on file  Discontinued Medications   No medications on file    Labs: Lab Results  Component Value Date   HIV1RNAQUANT 66 (H) 06/09/2023   HIV1RNAQUANT 25 (H) 10/19/2021   HIV1RNAQUANT 46,800 (H) 08/03/2021   CD4TABS 978 07/26/2020   CD4TABS 801 04/10/2020   CD4TABS 574 11/22/2019    RPR and STI Lab Results  Component Value Date   LABRPR REACTIVE (A) 06/09/2023   LABRPR REACTIVE (A) 10/19/2021   LABRPR REACTIVE (A) 08/03/2021   LABRPR Reactive (A) 04/10/2020   LABRPR REACTIVE (A) 09/21/2019   RPRTITER 1:2 (H) 06/09/2023   RPRTITER 1:32 (H) 10/19/2021   RPRTITER 1:16 (H) 08/03/2021   RPRTITER 1:32 (H) 09/21/2019    STI Results GC CT  10/05/2021  4:00 PM Negative  Negative   10/05/2021  3:59 PM Positive    Negative  Negative    Negative   07/03/2021  4:33 PM Negative  Negative   09/21/2019 12:17 PM Negative  Negative     Hepatitis  B Lab Results  Component Value Date   HEPBSAB REACTIVE (A) 08/03/2021   HEPBSAG NON-REACTIVE 08/03/2021   HEPBCAB NON-REACTIVE 08/03/2021   Hepatitis C Lab Results  Component Value Date   HEPCAB NON-REACTIVE 08/03/2021   Hepatitis A Lab Results  Component Value Date   HAV NON-REACTIVE 08/03/2021   Lipids: Lab Results  Component Value Date   CHOL 127 03/24/2024   TRIG 49 03/24/2024   HDL 54 03/24/2024   CHOLHDL 2.4 03/24/2024   VLDL 10 03/24/2024   LDLCALC 63 03/24/2024    Current HIV Regimen: Dovato   Assessment: Ethan Goodwin is here today to follow up for his HIV infection. He was last seen by Dr. Dennise in September 2024 and has missed several appointments since then. He is prescribed Dovato  and fill history report shows intermittent filling - 10/27/23; 3/725; 01/05/24; 03/08/24; and 04/03/24). Last HIV RNA was 66 in September 2024; CD4 count was 1,296 in January 2023.   He has been in and out of behavior health due to hallucinations and methamphetamine abuse. Recently was there at the end of June for meth detox. CBC and CMP during this visit were unremarkable (total bilirubin was a tad high). Cholesterol panel was also WNL.   Plan: ***  Ethan Goodwin  FREDRIK Flowers, PharmD, TYRUS BAL, CPP Clinical Pharmacist Practitioner Infectious Diseases Clinical Pharmacist [facility] 04/05/2024, 11:12 AM

## 2024-04-20 ENCOUNTER — Other Ambulatory Visit (HOSPITAL_COMMUNITY): Payer: Self-pay

## 2024-04-20 ENCOUNTER — Other Ambulatory Visit: Payer: Self-pay

## 2024-04-20 ENCOUNTER — Ambulatory Visit (INDEPENDENT_AMBULATORY_CARE_PROVIDER_SITE_OTHER): Admitting: Pharmacist

## 2024-04-20 ENCOUNTER — Other Ambulatory Visit (HOSPITAL_COMMUNITY)
Admission: RE | Admit: 2024-04-20 | Discharge: 2024-04-20 | Disposition: A | Discharge status: Home / Self Care | Source: Ambulatory Visit | Attending: Infectious Disease | Admitting: Infectious Disease

## 2024-04-20 ENCOUNTER — Telehealth: Payer: Self-pay

## 2024-04-20 DIAGNOSIS — B20 Human immunodeficiency virus [HIV] disease: Secondary | ICD-10-CM | POA: Diagnosis not present

## 2024-04-20 DIAGNOSIS — Z113 Encounter for screening for infections with a predominantly sexual mode of transmission: Secondary | ICD-10-CM

## 2024-04-20 MED ORDER — BICTEGRAVIR-EMTRICITAB-TENOFOV 50-200-25 MG PO TABS: 1.0000 | ORAL_TABLET | Freq: Every day | ORAL | 2 refills | Status: AC

## 2024-04-20 NOTE — Patient Instructions (Signed)
 [facility] [place]  - For housing help [phone]

## 2024-04-20 NOTE — Telephone Encounter (Signed)
 [facility] Patient Advocate Encounter  Insurance verification completed.    The patient is insured through [employer].     Ran test claim for BIKTARVY  The current 30 day co-pay is $0.  Ran test claim for DESCOVY The medication will need a PA.(Closed formulary)  Ran test claim for DOVATO  The current 30 day co-pay is $0. NEXT FILL 05/12/24  LAST FILL 04/19/24  We will continue to follow to see if copay assistance is needed.  This test claim was processed through [facility]- copay amounts may vary at other pharmacies due to pharmacy/plan contracts, or as the patient moves through the different stages of their insurance plan.

## 2024-04-20 NOTE — Progress Notes (Signed)
 HPI: Ethan Goodwin is a 33 y.o. male who presents to the [facility] pharmacy clinic for HIV follow-up.  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   FLUOXETINE  (PROZAC ) 10 MG CAPSULE    Take 1 capsule (10 mg total) by mouth daily.   NICOTINE  (NICODERM CQ  - DOSED IN MG/24 HOURS) 21 MG/24HR PATCH    Place 1 patch (21 mg total) onto the skin daily at 6 (six) AM.   RISPERIDONE  (RISPERDAL ) 1 MG TABLET    Take 1 tablet (1 mg total) by mouth 2 (two) times daily.   TRAZODONE  (DESYREL ) 50 MG TABLET    Take 1 tablet (50 mg total) by mouth at bedtime as needed for sleep.  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    Social History: Social History   Socioeconomic History   Marital status: Single    Spouse name: Not on file   Number of children: Not on file   Years of education: Not on file   Highest education level: Not on file  Occupational History   Not on file  Tobacco Use   Smoking status: Every Day    Types: Cigarettes   Smokeless tobacco: Never  Vaping Use   Vaping status: Never Used  Substance and Sexual Activity   Alcohol use: Not Currently   Drug use: Not Currently    Types: IV, Methamphetamines   Sexual activity: Not Currently    Comment: pt given condoms  Other Topics Concern   Not on file  Social History  Narrative   Not on file   Social Drivers of Health   Financial Resource Strain: Not on file  Food Insecurity: Food Insecurity Present (03/24/2024)   Hunger Vital Sign    Worried About Running Out of Food in the Last Year: Sometimes true    Ran Out of Food in the Last Year: Often true  Transportation Needs: Unmet Transportation Needs (03/24/2024)   PRAPARE - Administrator, Civil Service (Medical): Yes    Lack of Transportation (Non-Medical): Yes  Physical Activity: Not on file  Stress: Not on file  Social Connections: Unknown (04/11/2022)   Received from [facility]   Social Network    Social Network: Not on file    Labs: Lab Results  Component Value Date   HIV1RNAQUANT 66 (H) 06/09/2023   HIV1RNAQUANT 25 (H) 10/19/2021   HIV1RNAQUANT 46,800 (H) 08/03/2021   CD4TABS 978 07/26/2020   CD4TABS 801 04/10/2020   CD4TABS 574 11/22/2019    RPR and STI Lab Results  Component Value Date   LABRPR REACTIVE (A) 06/09/2023   LABRPR REACTIVE (A) 10/19/2021   LABRPR REACTIVE (A) 08/03/2021   LABRPR Reactive (A) 04/10/2020   LABRPR REACTIVE (A) 09/21/2019   RPRTITER 1:2 (H) 06/09/2023   RPRTITER 1:32 (H)  10/19/2021   RPRTITER 1:16 (H) 08/03/2021   RPRTITER 1:32 (H) 09/21/2019    STI Results GC CT  10/05/2021  4:00 PM Negative  Negative   10/05/2021  3:59 PM Positive    Negative  Negative    Negative   07/03/2021  4:33 PM Negative  Negative   09/21/2019 12:17 PM Negative  Negative     Hepatitis B Lab Results  Component Value Date   HEPBSAB REACTIVE (A) 08/03/2021   HEPBSAG NON-REACTIVE 08/03/2021   HEPBCAB NON-REACTIVE 08/03/2021   Hepatitis C Lab Results  Component Value Date   HEPCAB NON-REACTIVE 08/03/2021   Hepatitis A Lab Results  Component Value Date   HAV NON-REACTIVE 08/03/2021   Lipids: Lab Results  Component Value Date   CHOL 127 03/24/2024   TRIG 49 03/24/2024   HDL 54 03/24/2024   CHOLHDL 2.4 03/24/2024   VLDL 10 03/24/2024    LDLCALC 63 03/24/2024    Current HIV Regimen: Dovato   Assessment: Ethan Goodwin presents to clinic today for HIV follow-up. He has missed 8 appointments since March and last followed with Dr. Dennise in September 2024. At that time, he was incarcerated and was then released in February. He states he took Dovato  daily while incarcerated and has been fairly consistent since being out on probation. States he misses ~5 tablets per month and has not ever split Dovato  in half. His fill history is also fairly consistent (1/27, 3/7, 4/7, 6/9, and 7/21). He has not picked up his Dovato  from [facility] on [address] yet. He would like to switch to another option today - preferably Biktarvy  or Cabenuva. Explained that at this time he is not a candidate for Cabenuva given multiple missed appointments.   Will switch patient to Biktarvy . Covered through his insurance at no charge. Provided him with 2 weeks of samples today to bridge until he can fill through [facility] on [address].  Will check HIV RNA with reflex, CD4 count, and HCV antibody today. Lab unable to draw blood during appointment today due to IV drug use and no access. Rescheduled patient for lab check on 04/22/24.   Explained that Biktarvy  is a one pill once daily medication with or without food and the importance of not missing any doses. Explained resistance and how it develops and why it is so important to take Biktarvy  daily and not skip days or doses. Counseled patient to take it around the same time each day. Counseled on what to do if dose is missed, if closer to missed dose take immediately, if closer to next dose then skip and resume normal schedule. Cautioned on possible side effects the first week or so including nausea, diarrhea, dizziness, and headaches but that they should resolve after the first couple of weeks. I reviewed patient medications and found no drug interactions. Counseled patient to separate Biktarvy  from divalent cations including  multivitamins. Discussed with patient to call clinic if he starts a new medication or herbal supplement. I gave the patient my card and told him to call me with any issues/questions/concerns.  States since being on probation he has struggled with homelessness and transportation. States he walked to appointment today. Requested that he set up Medicaid transportation and request bus passes from our front staff. Also referred him to [employer] with [facility] for housing concerns.   Patient has continued injecting IV meth and denies ever using heroin (despite prior documentation). He states he last used IV meth 4 days ago. Has multiple circular scars and wounds around  both legs and on his arms from IV drug use. Both hands are somewhat swollen today. He denies skin popping. He was concerned these wounds were due to syphilis but reviewed I am not concerned about STIs at this time. Will still screen for urine/oral cytologies given he has participated in giving and receiving oral sex with male partners.    Plan: - Check HIV RNA with reflex, CD4 count, HCV antibody, RPR, and urine/oral cytologies  - STOP Dovato  - Start Biktarvy  (samples provided)  - Refer to [facility] for housing concerns  - Follow up with Dr. Dennise on 05/19/24  Alan Geralds, PharmD, CPP, BCIDP, AAHIVP Clinical Pharmacist Practitioner Infectious Diseases Clinical Pharmacist [facility] 04/20/2024, 1:39 PM

## 2024-04-21 ENCOUNTER — Other Ambulatory Visit: Payer: Self-pay | Admitting: Pharmacist

## 2024-04-21 DIAGNOSIS — B20 Human immunodeficiency virus [HIV] disease: Secondary | ICD-10-CM

## 2024-04-21 LAB — URINE CYTOLOGY ANCILLARY ONLY
Chlamydia: NEGATIVE
Comment: NEGATIVE
Comment: NORMAL
Neisseria Gonorrhea: NEGATIVE

## 2024-04-21 LAB — CYTOLOGY, (ORAL, ANAL, URETHRAL) ANCILLARY ONLY
Chlamydia: NEGATIVE
Comment: NEGATIVE
Comment: NORMAL
Neisseria Gonorrhea: NEGATIVE

## 2024-04-21 MED ORDER — BIKTARVY 50-200-25 MG PO TABS: 1.0000 | ORAL_TABLET | Freq: Every day | ORAL | Status: AC

## 2024-04-21 NOTE — Progress Notes (Signed)
 Medication Samples have been provided to the patient.  Drug name: Biktarvy         Strength: 50/200/25 mg       Qty: 2 bottles (14 tablets)   LOT:  [id number]  Exp.Date: 04/29/26  Samples requested by Alan Geralds.  Dosing instructions: Take one tablet by mouth once daily  The patient has been instructed regarding the correct time, dose, and frequency of taking this medication, including desired effects and most common side effects.   Jaidynn Balster L. Blaiden Werth, PharmD, BCIDP, AAHIVP, CPP Clinical Pharmacist Practitioner Infectious Diseases Clinical Pharmacist [facility]

## 2024-04-22 ENCOUNTER — Other Ambulatory Visit

## 2024-05-19 ENCOUNTER — Telehealth: Payer: Self-pay

## 2024-05-19 ENCOUNTER — Ambulatory Visit: Admitting: Internal Medicine

## 2024-05-19 NOTE — Telephone Encounter (Signed)
 Attempted to call patient to reschedule missed appointment. Not able to reach at this time. Left voicemail requesting call back. Lorenda CHRISTELLA Code, RMA

## 2024-06-01 ENCOUNTER — Emergency Department (HOSPITAL_COMMUNITY)
Admission: EM | Admit: 2024-06-01 | Discharge: 2024-06-01 | Discharge status: Against Medical Advice | Attending: Emergency Medicine | Admitting: Emergency Medicine

## 2024-06-01 ENCOUNTER — Other Ambulatory Visit: Payer: Self-pay

## 2024-06-01 DIAGNOSIS — Z5321 Procedure and treatment not carried out due to patient leaving prior to being seen by health care provider: Secondary | ICD-10-CM | POA: Diagnosis not present

## 2024-06-01 DIAGNOSIS — J069 Acute upper respiratory infection, unspecified: Secondary | ICD-10-CM | POA: Insufficient documentation

## 2024-06-01 DIAGNOSIS — R059 Cough, unspecified: Secondary | ICD-10-CM | POA: Diagnosis present

## 2024-06-01 LAB — RESP PANEL BY RT-PCR (RSV, FLU A&B, COVID)  RVPGX2
Influenza A by PCR: NEGATIVE
Influenza B by PCR: NEGATIVE
Resp Syncytial Virus by PCR: NEGATIVE
SARS Coronavirus 2 by RT PCR: NEGATIVE

## 2024-06-01 NOTE — ED Notes (Signed)
 Pt eloped from the department. Extensive search of the department came up fruitless.

## 2024-06-01 NOTE — ED Provider Notes (Signed)
 Patient was not seen by myself.  He was never in his bed.   Laurice Maude BROCKS, MD 06/01/24 [id number]

## 2024-06-01 NOTE — ED Triage Notes (Signed)
 Pt BIB EMS from home with 7 hx of s/s of URI (Cough and congestion with intermittent chills). Pt Aox4 and reports he has been around others with respiratory viruses but doesn't know what type of virus.

## 2024-06-21 ENCOUNTER — Emergency Department (HOSPITAL_COMMUNITY)

## 2024-06-21 ENCOUNTER — Emergency Department (HOSPITAL_COMMUNITY)
Admission: EM | Admit: 2024-06-21 | Discharge: 2024-06-21 | Discharge status: Against Medical Advice | Payer: Self-pay | Attending: Emergency Medicine | Admitting: Emergency Medicine

## 2024-06-21 ENCOUNTER — Encounter (HOSPITAL_COMMUNITY): Payer: Self-pay | Admitting: Emergency Medicine

## 2024-06-21 DIAGNOSIS — Z5329 Procedure and treatment not carried out because of patient's decision for other reasons: Secondary | ICD-10-CM | POA: Insufficient documentation

## 2024-06-21 DIAGNOSIS — Z5900 Homelessness unspecified: Secondary | ICD-10-CM | POA: Diagnosis not present

## 2024-06-21 DIAGNOSIS — Z21 Asymptomatic human immunodeficiency virus [HIV] infection status: Secondary | ICD-10-CM | POA: Diagnosis not present

## 2024-06-21 DIAGNOSIS — L03112 Cellulitis of left axilla: Secondary | ICD-10-CM | POA: Diagnosis present

## 2024-06-21 MED ORDER — AMOXICILLIN-POT CLAVULANATE 875-125 MG PO TABS: 1.0000 | ORAL_TABLET | Freq: Two times a day (BID) | ORAL | 0 refills | Status: DC

## 2024-06-21 MED ORDER — DOXYCYCLINE HYCLATE 100 MG PO CAPS: 100.0000 mg | ORAL_CAPSULE | Freq: Two times a day (BID) | ORAL | 0 refills | Status: DC

## 2024-06-21 MED ORDER — OXYCODONE-ACETAMINOPHEN 5-325 MG PO TABS
1.0000 | ORAL_TABLET | Freq: Once | ORAL | Status: AC
  Administered 2024-06-21: 1 via ORAL
  Filled 2024-06-21: qty 1

## 2024-06-21 NOTE — ED Provider Notes (Addendum)
 [place] [facility] Provider Note   CSN: [id number] Arrival date & time: 06/21/24  0134     Patient presents with: Abscess   Ethan Goodwin is a 33 y.o. male with history of IVDU, HIV poorly compliant with his Biktarvy , homelessness, who presents with pain, redness, and swelling in his left armpit x 2 days.  No associated fevers or chills, no pain elsewhere.  Patient states his last BM was Tuesday of this past week.  Last visit with infectious disease was in July 2025, prior to that it had been 10 months since his last ID visit, secondary to incarceration.  Took Dovato  while incarcerated, has been Biktarvy  since.  He continues to inject IV methamphetamine and has history of IV heroin use.  Did not have labs drawn at that visit despite orders.  Unknown last CD4.   HPI     Prior to Admission medications   Medication Sig Start Date End Date Taking? Authorizing Provider  bictegravir-emtricitabine-tenofovir AF (BIKTARVY ) 50-200-25 MG TABS tablet Take 1 tablet by mouth daily. 04/20/24   Waddell Alan PARAS, RPH-CPP  FLUoxetine  (PROZAC ) 10 MG capsule Take 1 capsule (10 mg total) by mouth daily. 03/27/24   Lenard Calin, MD  nicotine  (NICODERM CQ  - DOSED IN MG/24 HOURS) 21 mg/24hr patch Place 1 patch (21 mg total) onto the skin daily at 6 (six) AM. 03/27/24   Lenard Calin, MD  risperiDONE  (RISPERDAL ) 1 MG tablet Take 1 tablet (1 mg total) by mouth 2 (two) times daily. 03/26/24   Lenard Calin, MD  traZODone  (DESYREL ) 50 MG tablet Take 1 tablet (50 mg total) by mouth at bedtime as needed for sleep. 03/26/24   Lenard Calin, MD    Allergies: Patient has no known allergies.    Review of Systems  Skin:        Pain redness swelling in the left axilla.    Updated Vital Signs BP 132/80 (BP Location: Right Arm)   Pulse 87   Temp 99 F (37.2 C) (Oral)   Resp 15   SpO2 100%   Physical Exam Vitals and nursing note reviewed.  Constitutional:       Appearance: He is not ill-appearing or toxic-appearing.  HENT:     Head: Normocephalic and atraumatic.     Mouth/Throat:     Mouth: Mucous membranes are moist.     Pharynx: No oropharyngeal exudate or posterior oropharyngeal erythema.  Eyes:     General:        Right eye: No discharge.        Left eye: No discharge.     Conjunctiva/sclera: Conjunctivae normal.     Pupils: Pupils are equal, round, and reactive to light.  Cardiovascular:     Rate and Rhythm: Normal rate and regular rhythm.     Pulses: Normal pulses.     Heart sounds: Normal heart sounds. No murmur heard. Pulmonary:     Effort: Pulmonary effort is normal. No respiratory distress.     Breath sounds: Normal breath sounds. No wheezing or rales.  Abdominal:     General: Bowel sounds are normal. There is no distension.     Palpations: Abdomen is soft.     Tenderness: There is no abdominal tenderness. There is no guarding or rebound.  Musculoskeletal:        General: No deformity.     Cervical back: Neck supple.     Right lower leg: No edema.     Left lower leg:  No edema.  Skin:    General: Skin is warm and dry.     Capillary Refill: Capillary refill takes less than 2 seconds.         Comments: Mild soft tissue swelling of the knee without erythema, induration, or pain.  Per patient at baseline.  Neurological:     General: No focal deficit present.     Mental Status: He is alert and oriented to person, place, and time. Mental status is at baseline.  Psychiatric:        Mood and Affect: Mood normal.     (all labs ordered are listed, but only abnormal results are displayed) Labs Reviewed  CULTURE, BLOOD (ROUTINE X 2)  CULTURE, BLOOD (ROUTINE X 2)  CBC WITH DIFFERENTIAL/PLATELET  BASIC METABOLIC PANEL WITH GFR  I-STAT CG4 LACTIC ACID, ED    EKG: None  Radiology: DG Hand Complete Left Result Date: 06/21/2024 EXAM: 3 or more VIEW(S) XRAY OF THE LEFT HAND 06/21/2024 04:54:00 AM COMPARISON: None available.  CLINICAL HISTORY: Swollen, IVDU, axillary abscess. Swollen hand after IV drug use in same hand. FINDINGS: BONES AND JOINTS: No acute fracture. No focal osseous lesion. No joint dislocation. SOFT TISSUES: Mild dorsal soft tissue swelling. IMPRESSION: 1. Mild dorsal soft tissue swelling. 2. No acute osseous abnormality identified. Electronically signed by: Waddell Calk MD 06/21/2024 05:11 AM EDT RP Workstation: [id number]     .Ultrasound ED Soft Tissue  Date/Time: 06/21/2024 7:00 AM  Performed by: Bobette Pleasant SAUNDERS, PA-C Authorized by: Bobette Pleasant SAUNDERS, PA-C   Procedure details:    Indications: localization of abscess and evaluate for cellulitis     Transverse view:  Visualized   Longitudinal view:  Visualized   Images: archived   Location:    Location: axilla     Side:  Left Findings:     no abscess present    cellulitis present    Medications Ordered in the ED - No data to display                                  Medical Decision Making 33 year old male with poorly controlled HIV and IVDU presents with concern for left axillary swelling.  Was mildly tachycardic on intake, heart rate normal at time of my evaluation. No cardiac murmur on auscultation.  Cardiopulmonary exam is reassuring.  Abdominal exam is benign.  Patient with track marks over bilateralarms.  Cellulitic changes in the left axilla as above without abscess on bedside ultrasound.  Unfortunately patient is quite difficult to obtain  IV access.  Will require labs, dose of IV antibiotics in the emergency department.  Suspect patient will be able to be discharged with oral antibiotics, however disposition pending completion of workup.  Amount and/or Complexity of Data Reviewed Labs: ordered. Radiology: ordered.    Details: Plain film left hand unremarkable.   Risk Prescription drug management.   Care of this patient signed out to oncoming ED provider R. Bernis, PA-C at time of shift change. All pertinent  HPI, physical exam, and laboratory findings were discussed with them prior to my departure. Disposition of patient pending completion of workup, reevaluation, and clinical judgement of oncoming ED provider.   This chart was dictated using voice recognition software, Dragon. Despite the best efforts of this provider to proofread and correct errors, errors may still occur which can change documentation meaning.       Final diagnoses:  Cellulitis of left  axilla    ED Discharge Orders     None          Bobette Pleasant JONELLE DEVONNA 06/21/24 [id number]    Mesner, Selinda, MD 06/21/24 0659    Bobette, Pleasant JONELLE, PA-C 06/21/24 0700    Efe Fazzino, Pleasant JONELLE, PA-C 06/21/24 0700    Mesner, Selinda, MD 06/21/24 [phone]

## 2024-06-21 NOTE — ED Triage Notes (Signed)
 Pt here with c/o abscess under the left armpit , started 2 days ago , no fevers

## 2024-06-21 NOTE — Discharge Instructions (Addendum)
 You can return to the ER at any time to continue your care. Please take antibiotics as prescribed.

## 2024-06-21 NOTE — ED Notes (Signed)
 Pt refused to let IV team attempt ultrasound guided IV. Pt educated on the need of IV for lab work and antibiotics, pt still refusing IV attempt. PA Ranson notified.

## 2024-06-21 NOTE — ED Notes (Addendum)
 PA Ransom at bedside, determined capacity, educated pt on risks; including loss of limb, organ damage and death. Pt verbalized his acceptance of the risks.

## 2024-06-21 NOTE — ED Provider Notes (Signed)
  Physical Exam  BP 132/80 (BP Location: Right Arm)   Pulse 87   Temp 99 F (37.2 C) (Oral)   Resp 15   SpO2 100%   Physical Exam Vitals and nursing note reviewed.  Constitutional:      General: He is not in acute distress.    Appearance: He is not toxic-appearing.  Skin:    General: Skin is warm and dry.     Comments: Track marks present patient's bilateral upper extremities.  Compartments are soft.  Patient is neuro vastly intact distally in the left extremity.  Palpable pulses.  He has overlying erythema and warmth to the left axilla.  Feels indurated, no fluctuance appreciated.  No red streaking.  Full range of motion of the shoulder.  Neurological:     Mental Status: He is alert and oriented to person, place, and time.     Gait: Gait normal.     Procedures  Procedures  ED Course / MDM    Medical Decision Making Amount and/or Complexity of Data Reviewed Labs: ordered. Radiology: ordered.  Risk Prescription drug management.  Accepted handoff at shift change from Sponseller, PA-C. Please see prior provider note for more detail.   Briefly: Patient is 33 y.o. M presents to the ER for abscess in the right axilla. Poorly compliant to Biktarvy  with HIV, IV drug use.  Unknown last CD4 count as he did not have labs drawn at his last ID visit even though there were some ordered.  He denies numbness or tingling into the extremity.  He reports his last drug use was Tuesday.  Previous shift ultrasound of the area and showed cellulitis with no drainable abscess present.  It was difficult to obtain IV access and there were labs ordered.  Plan was for dose of IV antibiotics here and discharged home with oral antibiotics.  Patient was initially agreeable to plan however now he would like to leave.  IV team was at bedside however patient then declined services.  AGAINST MEDICAL ADVICE conversation was had with Nidia Kurk, RN present.  We discussed the nature and purpose, risks and  benefits, as well as, the alternatives of treatment. Time was given to allow the opportunity to ask questions and consider their options, and after the discussion, the patient decided to refuse the offerred treatment. The patient was informed that refusal could lead to, but was not limited to, death, permanent disability, or severe pain. If present, I asked the relatives or significant others to dissuade them without success. Prior to refusing, I determined that the patient had the capacity to make their decision and understood the consequences of that decision. After refusal, I made every reasonable opportunity to treat them to the best of my ability.  The patient was notified that they may return to the emergency department at any time for further treatment.    Will treat with Augmentin  and doxycycline  PO.     Bernis Ernst, PA-C 06/21/24 0902    Patsey Lot, MD 06/21/24 [phone]

## 2024-06-21 NOTE — ED Notes (Signed)
 Writer attempted PIV twice and was unsuccessful. Leandrew, RN and Venetia, RN attempted US  IV and were unsuccessful. IV team consulted.

## 2024-06-26 LAB — CULTURE, BLOOD (ROUTINE X 2): Culture: NO GROWTH

## 2024-07-14 ENCOUNTER — Ambulatory Visit (HOSPITAL_COMMUNITY)
Admission: EM | Admit: 2024-07-14 | Discharge: 2024-07-14 | Disposition: A | Discharge status: Other Acute Inpatient Hospital | Attending: Psychiatry | Admitting: Psychiatry

## 2024-07-14 ENCOUNTER — Emergency Department (HOSPITAL_COMMUNITY)
Admission: EM | Admit: 2024-07-14 | Discharge: 2024-07-14 | Disposition: A | Discharge status: Home / Self Care | Attending: Emergency Medicine | Admitting: Emergency Medicine

## 2024-07-14 ENCOUNTER — Ambulatory Visit (INDEPENDENT_AMBULATORY_CARE_PROVIDER_SITE_OTHER)
Admission: EM | Admit: 2024-07-14 | Discharge: 2024-07-15 | Disposition: A | Discharge status: Other Acute Inpatient Hospital | Source: Home / Self Care

## 2024-07-14 ENCOUNTER — Encounter (HOSPITAL_COMMUNITY): Payer: Self-pay

## 2024-07-14 ENCOUNTER — Other Ambulatory Visit: Payer: Self-pay

## 2024-07-14 ENCOUNTER — Encounter (HOSPITAL_COMMUNITY): Payer: Self-pay | Admitting: *Deleted

## 2024-07-14 DIAGNOSIS — F209 Schizophrenia, unspecified: Secondary | ICD-10-CM | POA: Insufficient documentation

## 2024-07-14 DIAGNOSIS — F1510 Other stimulant abuse, uncomplicated: Secondary | ICD-10-CM | POA: Insufficient documentation

## 2024-07-14 DIAGNOSIS — R5383 Other fatigue: Secondary | ICD-10-CM | POA: Diagnosis not present

## 2024-07-14 DIAGNOSIS — Z8619 Personal history of other infectious and parasitic diseases: Secondary | ICD-10-CM | POA: Insufficient documentation

## 2024-07-14 DIAGNOSIS — Z5900 Homelessness unspecified: Secondary | ICD-10-CM | POA: Insufficient documentation

## 2024-07-14 DIAGNOSIS — Z765 Malingerer [conscious simulation]: Secondary | ICD-10-CM | POA: Insufficient documentation

## 2024-07-14 DIAGNOSIS — Z653 Problems related to other legal circumstances: Secondary | ICD-10-CM | POA: Diagnosis not present

## 2024-07-14 DIAGNOSIS — Z79899 Other long term (current) drug therapy: Secondary | ICD-10-CM | POA: Insufficient documentation

## 2024-07-14 DIAGNOSIS — F1910 Other psychoactive substance abuse, uncomplicated: Secondary | ICD-10-CM

## 2024-07-14 DIAGNOSIS — F17200 Nicotine dependence, unspecified, uncomplicated: Secondary | ICD-10-CM | POA: Insufficient documentation

## 2024-07-14 DIAGNOSIS — F419 Anxiety disorder, unspecified: Secondary | ICD-10-CM | POA: Insufficient documentation

## 2024-07-14 DIAGNOSIS — F32A Depression, unspecified: Secondary | ICD-10-CM | POA: Insufficient documentation

## 2024-07-14 DIAGNOSIS — Z560 Unemployment, unspecified: Secondary | ICD-10-CM | POA: Insufficient documentation

## 2024-07-14 LAB — POCT URINE DRUG SCREEN - MANUAL ENTRY (I-SCREEN)
POC Amphetamine UR: POSITIVE — AB
POC Buprenorphine (BUP): NOT DETECTED
POC Cocaine UR: NOT DETECTED
POC Marijuana UR: NOT DETECTED
POC Methadone UR: NOT DETECTED
POC Methamphetamine UR: POSITIVE — AB
POC Morphine: NOT DETECTED
POC Oxazepam (BZO): NOT DETECTED
POC Oxycodone UR: NOT DETECTED
POC Secobarbital (BAR): NOT DETECTED

## 2024-07-14 MED ORDER — RISPERIDONE 1 MG PO TABS
1.0000 mg | ORAL_TABLET | Freq: Two times a day (BID) | ORAL | Status: DC
  Administered 2024-07-14: 1 mg via ORAL
  Filled 2024-07-14: qty 1

## 2024-07-14 MED ORDER — HALOPERIDOL 5 MG PO TABS: 5.0000 mg | ORAL_TABLET | Freq: Three times a day (TID) | ORAL | Status: DC | PRN

## 2024-07-14 MED ORDER — HALOPERIDOL LACTATE 5 MG/ML IJ SOLN: 10.0000 mg | Freq: Three times a day (TID) | INTRAMUSCULAR | Status: DC | PRN

## 2024-07-14 MED ORDER — LORAZEPAM 2 MG/ML IJ SOLN: 2.0000 mg | Freq: Three times a day (TID) | INTRAMUSCULAR | Status: DC | PRN

## 2024-07-14 MED ORDER — DIPHENHYDRAMINE HCL 50 MG PO CAPS: 50.0000 mg | ORAL_CAPSULE | Freq: Three times a day (TID) | ORAL | Status: DC | PRN

## 2024-07-14 MED ORDER — DIPHENHYDRAMINE HCL 50 MG/ML IJ SOLN: 50.0000 mg | Freq: Three times a day (TID) | INTRAMUSCULAR | Status: DC | PRN

## 2024-07-14 MED ORDER — BICTEGRAVIR-EMTRICITAB-TENOFOV 50-200-25 MG PO TABS
1.0000 | ORAL_TABLET | Freq: Every day | ORAL | Status: DC
Start: 2024-07-14 — End: 2024-07-14
  Administered 2024-07-14: 1 via ORAL
  Filled 2024-07-14: qty 1

## 2024-07-14 MED ORDER — NICOTINE 14 MG/24HR TD PT24
14.0000 mg | MEDICATED_PATCH | Freq: Every day | TRANSDERMAL | Status: DC
  Filled 2024-07-14: qty 1

## 2024-07-14 MED ORDER — HALOPERIDOL LACTATE 5 MG/ML IJ SOLN: 5.0000 mg | Freq: Three times a day (TID) | INTRAMUSCULAR | Status: DC | PRN

## 2024-07-14 MED ORDER — MAGNESIUM HYDROXIDE 400 MG/5ML PO SUSP: 30.0000 mL | Freq: Every day | ORAL | Status: DC | PRN

## 2024-07-14 MED ORDER — ACETAMINOPHEN 325 MG PO TABS
650.0000 mg | ORAL_TABLET | Freq: Four times a day (QID) | ORAL | Status: DC | PRN
  Administered 2024-07-14: 650 mg via ORAL
  Filled 2024-07-14: qty 2

## 2024-07-14 MED ORDER — NICOTINE 21 MG/24HR TD PT24: 21.0000 mg | MEDICATED_PATCH | Freq: Every day | TRANSDERMAL | Status: DC

## 2024-07-14 MED ORDER — ACETAMINOPHEN 325 MG PO TABS: 650.0000 mg | ORAL_TABLET | Freq: Four times a day (QID) | ORAL | Status: DC | PRN

## 2024-07-14 MED ORDER — ALUM & MAG HYDROXIDE-SIMETH 200-200-20 MG/5ML PO SUSP: 30.0000 mL | ORAL | Status: DC | PRN

## 2024-07-14 MED ORDER — FLUOXETINE HCL 10 MG PO CAPS
10.0000 mg | ORAL_CAPSULE | Freq: Every day | ORAL | Status: DC
  Administered 2024-07-14: 10 mg via ORAL
  Filled 2024-07-14: qty 1

## 2024-07-14 NOTE — ED Notes (Signed)
 Reported called to [facility] and given to Magazine features editor, Charity fundraiser. Safe transport contacted for transport to ED.

## 2024-07-14 NOTE — ED Notes (Signed)
Pt is awake, alert and oriented X4. Pt did not voice any complaints of pain or discomfort. No signs of acute distress noted. Administered scheduled meds per order. Pt denies current SI/HI/AVH, plan or intent. Staff will monitor for pt's safety.

## 2024-07-14 NOTE — Discharge Instructions (Addendum)
[facility]

## 2024-07-14 NOTE — ED Notes (Signed)
 Pt is asleep. Respirations are even and unlabored. No signs of acute distress noted. Staff will monitor for pt's safety.

## 2024-07-14 NOTE — ED Triage Notes (Signed)
 Pt came in via [employer] from [employer] with no escort to explain reason for visit. EMTALA sheet in the folder he has with him states he is sent here to have labs drawn to work-up the organic causes of mood symptoms and to assess medical stability.

## 2024-07-14 NOTE — ED Notes (Signed)
 Pt asking for food

## 2024-07-14 NOTE — Progress Notes (Signed)
 Pt is a hard stick. Venipuncture was tried multiple times but was unsuccessful.

## 2024-07-14 NOTE — ED Provider Notes (Signed)
 [facility] Continuous Assessment Admission H&P  Date: 07/14/24 Patient Name: Ethan Goodwin MRN: [id number] Chief Complaint: Meth use  Diagnoses:  Final diagnoses:  Homelessness unspecified  Substance abuse [facility])  Malingering    HPI: Ethan Goodwin, 33 y/o male with a history of methamphetamine abuse, auditory hallucination, MDD, homelessness.  Presented to [facility], voluntarily.  Per the patient he wants to be checked in for 3 days.  When asked what is the purpose of a 3-day he stated that his probation officer sent him over here.  Patient went on to say that he used methamphetamines 2 days ago.  When asked if he is compliant with his medication regiment patient stated he last took medicine 3 days ago because he does not live anywhere and does not have any way to keep the medication.  Patient reports that he is currently homeless and has been homeless for the past 3 years.  Last time patient was here upon discharge he was given information to follow-up with outpatient services patient stated he did not follow-up.  A face-to-face evaluation of patient, patient is alert and oriented x 4, speech is clear, maintain eye contact.  Patient does not appear to be in any immediate distress.  Patient denies SI, HI, AVH or paranoia.  Denies alcohol use.  Denies smoking.  Reported he used methamphetamines 2 days ago.  Patient denies access to guns.  Patient does not appear to be influenced by internal stimuli.  Does not seem to pose a risk to himself or others at this present moment.  Patient specifically asked to be admitted for 2 to 3 days because his probation officer sent him here.  Given patient history will admit patient for overnight observation and reassessment in the a.m.  Total Time spent with patient: 30 minutes  Musculoskeletal  Strength & Muscle Tone: within normal limits Gait & Station: normal Patient leans: N/A  Psychiatric Specialty Exam  Presentation General Appearance:   Appropriate for Environment  Eye Contact: Good  Speech: Clear and Coherent  Speech Volume: Normal  Handedness: Right   Mood and Affect  Mood: Euthymic  Affect: Congruent   Thought Process  Thought Processes: Coherent  Descriptions of Associations:Intact  Orientation:Full (Time, Place and Person)  Thought Content:WDL  Diagnosis of Schizophrenia or Schizoaffective disorder in past: No  Duration of Psychotic Symptoms: Greater than six months  Hallucinations:Hallucinations: None  Ideas of Reference:None  Suicidal Thoughts:Suicidal Thoughts: No  Homicidal Thoughts:Homicidal Thoughts: No   Sensorium  Memory: Immediate Fair  Judgment: Poor  Insight: Poor   Executive Functions  Concentration: Fair  Attention Span: Fair  Recall: Fair  Fund of Knowledge: Fair  Language: Fair   Psychomotor Activity  Psychomotor Activity: Psychomotor Activity: Normal   Assets  Assets: Desire for Improvement; Resilience; Social Support; Vocational/Educational   Sleep  Sleep: Sleep: Fair Number of Hours of Sleep: 5   Nutritional Assessment (For OBS and FBC admissions only) Has the patient had a weight loss or gain of 10 pounds or more in the last 3 months?: No Has the patient had a decrease in food intake/or appetite?: No Does the patient have dental problems?: No Does the patient have eating habits or behaviors that may be indicators of an eating disorder including binging or inducing vomiting?: No Has the patient recently lost weight without trying?: 0 Has the patient been eating poorly because of a decreased appetite?: 0 Malnutrition Screening Tool Score: 0    Physical Exam HENT:     Head: Normocephalic.  Nose: Nose normal.  Eyes:     Pupils: Pupils are equal, round, and reactive to light.  Cardiovascular:     Rate and Rhythm: Normal rate.  Pulmonary:     Effort: Pulmonary effort is normal.  Musculoskeletal:        General: Normal  range of motion.     Cervical back: Normal range of motion.  Neurological:     General: No focal deficit present.     Mental Status: He is alert.  Psychiatric:        Mood and Affect: Mood normal.        Behavior: Behavior normal.        Thought Content: Thought content normal.        Judgment: Judgment normal.    Review of Systems  Constitutional: Negative.   HENT: Negative.    Eyes: Negative.   Respiratory: Negative.    Cardiovascular: Negative.   Gastrointestinal: Negative.   Genitourinary: Negative.   Musculoskeletal: Negative.   Skin: Negative.   Neurological: Negative.   Psychiatric/Behavioral:  Positive for substance abuse. The patient is nervous/anxious.     Blood pressure 117/87, pulse 87, temperature 98.5 F (36.9 C), temperature source Oral, resp. rate 18, SpO2 100%. There is no height or weight on file to calculate BMI.  Past Psychiatric History: Polysubstance abuse, MDD, homelessness,  Is the patient at risk to self? No  Has the patient been a risk to self in the past 6 months? No .    Has the patient been a risk to self within the distant past? No   Is the patient a risk to others? No   Has the patient been a risk to others in the past 6 months? No   Has the patient been a risk to others within the distant past? No   Past Medical History: See chart  Family History: Unknown  Social History: Amphetamine  Last Labs:  Admission on 07/14/2024  Component Date Value Ref Range Status   POC Amphetamine UR 07/14/2024 Positive (A)  NONE DETECTED (Cut Off Level 1000 ng/mL) Final   POC Secobarbital (BAR) 07/14/2024 None Detected  NONE DETECTED (Cut Off Level 300 ng/mL) Final   POC Buprenorphine (BUP) 07/14/2024 None Detected  NONE DETECTED (Cut Off Level 10 ng/mL) Final   POC Oxazepam (BZO) 07/14/2024 None Detected  NONE DETECTED (Cut Off Level 300 ng/mL) Final   POC Cocaine UR 07/14/2024 None Detected  NONE DETECTED (Cut Off Level 300 ng/mL) Final   POC  Methamphetamine UR 07/14/2024 Positive (A)  NONE DETECTED (Cut Off Level 1000 ng/mL) Final   POC Morphine  07/14/2024 None Detected  NONE DETECTED (Cut Off Level 300 ng/mL) Final   POC Methadone UR 07/14/2024 None Detected  NONE DETECTED (Cut Off Level 300 ng/mL) Final   POC Oxycodone  UR 07/14/2024 None Detected  NONE DETECTED (Cut Off Level 100 ng/mL) Final   POC Marijuana UR 07/14/2024 None Detected  NONE DETECTED (Cut Off Level 50 ng/mL) Final  Admission on 06/21/2024, Discharged on 06/21/2024  Component Date Value Ref Range Status   Specimen Description 06/21/2024 BLOOD LEFT ANTECUBITAL   Final   Special Requests 06/21/2024 Blood Culture results may not be optimal due to an inadequate volume of blood received in culture bottles BOTTLES DRAWN AEROBIC ONLY   Final   Culture 06/21/2024    Final                   Value:NO GROWTH 5 DAYS Performed at [facility]  [facility], [address], [place], [place] [address]    Report Status 06/21/2024 06/26/2024 FINAL   Final  Admission on 06/01/2024, Discharged on 06/01/2024  Component Date Value Ref Range Status   SARS Coronavirus 2 by RT PCR 06/01/2024 NEGATIVE  NEGATIVE Final   Influenza A by PCR 06/01/2024 NEGATIVE  NEGATIVE Final   Influenza B by PCR 06/01/2024 NEGATIVE  NEGATIVE Final   Comment: (NOTE) The Xpert Xpress SARS-CoV-2/FLU/RSV plus assay is intended as an aid in the diagnosis of influenza from Nasopharyngeal swab specimens and should not be used as a sole basis for treatment. Nasal washings and aspirates are unacceptable for Xpert Xpress SARS-CoV-2/FLU/RSV testing.  Fact Sheet for Patients: BloggerCourse.com  Fact Sheet for Healthcare Providers: SeriousBroker.it  This test is not yet approved or cleared by the [place]  FDA and has been authorized for detection and/or diagnosis of SARS-CoV-2 by FDA under an Emergency Use Authorization (EUA). This EUA will remain in effect  (meaning this test can be used) for the duration of the COVID-19 declaration under Section 564(b)(1) of the Act, 21 U.S.C. section 360bbb-3(b)(1), unless the authorization is terminated or revoked.     Resp Syncytial Virus by PCR 06/01/2024 NEGATIVE  NEGATIVE Final   Comment: (NOTE) Fact Sheet for Patients: BloggerCourse.com  Fact Sheet for Healthcare Providers: SeriousBroker.it  This test is not yet approved or cleared by the [place]  FDA and has been authorized for detection and/or diagnosis of SARS-CoV-2 by FDA under an Emergency Use Authorization (EUA). This EUA will remain in effect (meaning this test can be used) for the duration of the COVID-19 declaration under Section 564(b)(1) of the Act, 21 U.S.C. section 360bbb-3(b)(1), unless the authorization is terminated or revoked.  Performed at [facility], [address], [place], [place] [address]   Office Visit on 04/20/2024  Component Date Value Ref Range Status   Neisseria Gonorrhea 04/20/2024 Negative   Final   Chlamydia 04/20/2024 Negative   Final   Comment 04/20/2024 Normal Reference Ranger Chlamydia - Negative   Final   Comment 04/20/2024 Normal Reference Range Neisseria Gonorrhea - Negative   Final   Neisseria Gonorrhea 04/20/2024 Negative   Final   Chlamydia 04/20/2024 Negative   Final   Comment 04/20/2024 Normal Reference Ranger Chlamydia - Negative   Final   Comment 04/20/2024 Normal Reference Range Neisseria Gonorrhea - Negative   Final  Admission on 03/24/2024, Discharged on 03/27/2024  Component Date Value Ref Range Status   WBC 03/24/2024 6.3  4.0 - 10.5 K/uL Final   RBC 03/24/2024 4.37  4.22 - 5.81 MIL/uL Final   Hemoglobin 03/24/2024 13.8  13.0 - 17.0 g/dL Final   HCT 93/74/7974 41.9  39.0 - 52.0 % Final   MCV 03/24/2024 95.9  80.0 - 100.0 fL Final   MCH 03/24/2024 31.6  26.0 - 34.0 pg Final   MCHC 03/24/2024 32.9  30.0 - 36.0 g/dL Final   RDW  93/74/7974 13.1  11.5 - 15.5 % Final   Platelets 03/24/2024 201  150 - 400 K/uL Final   nRBC 03/24/2024 0.0  0.0 - 0.2 % Final   Neutrophils Relative % 03/24/2024 58  % Final   Neutro Abs 03/24/2024 3.6  1.7 - 7.7 K/uL Final   Lymphocytes Relative 03/24/2024 33  % Final   Lymphs Abs 03/24/2024 2.1  0.7 - 4.0 K/uL Final   Monocytes Relative 03/24/2024 7  % Final   Monocytes Absolute 03/24/2024 0.4  0.1 - 1.0 K/uL Final  Eosinophils Relative 03/24/2024 1  % Final   Eosinophils Absolute 03/24/2024 0.1  0.0 - 0.5 K/uL Final   Basophils Relative 03/24/2024 0  % Final   Basophils Absolute 03/24/2024 0.0  0.0 - 0.1 K/uL Final   Immature Granulocytes 03/24/2024 1  % Final   Abs Immature Granulocytes 03/24/2024 0.06  0.00 - 0.07 K/uL Final   Performed at [facility], [address], [place], [place] [address]   Sodium 03/24/2024 139  135 - 145 mmol/L Final   Potassium 03/24/2024 3.8  3.5 - 5.1 mmol/L Final   Chloride 03/24/2024 107  98 - 111 mmol/L Final   CO2 03/24/2024 23  22 - 32 mmol/L Final   Glucose, Bld 03/24/2024 100 (H)  70 - 99 mg/dL Final   Glucose reference range applies only to samples taken after fasting for at least 8 hours.   BUN 03/24/2024 16  6 - 20 mg/dL Final   Creatinine, Ser 03/24/2024 0.98  0.61 - 1.24 mg/dL Final   Calcium 93/74/7974 9.3  8.9 - 10.3 mg/dL Final   Total Protein 93/74/7974 7.9  6.5 - 8.1 g/dL Final   Albumin 93/74/7974 3.7  3.5 - 5.0 g/dL Final   AST 93/74/7974 23  15 - 41 U/L Final   ALT 03/24/2024 19  0 - 44 U/L Final   Alkaline Phosphatase 03/24/2024 87  38 - 126 U/L Final   Total Bilirubin 03/24/2024 1.4 (H)  0.0 - 1.2 mg/dL Final   GFR, Estimated 03/24/2024 >60  >60 mL/min Final   Comment: (NOTE) Calculated using the CKD-EPI Creatinine Equation (2021)    Anion gap 03/24/2024 9  5 - 15 Final   Performed at [facility], [address] [place], [place] [address]   Hgb A1c MFr Bld 03/24/2024 5.3  4.8 - 5.6 % Final   Comment:  (NOTE) Diagnosis of Diabetes The following HbA1c ranges recommended by the American Diabetes Association (ADA) may be used as an aid in the diagnosis of diabetes mellitus.  Hemoglobin             Suggested A1C NGSP%              Diagnosis  <5.7                   Non Diabetic  5.7-6.4                Pre-Diabetic  >6.4                   Diabetic  <7.0                   Glycemic control for                       adults with diabetes.     Mean Plasma Glucose 03/24/2024 105.41  mg/dL Final   Performed at [facility], [address] [place], [place] [address]   Alcohol, Ethyl (B) 03/24/2024 <15  <15 mg/dL Final   Comment: (NOTE) For medical purposes only. Performed at [facility], [address] [place], [place] [address]    Cholesterol 03/24/2024 127  0 - 200 mg/dL Final   Triglycerides 93/74/7974 49  <150 mg/dL Final   HDL 93/74/7974 54  >40 mg/dL Final   Total CHOL/HDL Ratio 03/24/2024 2.4  RATIO Final   VLDL 03/24/2024 10  0 - 40 mg/dL Final  LDL Cholesterol 03/24/2024 63  0 - 99 mg/dL Final   Comment:        Total Cholesterol/HDL:CHD Risk Coronary Heart Disease Risk Table                     Men   Women  1/2 Average Risk   3.4   3.3  Average Risk       5.0   4.4  2 X Average Risk   9.6   7.1  3 X Average Risk  23.4   11.0        Use the calculated Patient Ratio above and the CHD Risk Table to determine the patient's CHD Risk.        ATP III CLASSIFICATION (LDL):  <100     mg/dL   Optimal  899-870  mg/dL   Near or Above                    Optimal  130-159  mg/dL   Borderline  839-810  mg/dL   High  >809     mg/dL   Very High Performed at [facility], [address] [place], [place] [address]    TSH 03/24/2024 1.305  0.350 - 4.500 uIU/mL Final   Comment: Performed by a 3rd Generation assay with a functional sensitivity of <=0.01 uIU/mL. Performed at [facility], [address] [place], [place] [address]    Color, Urine  03/24/2024 AMBER (A)  YELLOW Final   BIOCHEMICALS MAY BE AFFECTED BY COLOR   APPearance 03/24/2024 CLEAR  CLEAR Final   Specific Gravity, Urine 03/24/2024 1.030  1.005 - 1.030 Final   pH 03/24/2024 5.0  5.0 - 8.0 Final   Glucose, UA 03/24/2024 NEGATIVE  NEGATIVE mg/dL Final   Hgb urine dipstick 03/24/2024 NEGATIVE  NEGATIVE Final   Bilirubin Urine 03/24/2024 NEGATIVE  NEGATIVE Final   Ketones, ur 03/24/2024 NEGATIVE  NEGATIVE mg/dL Final   Protein, ur 93/74/7974 NEGATIVE  NEGATIVE mg/dL Final   Nitrite 93/74/7974 NEGATIVE  NEGATIVE Final   Leukocytes,Ua 03/24/2024 NEGATIVE  NEGATIVE Final   Performed at [facility], [address] [place], [place] [address]   POC Amphetamine UR 03/24/2024 Positive (A)  NONE DETECTED (Cut Off Level 1000 ng/mL) Final   POC Secobarbital (BAR) 03/24/2024 None Detected  NONE DETECTED (Cut Off Level 300 ng/mL) Final   POC Buprenorphine (BUP) 03/24/2024 None Detected  NONE DETECTED (Cut Off Level 10 ng/mL) Final   POC Oxazepam (BZO) 03/24/2024 None Detected  NONE DETECTED (Cut Off Level 300 ng/mL) Final   POC Cocaine UR 03/24/2024 None Detected  NONE DETECTED (Cut Off Level 300 ng/mL) Final   POC Methamphetamine UR 03/24/2024 Positive (A)  NONE DETECTED (Cut Off Level 1000 ng/mL) Final   POC Morphine  03/24/2024 None Detected  NONE DETECTED (Cut Off Level 300 ng/mL) Final   POC Methadone UR 03/24/2024 None Detected  NONE DETECTED (Cut Off Level 300 ng/mL) Final   POC Oxycodone  UR 03/24/2024 None Detected  NONE DETECTED (Cut Off Level 100 ng/mL) Final   POC Marijuana UR 03/24/2024 None Detected  NONE DETECTED (Cut Off Level 50 ng/mL) Final    Allergies: Patient has no known allergies.  Medications:  Facility Ordered Medications  Medication   acetaminophen  (TYLENOL ) tablet 650 mg   alum & mag hydroxide-simeth (MAALOX/MYLANTA) 200-200-20 MG/5ML suspension 30 mL   magnesium  hydroxide (MILK OF MAGNESIA) suspension 30 mL   haloperidol  (HALDOL ) tablet 5  mg    And   diphenhydrAMINE  (BENADRYL ) capsule 50 mg   haloperidol  lactate (HALDOL ) injection 5 mg   And   diphenhydrAMINE  (BENADRYL ) injection 50 mg   And   LORazepam  (ATIVAN ) injection 2 mg   haloperidol  lactate (HALDOL ) injection 10 mg   And   diphenhydrAMINE  (BENADRYL ) injection 50 mg   And   LORazepam  (ATIVAN ) injection 2 mg   nicotine  (NICODERM CQ  - dosed in mg/24 hours) patch 14 mg   PTA Medications  Medication Sig   FLUoxetine  (PROZAC ) 10 MG capsule Take 1 capsule (10 mg total) by mouth daily.   nicotine  (NICODERM CQ  - DOSED IN MG/24 HOURS) 21 mg/24hr patch Place 1 patch (21 mg total) onto the skin daily at 6 (six) AM.   risperiDONE  (RISPERDAL ) 1 MG tablet Take 1 tablet (1 mg total) by mouth 2 (two) times daily.   traZODone  (DESYREL ) 50 MG tablet Take 1 tablet (50 mg total) by mouth at bedtime as needed for sleep.   bictegravir-emtricitabine-tenofovir AF (BIKTARVY ) 50-200-25 MG TABS tablet Take 1 tablet by mouth daily.   amoxicillin -clavulanate (AUGMENTIN ) 875-125 MG tablet Take 1 tablet by mouth every 12 (twelve) hours.   doxycycline  (VIBRAMYCIN ) 100 MG capsule Take 1 capsule (100 mg total) by mouth 2 (two) times daily.      Medical Decision Making  Observation unit    Recommendations  Based on my evaluation the patient does not appear to have an emergency medical condition.  Gaither Pouch, NP 07/14/24  4:18 AM

## 2024-07-14 NOTE — Progress Notes (Signed)
   07/14/24 0125  [facility] Triage Screening (Walk-ins at [facility] only)  How Did You Hear About Us ? Self  What Is the Reason for Your Visit/Call Today? Pt presents to [facility] as a voluntary walk-in, unaccompanied requesting substance abuse treatment and shelter. Pt reports experiencing homelessness and has hx of methamphtamine use. Pt reports using Meth over the weekend, but has only had marijuana within the last 24 hours. Pt reports diagnosis of anxiety. Pt was prescribed Prozac  and Risperdal  during his last admission to [facility], but has not followed up with medications since his discharge. Pt reports being established with [facility] for outpatient therapy, but has not began sessions at this time. Pt currently denies SI,HI,AVH and alcohol use.  How Long Has This Been Causing You Problems? > than 6 months  Have You Recently Had Any Thoughts About Hurting Yourself? No  Are You Planning to Commit Suicide/Harm Yourself At This time? No  Have you Recently Had Thoughts About Hurting Someone Sherral? No  Are You Planning To Harm Someone At This Time? No  Physical Abuse Denies  Verbal Abuse Denies  Sexual Abuse Denies  Exploitation of patient/patient's resources Denies  Self-Neglect Yes, present (Comment)  Are you currently experiencing any auditory, visual or other hallucinations? No  Have You Used Any Alcohol or Drugs in the Past 24 Hours? Yes  What Did You Use and How Much? marijuana  Do you have any current medical co-morbidities that require immediate attention? No  Clinician description of patient physical appearance/behavior: cooperative, calm, casually dressed  What Do You Feel Would Help You the Most Today? Alcohol or Drug Use Treatment;Social Support;Housing Assistance  If access to [facility] was not available, would you have sought care in the Emergency Department? Yes  Determination of Need Urgent (48 hours)  Options For Referral Other: Comment;Chemical Dependency Intensive  Outpatient Therapy (CDIOP);Facility-Based Crisis;Medication Management;Outpatient Therapy  Determination of Need filed? Yes

## 2024-07-14 NOTE — ED Provider Notes (Signed)
 [place] EMERGENCY DEPARTMENT AT [facility] Provider Note   CSN: [id number] Arrival date & time: 07/14/24  1553     Patient presents with: No chief complaint on file.   Christpher Stogsdill is a 33 y.o. male.   Patient sent from [facility] for medical clearance. He has a history of schizophrenia and is not feeling like his symptoms are controlled. No SI/HI. He reports he is tired and needs some rest. No physical complaints of pain, illness, nausea.         Prior to Admission medications   Medication Sig Start Date End Date Taking? Authorizing Provider  bictegravir-emtricitabine-tenofovir AF (BIKTARVY ) 50-200-25 MG TABS tablet Take 1 tablet by mouth daily. 04/20/24   Waddell Alan PARAS, RPH-CPP  FLUoxetine  (PROZAC ) 10 MG capsule Take 1 capsule (10 mg total) by mouth daily. 03/27/24   Lenard Calin, MD  nicotine  (NICODERM CQ  - DOSED IN MG/24 HOURS) 21 mg/24hr patch Place 1 patch (21 mg total) onto the skin daily at 6 (six) AM. Patient not taking: Reported on 07/14/2024 03/27/24   Lenard Calin, MD  risperiDONE  (RISPERDAL ) 1 MG tablet Take 1 tablet (1 mg total) by mouth 2 (two) times daily. 03/26/24   Lenard Calin, MD  traZODone  (DESYREL ) 50 MG tablet Take 1 tablet (50 mg total) by mouth at bedtime as needed for sleep. Patient not taking: Reported on 07/14/2024 03/26/24   Lenard Calin, MD    Allergies: Patient has no known allergies.    Review of Systems  Updated Vital Signs BP 117/81 (BP Location: Right Arm)   Pulse 90   Temp 97.8 F (36.6 C) (Oral)   Resp 16   Ht 5' 7 (1.702 m)   Wt 64 kg   SpO2 100%   BMI 22.10 kg/m   Physical Exam Constitutional:      Appearance: He is well-developed.  HENT:     Head: Normocephalic.  Cardiovascular:     Rate and Rhythm: Normal rate and regular rhythm.     Heart sounds: No murmur heard. Pulmonary:     Effort: Pulmonary effort is normal.     Breath sounds: Normal breath sounds. No wheezing, rhonchi or rales.  Abdominal:      General: Bowel sounds are normal.     Palpations: Abdomen is soft.     Tenderness: There is no abdominal tenderness. There is no guarding or rebound.  Musculoskeletal:        General: Normal range of motion.     Cervical back: Normal range of motion and neck supple.  Skin:    General: Skin is warm and dry.  Neurological:     General: No focal deficit present.     Mental Status: He is alert and oriented to person, place, and time.  Psychiatric:     Comments: Appears fatigued but is cooperative and calm.      (all labs ordered are listed, but only abnormal results are displayed) Labs Reviewed  CBC WITH DIFFERENTIAL/PLATELET  COMPREHENSIVE METABOLIC PANEL WITH GFR  ETHANOL  RAPID URINE DRUG SCREEN, HOSP PERFORMED  SALICYLATE LEVEL  ACETAMINOPHEN  LEVEL    EKG: None  Radiology: No results found.   Procedures   Medications Ordered in the ED - No data to display  Clinical Course as of 07/14/24 1935  Wed Jul 14, 2024  1638 Sent from [facility] by safe transport for unclear reasons. Patient states I need rest, history of schizophrenia, unsure if he is stable. Medical clearance labs obtained. Will move to  psych for further evaluation.  [SU]  1932 Patient has been sleeping, quiet, for the most part cooperative. Multiple attempts to obtain labs are unsuccessful. He now refuses any further attempts. He continues to deny SI/HI/AVH. I talked to him about not being able to evaluate him further without labs and he only states he wants to sleep. He has been given a meal. Feel he is stable for discharge. Encouraged to follow up with [facility] as needed.  [SU]    Clinical Course User Index [SU] Odell Balls, PA-C                                 Medical Decision Making Amount and/or Complexity of Data Reviewed Labs: ordered.        Final diagnoses:  Other fatigue  Homelessness    ED Discharge Orders     None          Odell Balls, DEVONNA 07/14/24 WINDELL Geraldene Hamilton, MD 07/15/24 1534

## 2024-07-14 NOTE — ED Notes (Signed)
 Per [facility], attempt x2 to get blood work from pt. Attempts unsuccessful. Pt has may track marks on his arms and scar tissue from them. Will pass this information on to day shift staff.

## 2024-07-14 NOTE — ED Provider Notes (Signed)
 [facility] Continuous Assessment Admission H&P  Date: 07/15/24 Patient Name: Ethan Goodwin MRN: [id number] Chief Complaint: cocaine abuse  Diagnoses:  Final diagnoses:  Methamphetamine abuse [facility])    HPI: Ethan Goodwin, 33 y/o male with a history of methamphetamine abuse, auditory hallucination, MDD, homelessness.  Patient was sent out to the ED to have blood drawn however they were unsuccessful in collecting the blood.  Patient was eventually discharged in however patient should have returned back but patient walked back to the facility.  Prior admission notes:  Ethan Goodwin, 33 y/o male with a history of methamphetamine abuse, auditory hallucination, MDD, homelessness.  Presented to [facility], voluntarily.  Per the patient he wants to be checked in for 3 days.  When asked what is the purpose of a 3-day he stated that his probation officer sent him over here.  Patient went on to say that he used methamphetamines 2 days ago.  When asked if he is compliant with his medication regiment patient stated he last took medicine 3 days ago because he does not live anywhere and does not have any way to keep the medication.  Patient reports that he is currently homeless and has been homeless for the past 3 years.  Last time patient was here upon discharge he was given information to follow-up with outpatient services patient stated he did not follow-up.   A face-to-face evaluation of patient, patient is alert and oriented x 4, speech is clear, maintain eye contact.  Patient does not appear to be in any immediate distress.  Patient denies SI, HI, AVH or paranoia.  Denies alcohol use.  Denies smoking.  Reported he used methamphetamines 2 days ago.  Patient denies access to guns.  Patient does not appear to be influenced by internal stimuli.  Does not seem to pose a risk to himself or others at this present moment.  Patient specifically asked to be admitted for 2 to 3 days because his probation officer  sent him here   Face-to-face observation of patient, patient is alert and oriented x 4, speech is clear, maintain eye contact.  Patient does not show any sign of distress does not show any sign of being influenced by internal stimuli.  Patient denies SI, HI, AVH or paranoia.  Patient will be readmitted back to the observation unit.   Total Time spent with patient: 20 minutes  Musculoskeletal  Strength & Muscle Tone: within normal limits Gait & Station: normal Patient leans: N/A  Psychiatric Specialty Exam  Presentation General Appearance:  Appropriate for Environment  Eye Contact: Good  Speech: Clear and Coherent  Speech Volume: Normal  Handedness: Right   Mood and Affect  Mood: Euphoric  Affect: Congruent   Thought Process  Thought Processes: Coherent  Descriptions of Associations:Intact  Orientation:Full (Time, Place and Person)  Thought Content:WDL  Diagnosis of Schizophrenia or Schizoaffective disorder in past: No  Duration of Psychotic Symptoms: Greater than six months  Hallucinations:Hallucinations: None  Ideas of Reference:None  Suicidal Thoughts:Suicidal Thoughts: No  Homicidal Thoughts:Homicidal Thoughts: No   Sensorium  Memory: Immediate Fair  Judgment: Poor  Insight: Poor   Executive Functions  Concentration: Fair  Attention Span: Fair  Recall: Fair  Fund of Knowledge: Fair  Language: Fair   Psychomotor Activity  Psychomotor Activity: Psychomotor Activity: Normal   Assets  Assets: Desire for Improvement   Sleep  Sleep: Sleep: Fair Number of Hours of Sleep: 5   Nutritional Assessment (For OBS and FBC admissions only) Has the patient had  a weight loss or gain of 10 pounds or more in the last 3 months?: No Has the patient had a decrease in food intake/or appetite?: No Does the patient have dental problems?: No Does the patient have eating habits or behaviors that may be indicators of an eating disorder  including binging or inducing vomiting?: No Has the patient recently lost weight without trying?: 0 Has the patient been eating poorly because of a decreased appetite?: 0 Malnutrition Screening Tool Score: 0    Physical Exam HENT:     Head: Normocephalic.     Nose: Nose normal.  Eyes:     Pupils: Pupils are equal, round, and reactive to light.  Cardiovascular:     Rate and Rhythm: Normal rate.  Pulmonary:     Effort: Pulmonary effort is normal.  Musculoskeletal:        General: Normal range of motion.     Cervical back: Normal range of motion.  Neurological:     General: No focal deficit present.     Mental Status: He is alert.  Psychiatric:        Mood and Affect: Mood normal.        Behavior: Behavior normal.        Thought Content: Thought content normal.    Review of Systems  Constitutional: Negative.   HENT: Negative.    Eyes: Negative.   Respiratory: Negative.    Cardiovascular: Negative.   Gastrointestinal: Negative.   Genitourinary: Negative.   Musculoskeletal: Negative.   Skin: Negative.   Neurological: Negative.   Psychiatric/Behavioral:  Positive for substance abuse. The patient is nervous/anxious.     There were no vitals taken for this visit. There is no height or weight on file to calculate BMI.  Past Psychiatric History: Amphetamine abuse, homelessness, MDD  Is the patient at risk to self? No  Has the patient been a risk to self in the past 6 months? No .    Has the patient been a risk to self within the distant past? No   Is the patient a risk to others? No   Has the patient been a risk to others in the past 6 months? No   Has the patient been a risk to others within the distant past? No   Past Medical History: See chart  Family History: Unknown  Social History: Meth abuse cocaine abuse  Last Labs:  Admission on 07/14/2024, Discharged on 07/14/2024  Component Date Value Ref Range Status   POC Amphetamine UR 07/14/2024 Positive (A)  NONE  DETECTED (Cut Off Level 1000 ng/mL) Final   POC Secobarbital (BAR) 07/14/2024 None Detected  NONE DETECTED (Cut Off Level 300 ng/mL) Final   POC Buprenorphine (BUP) 07/14/2024 None Detected  NONE DETECTED (Cut Off Level 10 ng/mL) Final   POC Oxazepam (BZO) 07/14/2024 None Detected  NONE DETECTED (Cut Off Level 300 ng/mL) Final   POC Cocaine UR 07/14/2024 None Detected  NONE DETECTED (Cut Off Level 300 ng/mL) Final   POC Methamphetamine UR 07/14/2024 Positive (A)  NONE DETECTED (Cut Off Level 1000 ng/mL) Final   POC Morphine  07/14/2024 None Detected  NONE DETECTED (Cut Off Level 300 ng/mL) Final   POC Methadone UR 07/14/2024 None Detected  NONE DETECTED (Cut Off Level 300 ng/mL) Final   POC Oxycodone  UR 07/14/2024 None Detected  NONE DETECTED (Cut Off Level 100 ng/mL) Final   POC Marijuana UR 07/14/2024 None Detected  NONE DETECTED (Cut Off Level 50 ng/mL) Final  Admission on 06/21/2024,  Discharged on 06/21/2024  Component Date Value Ref Range Status   Specimen Description 06/21/2024 BLOOD LEFT ANTECUBITAL   Final   Special Requests 06/21/2024 Blood Culture results may not be optimal due to an inadequate volume of blood received in culture bottles BOTTLES DRAWN AEROBIC ONLY   Final   Culture 06/21/2024    Final                   Value:NO GROWTH 5 DAYS Performed at [facility], [address], [place], [place] [address]    Report Status 06/21/2024 06/26/2024 FINAL   Final  Admission on 06/01/2024, Discharged on 06/01/2024  Component Date Value Ref Range Status   SARS Coronavirus 2 by RT PCR 06/01/2024 NEGATIVE  NEGATIVE Final   Influenza A by PCR 06/01/2024 NEGATIVE  NEGATIVE Final   Influenza B by PCR 06/01/2024 NEGATIVE  NEGATIVE Final   Comment: (NOTE) The Xpert Xpress SARS-CoV-2/FLU/RSV plus assay is intended as an aid in the diagnosis of influenza from Nasopharyngeal swab specimens and should not be used as a sole basis for treatment. Nasal washings and aspirates are  unacceptable for Xpert Xpress SARS-CoV-2/FLU/RSV testing.  Fact Sheet for Patients: BloggerCourse.com  Fact Sheet for Healthcare Providers: SeriousBroker.it  This test is not yet approved or cleared by the [place]  FDA and has been authorized for detection and/or diagnosis of SARS-CoV-2 by FDA under an Emergency Use Authorization (EUA). This EUA will remain in effect (meaning this test can be used) for the duration of the COVID-19 declaration under Section 564(b)(1) of the Act, 21 U.S.C. section 360bbb-3(b)(1), unless the authorization is terminated or revoked.     Resp Syncytial Virus by PCR 06/01/2024 NEGATIVE  NEGATIVE Final   Comment: (NOTE) Fact Sheet for Patients: BloggerCourse.com  Fact Sheet for Healthcare Providers: SeriousBroker.it  This test is not yet approved or cleared by the [place]  FDA and has been authorized for detection and/or diagnosis of SARS-CoV-2 by FDA under an Emergency Use Authorization (EUA). This EUA will remain in effect (meaning this test can be used) for the duration of the COVID-19 declaration under Section 564(b)(1) of the Act, 21 U.S.C. section 360bbb-3(b)(1), unless the authorization is terminated or revoked.  Performed at [facility], [address], [place], [place] [address]   Office Visit on 04/20/2024  Component Date Value Ref Range Status   Neisseria Gonorrhea 04/20/2024 Negative   Final   Chlamydia 04/20/2024 Negative   Final   Comment 04/20/2024 Normal Reference Ranger Chlamydia - Negative   Final   Comment 04/20/2024 Normal Reference Range Neisseria Gonorrhea - Negative   Final   Neisseria Gonorrhea 04/20/2024 Negative   Final   Chlamydia 04/20/2024 Negative   Final   Comment 04/20/2024 Normal Reference Ranger Chlamydia - Negative   Final   Comment 04/20/2024 Normal Reference Range Neisseria Gonorrhea - Negative    Final  Admission on 03/24/2024, Discharged on 03/27/2024  Component Date Value Ref Range Status   WBC 03/24/2024 6.3  4.0 - 10.5 K/uL Final   RBC 03/24/2024 4.37  4.22 - 5.81 MIL/uL Final   Hemoglobin 03/24/2024 13.8  13.0 - 17.0 g/dL Final   HCT 93/74/7974 41.9  39.0 - 52.0 % Final   MCV 03/24/2024 95.9  80.0 - 100.0 fL Final   MCH 03/24/2024 31.6  26.0 - 34.0 pg Final   MCHC 03/24/2024 32.9  30.0 - 36.0 g/dL Final   RDW 93/74/7974 13.1  11.5 - 15.5 % Final  Platelets 03/24/2024 201  150 - 400 K/uL Final   nRBC 03/24/2024 0.0  0.0 - 0.2 % Final   Neutrophils Relative % 03/24/2024 58  % Final   Neutro Abs 03/24/2024 3.6  1.7 - 7.7 K/uL Final   Lymphocytes Relative 03/24/2024 33  % Final   Lymphs Abs 03/24/2024 2.1  0.7 - 4.0 K/uL Final   Monocytes Relative 03/24/2024 7  % Final   Monocytes Absolute 03/24/2024 0.4  0.1 - 1.0 K/uL Final   Eosinophils Relative 03/24/2024 1  % Final   Eosinophils Absolute 03/24/2024 0.1  0.0 - 0.5 K/uL Final   Basophils Relative 03/24/2024 0  % Final   Basophils Absolute 03/24/2024 0.0  0.0 - 0.1 K/uL Final   Immature Granulocytes 03/24/2024 1  % Final   Abs Immature Granulocytes 03/24/2024 0.06  0.00 - 0.07 K/uL Final   Performed at [facility], [address] [place], [place] [address]   Sodium 03/24/2024 139  135 - 145 mmol/L Final   Potassium 03/24/2024 3.8  3.5 - 5.1 mmol/L Final   Chloride 03/24/2024 107  98 - 111 mmol/L Final   CO2 03/24/2024 23  22 - 32 mmol/L Final   Glucose, Bld 03/24/2024 100 (H)  70 - 99 mg/dL Final   Glucose reference range applies only to samples taken after fasting for at least 8 hours.   BUN 03/24/2024 16  6 - 20 mg/dL Final   Creatinine, Ser 03/24/2024 0.98  0.61 - 1.24 mg/dL Final   Calcium 93/74/7974 9.3  8.9 - 10.3 mg/dL Final   Total Protein 93/74/7974 7.9  6.5 - 8.1 g/dL Final   Albumin 93/74/7974 3.7  3.5 - 5.0 g/dL Final   AST 93/74/7974 23  15 - 41 U/L Final   ALT 03/24/2024 19  0 - 44 U/L Final    Alkaline Phosphatase 03/24/2024 87  38 - 126 U/L Final   Total Bilirubin 03/24/2024 1.4 (H)  0.0 - 1.2 mg/dL Final   GFR, Estimated 03/24/2024 >60  >60 mL/min Final   Comment: (NOTE) Calculated using the CKD-EPI Creatinine Equation (2021)    Anion gap 03/24/2024 9  5 - 15 Final   Performed at [facility], [address] [place], [place] [address]   Hgb A1c MFr Bld 03/24/2024 5.3  4.8 - 5.6 % Final   Comment: (NOTE) Diagnosis of Diabetes The following HbA1c ranges recommended by the American Diabetes Association (ADA) may be used as an aid in the diagnosis of diabetes mellitus.  Hemoglobin             Suggested A1C NGSP%              Diagnosis  <5.7                   Non Diabetic  5.7-6.4                Pre-Diabetic  >6.4                   Diabetic  <7.0                   Glycemic control for                       adults with diabetes.     Mean Plasma Glucose 03/24/2024 105.41  mg/dL Final   Performed at [facility], [address] [place], [place]  [address]   Alcohol, Ethyl (B) 03/24/2024 <15  <15 mg/dL Final   Comment: (NOTE) For medical purposes only. Performed at [facility], [address] [place], [place] [address]    Cholesterol 03/24/2024 127  0 - 200 mg/dL Final   Triglycerides 93/74/7974 49  <150 mg/dL Final   HDL 93/74/7974 54  >40 mg/dL Final   Total CHOL/HDL Ratio 03/24/2024 2.4  RATIO Final   VLDL 03/24/2024 10  0 - 40 mg/dL Final   LDL Cholesterol 03/24/2024 63  0 - 99 mg/dL Final   Comment:        Total Cholesterol/HDL:CHD Risk Coronary Heart Disease Risk Table                     Men   Women  1/2 Average Risk   3.4   3.3  Average Risk       5.0   4.4  2 X Average Risk   9.6   7.1  3 X Average Risk  23.4   11.0        Use the calculated Patient Ratio above and the CHD Risk Table to determine the patient's CHD Risk.        ATP III CLASSIFICATION (LDL):  <100     mg/dL   Optimal  899-870  mg/dL   Near or Above                     Optimal  130-159  mg/dL   Borderline  839-810  mg/dL   High  >809     mg/dL   Very High Performed at [facility], [address] [place], [place] [address]    TSH 03/24/2024 1.305  0.350 - 4.500 uIU/mL Final   Comment: Performed by a 3rd Generation assay with a functional sensitivity of <=0.01 uIU/mL. Performed at [facility], [address] [place], [place] [address]    Color, Urine 03/24/2024 AMBER (A)  YELLOW Final   BIOCHEMICALS MAY BE AFFECTED BY COLOR   APPearance 03/24/2024 CLEAR  CLEAR Final   Specific Gravity, Urine 03/24/2024 1.030  1.005 - 1.030 Final   pH 03/24/2024 5.0  5.0 - 8.0 Final   Glucose, UA 03/24/2024 NEGATIVE  NEGATIVE mg/dL Final   Hgb urine dipstick 03/24/2024 NEGATIVE  NEGATIVE Final   Bilirubin Urine 03/24/2024 NEGATIVE  NEGATIVE Final   Ketones, ur 03/24/2024 NEGATIVE  NEGATIVE mg/dL Final   Protein, ur 93/74/7974 NEGATIVE  NEGATIVE mg/dL Final   Nitrite 93/74/7974 NEGATIVE  NEGATIVE Final   Leukocytes,Ua 03/24/2024 NEGATIVE  NEGATIVE Final   Performed at [facility], [address], [place], [place] [address]   POC Amphetamine UR 03/24/2024 Positive (A)  NONE DETECTED (Cut Off Level 1000 ng/mL) Final   POC Secobarbital (BAR) 03/24/2024 None Detected  NONE DETECTED (Cut Off Level 300 ng/mL) Final   POC Buprenorphine (BUP) 03/24/2024 None Detected  NONE DETECTED (Cut Off Level 10 ng/mL) Final   POC Oxazepam (BZO) 03/24/2024 None Detected  NONE DETECTED (Cut Off Level 300 ng/mL) Final   POC Cocaine UR 03/24/2024 None Detected  NONE DETECTED (Cut Off Level 300 ng/mL) Final   POC Methamphetamine UR 03/24/2024 Positive (A)  NONE DETECTED (Cut Off Level 1000 ng/mL) Final   POC Morphine  03/24/2024 None Detected  NONE DETECTED (Cut Off Level 300 ng/mL) Final   POC Methadone UR 03/24/2024 None Detected  NONE DETECTED (Cut Off Level 300 ng/mL) Final  POC Oxycodone  UR 03/24/2024 None Detected  NONE DETECTED (Cut Off Level 100 ng/mL) Final    POC Marijuana UR 03/24/2024 None Detected  NONE DETECTED (Cut Off Level 50 ng/mL) Final    Allergies: Patient has no known allergies.  Medications:  Facility Ordered Medications  Medication   acetaminophen  (TYLENOL ) tablet 650 mg   alum & mag hydroxide-simeth (MAALOX/MYLANTA) 200-200-20 MG/5ML suspension 30 mL   magnesium  hydroxide (MILK OF MAGNESIA) suspension 30 mL   haloperidol  (HALDOL ) tablet 5 mg   And   diphenhydrAMINE  (BENADRYL ) capsule 50 mg   haloperidol  lactate (HALDOL ) injection 5 mg   And   diphenhydrAMINE  (BENADRYL ) injection 50 mg   And   LORazepam  (ATIVAN ) injection 2 mg   haloperidol  lactate (HALDOL ) injection 10 mg   And   diphenhydrAMINE  (BENADRYL ) injection 50 mg   And   LORazepam  (ATIVAN ) injection 2 mg   nicotine  (NICODERM CQ  - dosed in mg/24 hours) patch 14 mg   PTA Medications  Medication Sig   FLUoxetine  (PROZAC ) 10 MG capsule Take 1 capsule (10 mg total) by mouth daily.   nicotine  (NICODERM CQ  - DOSED IN MG/24 HOURS) 21 mg/24hr patch Place 1 patch (21 mg total) onto the skin daily at 6 (six) AM. (Patient not taking: Reported on 07/14/2024)   risperiDONE  (RISPERDAL ) 1 MG tablet Take 1 tablet (1 mg total) by mouth 2 (two) times daily.   traZODone  (DESYREL ) 50 MG tablet Take 1 tablet (50 mg total) by mouth at bedtime as needed for sleep. (Patient not taking: Reported on 07/14/2024)   bictegravir-emtricitabine-tenofovir AF (BIKTARVY ) 50-200-25 MG TABS tablet Take 1 tablet by mouth daily.      Medical Decision Making  Observation unit    Recommendations  Based on my evaluation the patient does not appear to have an emergency medical condition.  Gaither Pouch, NP 07/15/24  4:24 AM

## 2024-07-14 NOTE — Progress Notes (Signed)
 Pt transferred to [facility] for venipuncture the  to [facility] upon return. Pt is transported via safe transport.

## 2024-07-14 NOTE — ED Notes (Signed)
 This phlebotomist attempted to collect labs. Asked patient if I could take a look and see if we could find anything patient stated I've been stuck so many times so no you can't look triage RN made aware

## 2024-07-14 NOTE — ED Notes (Signed)
 Unable to obtain labs, Pt is a hard stick, Pt had 4 previous stick today prior to coming here and Pt said they couldn't get labs either
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
## 2024-07-14 NOTE — ED Provider Notes (Deleted)
 Facility Based Crisis Admission H&P  Date: 07/14/24 Patient Name: Ethan Goodwin MRN: [id number] Chief Complaint: methamphetamine detox  Diagnoses:  Final diagnoses:  Homelessness unspecified  Substance abuse (HCC)  Methamphetamine abuse (HCC)    HPI: Ethan Goodwin is a 33 year old male with history of MDD, methamphetamine use disorder, homelessness, and historical diagnosis of schizophrenia who presented to [facility] with desire to initiate detox from methamphetamines. He reports using about 1 g of methamphetamines about 3-4 days ago. He was supposed to have an appointment with his probation officer, but has rescheduled it for 10/24 and would like to initiate detox from methamphetamines before this appointment.   On interview this morning, patient relays feeling more calm from the anxiety that he felt yesterday. He slept about 6 hours and reports this as being okay with adequate appetite in this time as well. He has not had symptoms of methamphetamine withdrawal, but has noticed feeling subjectively more hot. He denies, SI, HI, and AVH. He reports he was previously prescribed Risperdal  and Prozac  but has not taken these medications in about a month. He would be interested in starting these medications again.   PHQ 2-9:  Flowsheet Row ED from 03/24/2024 in [facility] Office Visit from 03/29/2021 in [facility] [place]  Thoughts that you would be better off dead, or of hurting yourself in some way Not at all Not at all  PHQ-9 Total Score 0 4    Flowsheet Row ED from 07/14/2024 in [facility] ED from 06/01/2024 in [facility] Emergency Department at [facility] ED from 03/24/2024 in [facility]  C-SSRS RISK CATEGORY No Risk No Risk No Risk      Total Time spent with patient: 15 minutes  Musculoskeletal  Strength & Muscle Tone: within normal  limits Gait & Station: not assessed Patient leans: N/A  Psychiatric Specialty Exam  Presentation General Appearance:  Appropriate for Environment  Eye Contact: Good  Speech: Clear and Coherent  Speech Volume: Normal  Handedness: Right   Mood and Affect  Mood: Euthymic  Affect: Congruent   Thought Process  Thought Processes: Coherent  Descriptions of Associations:Intact  Orientation:Full (Time, Place and Person)  Thought Content:WDL  Diagnosis of Schizophrenia or Schizoaffective disorder in past: No  Duration of Psychotic Symptoms: Greater than six months  Hallucinations:Hallucinations: None  Ideas of Reference:None  Suicidal Thoughts:Suicidal Thoughts: No  Homicidal Thoughts:Homicidal Thoughts: No   Sensorium  Memory: Immediate Fair  Judgment: Poor  Insight: Poor   Executive Functions  Concentration: Fair  Attention Span: Fair  Recall: Fair  Fund of Knowledge: Fair  Language: Fair   Psychomotor Activity  Psychomotor Activity: Psychomotor Activity: Normal   Assets  Assets: Desire for Improvement; Resilience; Social Support; Vocational/Educational   Sleep  Sleep: Sleep: Fair Number of Hours of Sleep: 5   Nutritional Assessment (For OBS and FBC admissions only) Has the patient had a weight loss or gain of 10 pounds or more in the last 3 months?: No Has the patient had a decrease in food intake/or appetite?: No Does the patient have dental problems?: No Does the patient have eating habits or behaviors that may be indicators of an eating disorder including binging or inducing vomiting?: No Has the patient recently lost weight without trying?: 0 Has the patient been eating poorly because of a decreased appetite?: 0 Malnutrition Screening Tool Score: 0  Physical Exam HENT:     Head: Normocephalic and atraumatic.  Pulmonary:     Effort: Pulmonary effort is normal.  Skin:    Comments: Multiple track marks with  circumferential scarring appreciated on bilateral arms and legs  Neurological:     Mental Status: He is alert.  Psychiatric:        Behavior: Behavior normal.        Thought Content: Thought content normal.    Review of Systems  Constitutional:        Patient reports feeling subjectively hot  Neurological:  Negative for tremors.  Psychiatric/Behavioral:  Positive for substance abuse. Negative for hallucinations and suicidal ideas.     Blood pressure 117/82, pulse 84, temperature 97.6 F (36.4 C), temperature source Oral, resp. rate 18, SpO2 96%. There is no height or weight on file to calculate BMI.  Past Psychiatric History: Historical diagnosis of schizphrenia, MDD   Is the patient at risk to self? No  Has the patient been a risk to self in the past 6 months? No .    Has the patient been a risk to self within the distant past? Yes   Is the patient a risk to others? No   Has the patient been a risk to others in the past 6 months? No   Has the patient been a risk to others within the distant past? No   Past Medical History: HIV Family History: none disclosed Social History: none disclosed  Last Labs:  Admission on 07/14/2024  Component Date Value Ref Range Status   POC Amphetamine UR 07/14/2024 Positive (A)  NONE DETECTED (Cut Off Level 1000 ng/mL) Final   POC Secobarbital (BAR) 07/14/2024 None Detected  NONE DETECTED (Cut Off Level 300 ng/mL) Final   POC Buprenorphine (BUP) 07/14/2024 None Detected  NONE DETECTED (Cut Off Level 10 ng/mL) Final   POC Oxazepam (BZO) 07/14/2024 None Detected  NONE DETECTED (Cut Off Level 300 ng/mL) Final   POC Cocaine UR 07/14/2024 None Detected  NONE DETECTED (Cut Off Level 300 ng/mL) Final   POC Methamphetamine UR 07/14/2024 Positive (A)  NONE DETECTED (Cut Off Level 1000 ng/mL) Final   POC Morphine  07/14/2024 None Detected  NONE DETECTED (Cut Off Level 300 ng/mL) Final   POC Methadone UR 07/14/2024 None Detected  NONE DETECTED (Cut Off Level  300 ng/mL) Final   POC Oxycodone  UR 07/14/2024 None Detected  NONE DETECTED (Cut Off Level 100 ng/mL) Final   POC Marijuana UR 07/14/2024 None Detected  NONE DETECTED (Cut Off Level 50 ng/mL) Final  Admission on 06/21/2024, Discharged on 06/21/2024  Component Date Value Ref Range Status   Specimen Description 06/21/2024 BLOOD LEFT ANTECUBITAL   Final   Special Requests 06/21/2024 Blood Culture results may not be optimal due to an inadequate volume of blood received in culture bottles BOTTLES DRAWN AEROBIC ONLY   Final   Culture 06/21/2024    Final                   Value:NO GROWTH 5 DAYS Performed at [facility], [address], [place], [place] [address]    Report Status 06/21/2024 06/26/2024 FINAL   Final  Admission on 06/01/2024, Discharged on 06/01/2024  Component Date Value Ref Range Status   SARS Coronavirus 2 by RT PCR 06/01/2024 NEGATIVE  NEGATIVE Final   Influenza A by PCR 06/01/2024 NEGATIVE  NEGATIVE Final   Influenza B by PCR 06/01/2024 NEGATIVE  NEGATIVE Final   Comment: (NOTE)  The Xpert Xpress SARS-CoV-2/FLU/RSV plus assay is intended as an aid in the diagnosis of influenza from Nasopharyngeal swab specimens and should not be used as a sole basis for treatment. Nasal washings and aspirates are unacceptable for Xpert Xpress SARS-CoV-2/FLU/RSV testing.  Fact Sheet for Patients: BloggerCourse.com  Fact Sheet for Healthcare Providers: SeriousBroker.it  This test is not yet approved or cleared by the [place]  FDA and has been authorized for detection and/or diagnosis of SARS-CoV-2 by FDA under an Emergency Use Authorization (EUA). This EUA will remain in effect (meaning this test can be used) for the duration of the COVID-19 declaration under Section 564(b)(1) of the Act, 21 U.S.C. section 360bbb-3(b)(1), unless the authorization is terminated or revoked.     Resp Syncytial Virus by PCR 06/01/2024 NEGATIVE   NEGATIVE Final   Comment: (NOTE) Fact Sheet for Patients: BloggerCourse.com  Fact Sheet for Healthcare Providers: SeriousBroker.it  This test is not yet approved or cleared by the [place]  FDA and has been authorized for detection and/or diagnosis of SARS-CoV-2 by FDA under an Emergency Use Authorization (EUA). This EUA will remain in effect (meaning this test can be used) for the duration of the COVID-19 declaration under Section 564(b)(1) of the Act, 21 U.S.C. section 360bbb-3(b)(1), unless the authorization is terminated or revoked.  Performed at [facility], [address], [place], [place] [address]   Office Visit on 04/20/2024  Component Date Value Ref Range Status   Neisseria Gonorrhea 04/20/2024 Negative   Final   Chlamydia 04/20/2024 Negative   Final   Comment 04/20/2024 Normal Reference Ranger Chlamydia - Negative   Final   Comment 04/20/2024 Normal Reference Range Neisseria Gonorrhea - Negative   Final   Neisseria Gonorrhea 04/20/2024 Negative   Final   Chlamydia 04/20/2024 Negative   Final   Comment 04/20/2024 Normal Reference Ranger Chlamydia - Negative   Final   Comment 04/20/2024 Normal Reference Range Neisseria Gonorrhea - Negative   Final  Admission on 03/24/2024, Discharged on 03/27/2024  Component Date Value Ref Range Status   WBC 03/24/2024 6.3  4.0 - 10.5 K/uL Final   RBC 03/24/2024 4.37  4.22 - 5.81 MIL/uL Final   Hemoglobin 03/24/2024 13.8  13.0 - 17.0 g/dL Final   HCT 93/74/7974 41.9  39.0 - 52.0 % Final   MCV 03/24/2024 95.9  80.0 - 100.0 fL Final   MCH 03/24/2024 31.6  26.0 - 34.0 pg Final   MCHC 03/24/2024 32.9  30.0 - 36.0 g/dL Final   RDW 93/74/7974 13.1  11.5 - 15.5 % Final   Platelets 03/24/2024 201  150 - 400 K/uL Final   nRBC 03/24/2024 0.0  0.0 - 0.2 % Final   Neutrophils Relative % 03/24/2024 58  % Final   Neutro Abs 03/24/2024 3.6  1.7 - 7.7 K/uL Final   Lymphocytes Relative  03/24/2024 33  % Final   Lymphs Abs 03/24/2024 2.1  0.7 - 4.0 K/uL Final   Monocytes Relative 03/24/2024 7  % Final   Monocytes Absolute 03/24/2024 0.4  0.1 - 1.0 K/uL Final   Eosinophils Relative 03/24/2024 1  % Final   Eosinophils Absolute 03/24/2024 0.1  0.0 - 0.5 K/uL Final   Basophils Relative 03/24/2024 0  % Final   Basophils Absolute 03/24/2024 0.0  0.0 - 0.1 K/uL Final   Immature Granulocytes 03/24/2024 1  % Final   Abs Immature Granulocytes 03/24/2024 0.06  0.00 - 0.07 K/uL Final   Performed at [facility], [address] [place], [place] [address]   Sodium 03/24/2024 139  135 - 145 mmol/L Final   Potassium 03/24/2024 3.8  3.5 - 5.1 mmol/L Final   Chloride 03/24/2024 107  98 - 111 mmol/L Final   CO2 03/24/2024 23  22 - 32 mmol/L Final   Glucose, Bld 03/24/2024 100 (H)  70 - 99 mg/dL Final   Glucose reference range applies only to samples taken after fasting for at least 8 hours.   BUN 03/24/2024 16  6 - 20 mg/dL Final   Creatinine, Ser 03/24/2024 0.98  0.61 - 1.24 mg/dL Final   Calcium 93/74/7974 9.3  8.9 - 10.3 mg/dL Final   Total Protein 93/74/7974 7.9  6.5 - 8.1 g/dL Final   Albumin 93/74/7974 3.7  3.5 - 5.0 g/dL Final   AST 93/74/7974 23  15 - 41 U/L Final   ALT 03/24/2024 19  0 - 44 U/L Final   Alkaline Phosphatase 03/24/2024 87  38 - 126 U/L Final   Total Bilirubin 03/24/2024 1.4 (H)  0.0 - 1.2 mg/dL Final   GFR, Estimated 03/24/2024 >60  >60 mL/min Final   Comment: (NOTE) Calculated using the CKD-EPI Creatinine Equation (2021)    Anion gap 03/24/2024 9  5 - 15 Final   Performed at [facility], [address] [place], [place] [address]   Hgb A1c MFr Bld 03/24/2024 5.3  4.8 - 5.6 % Final   Comment: (NOTE) Diagnosis of Diabetes The following HbA1c ranges recommended by the American Diabetes Association (ADA) may be used as an aid in the diagnosis of diabetes mellitus.  Hemoglobin             Suggested A1C NGSP%              Diagnosis  <5.7                    Non Diabetic  5.7-6.4                Pre-Diabetic  >6.4                   Diabetic  <7.0                   Glycemic control for                       adults with diabetes.     Mean Plasma Glucose 03/24/2024 105.41  mg/dL Final   Performed at [facility], [address], [place], [place] [address]   Alcohol, Ethyl (B) 03/24/2024 <15  <15 mg/dL Final   Comment: (NOTE) For medical purposes only. Performed at [facility], [address] [place], [place] [address]    Cholesterol 03/24/2024 127  0 - 200 mg/dL Final   Triglycerides 93/74/7974 49  <150 mg/dL Final   HDL 93/74/7974 54  >40 mg/dL Final   Total CHOL/HDL Ratio 03/24/2024 2.4  RATIO Final   VLDL 03/24/2024 10  0 - 40 mg/dL Final   LDL Cholesterol 03/24/2024 63  0 - 99 mg/dL Final   Comment:        Total Cholesterol/HDL:CHD Risk Coronary Heart Disease Risk Table                     Men   Women  1/2 Average Risk   3.4   3.3  Average Risk       5.0  4.4  2 X Average Risk   9.6   7.1  3 X Average Risk  23.4   11.0        Use the calculated Patient Ratio above and the CHD Risk Table to determine the patient's CHD Risk.        ATP III CLASSIFICATION (LDL):  <100     mg/dL   Optimal  899-870  mg/dL   Near or Above                    Optimal  130-159  mg/dL   Borderline  839-810  mg/dL   High  >809     mg/dL   Very High Performed at [facility], [address], [place], [place] [address]    TSH 03/24/2024 1.305  0.350 - 4.500 uIU/mL Final   Comment: Performed by a 3rd Generation assay with a functional sensitivity of <=0.01 uIU/mL. Performed at [facility], [address], [place], [place] [address]    Color, Urine 03/24/2024 AMBER (A)  YELLOW Final   BIOCHEMICALS MAY BE AFFECTED BY COLOR   APPearance 03/24/2024 CLEAR  CLEAR Final   Specific Gravity, Urine 03/24/2024 1.030  1.005 - 1.030 Final   pH 03/24/2024 5.0  5.0 - 8.0 Final   Glucose, UA 03/24/2024 NEGATIVE  NEGATIVE  mg/dL Final   Hgb urine dipstick 03/24/2024 NEGATIVE  NEGATIVE Final   Bilirubin Urine 03/24/2024 NEGATIVE  NEGATIVE Final   Ketones, ur 03/24/2024 NEGATIVE  NEGATIVE mg/dL Final   Protein, ur 93/74/7974 NEGATIVE  NEGATIVE mg/dL Final   Nitrite 93/74/7974 NEGATIVE  NEGATIVE Final   Leukocytes,Ua 03/24/2024 NEGATIVE  NEGATIVE Final   Performed at [facility], [address] [place], [place] [address]   POC Amphetamine UR 03/24/2024 Positive (A)  NONE DETECTED (Cut Off Level 1000 ng/mL) Final   POC Secobarbital (BAR) 03/24/2024 None Detected  NONE DETECTED (Cut Off Level 300 ng/mL) Final   POC Buprenorphine (BUP) 03/24/2024 None Detected  NONE DETECTED (Cut Off Level 10 ng/mL) Final   POC Oxazepam (BZO) 03/24/2024 None Detected  NONE DETECTED (Cut Off Level 300 ng/mL) Final   POC Cocaine UR 03/24/2024 None Detected  NONE DETECTED (Cut Off Level 300 ng/mL) Final   POC Methamphetamine UR 03/24/2024 Positive (A)  NONE DETECTED (Cut Off Level 1000 ng/mL) Final   POC Morphine  03/24/2024 None Detected  NONE DETECTED (Cut Off Level 300 ng/mL) Final   POC Methadone UR 03/24/2024 None Detected  NONE DETECTED (Cut Off Level 300 ng/mL) Final   POC Oxycodone  UR 03/24/2024 None Detected  NONE DETECTED (Cut Off Level 100 ng/mL) Final   POC Marijuana UR 03/24/2024 None Detected  NONE DETECTED (Cut Off Level 50 ng/mL) Final    Allergies: Patient has no known allergies.  Medications:  Facility Ordered Medications  Medication   acetaminophen  (TYLENOL ) tablet 650 mg   alum & mag hydroxide-simeth (MAALOX/MYLANTA) 200-200-20 MG/5ML suspension 30 mL   magnesium  hydroxide (MILK OF MAGNESIA) suspension 30 mL   haloperidol  (HALDOL ) tablet 5 mg   And   diphenhydrAMINE  (BENADRYL ) capsule 50 mg   haloperidol  lactate (HALDOL ) injection 5 mg   And   diphenhydrAMINE  (BENADRYL ) injection 50 mg   And   LORazepam  (ATIVAN ) injection 2 mg   haloperidol  lactate (HALDOL ) injection 10 mg   And   diphenhydrAMINE   (BENADRYL ) injection 50 mg   And   LORazepam  (ATIVAN ) injection 2 mg   bictegravir-emtricitabine-tenofovir AF (BIKTARVY ) 50-200-25  MG per tablet 1 tablet   FLUoxetine  (PROZAC ) capsule 10 mg   risperiDONE  (RISPERDAL ) tablet 1 mg   [START ON 07/15/2024] nicotine  (NICODERM CQ  - dosed in mg/24 hours) patch 21 mg   PTA Medications  Medication Sig   FLUoxetine  (PROZAC ) 10 MG capsule Take 1 capsule (10 mg total) by mouth daily.   nicotine  (NICODERM CQ  - DOSED IN MG/24 HOURS) 21 mg/24hr patch Place 1 patch (21 mg total) onto the skin daily at 6 (six) AM.   risperiDONE  (RISPERDAL ) 1 MG tablet Take 1 tablet (1 mg total) by mouth 2 (two) times daily.   traZODone  (DESYREL ) 50 MG tablet Take 1 tablet (50 mg total) by mouth at bedtime as needed for sleep.   bictegravir-emtricitabine-tenofovir AF (BIKTARVY ) 50-200-25 MG TABS tablet Take 1 tablet by mouth daily.    Long Term Goals: Improvement in symptoms so as ready for discharge  Short Term Goals: Patient will verbalize feelings in meetings with treatment team members. and Patient will take medications as prescribed daily.  Medical Decision Making  Dyrell Tuccillo is a 33 year old male with history of MDD, methamphetamine use disorder, homelessness, and historical diagnosis of schizophrenia who presented to [facility] with desire to initiate detox from methamphetamines. He reports using about 1 g of methamphetamines about 3-4 days ago. He was supposed to have an appointment with his probation officer, but has rescheduled it for 10/24 and would like to initiate detox from methamphetamines before this appointment.   Based on patient current status of having no SI, HI, or AVH and interest in initiating detox, will recommend further treatment through facility based crisis. Patient will also be restarted on psychotropic medication regimen that was previously effective for him, Prozac  10mg  and Risperdal  1mg  BID.     Recommendations  Based on my evaluation the  patient does not appear to have an emergency medical condition.  -- Continue home Prozac  10 mg for anxiety/depression -- Continue Risperdal  1 mg twice daily for prior history of schizophrenia -- Nicotine  patch 21 mg for nicotine  dependence As needed agitation medications ordered and additional supportive meds for symptomatic treatment  Brad Prey, Medical Student 07/14/24  10:17 AM  ------ I was around for the interview, plan, and participated in the discussion of this patient's care. Documentation was assisted with the help of the medical student and I made edits as needed.  We discussed him with our supervising attending physician, who is also aware of the plan.  Patti Olden, MD [place], psychiatry residency PGY 2

## 2024-07-14 NOTE — ED Notes (Signed)
 Pt awake and eating at this hour. No apparent distress. RR even and unlabored. Pt denies SI, HI, AVH. Rates pain 7/10 in bilateral feet. Pt states he is here to rest for a couple of days and get something to eat. Pt is homeless. Monitored for safety.

## 2024-07-14 NOTE — ED Notes (Signed)
 The pa has discharged him from this  ed

## 2024-07-14 NOTE — Discharge Instructions (Signed)
 Hopefully you will be able to get some rest. Please follow up with [facility] as needed.
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
## 2024-07-15 ENCOUNTER — Other Ambulatory Visit (HOSPITAL_COMMUNITY)
Admission: EM | Admit: 2024-07-15 | Discharge: 2024-07-17 | Disposition: A | Discharge status: Other Acute Inpatient Hospital

## 2024-07-15 DIAGNOSIS — F1510 Other stimulant abuse, uncomplicated: Secondary | ICD-10-CM | POA: Diagnosis not present

## 2024-07-15 MED ORDER — ACETAMINOPHEN 325 MG PO TABS: 650.0000 mg | ORAL_TABLET | Freq: Four times a day (QID) | ORAL | Status: DC | PRN

## 2024-07-15 MED ORDER — BICTEGRAVIR-EMTRICITAB-TENOFOV 50-200-25 MG PO TABS
1.0000 | ORAL_TABLET | Freq: Every day | ORAL | Status: DC
  Administered 2024-07-16 – 2024-07-17 (×2): 1 via ORAL
  Filled 2024-07-15: qty 7
  Filled 2024-07-15: qty 1
  Filled 2024-07-15: qty 7
  Filled 2024-07-15: qty 1

## 2024-07-15 MED ORDER — TRAZODONE HCL 50 MG PO TABS
50.0000 mg | ORAL_TABLET | Freq: Every evening | ORAL | Status: DC | PRN
Start: 2024-07-15 — End: 2024-07-15

## 2024-07-15 MED ORDER — DIPHENHYDRAMINE HCL 50 MG PO CAPS: 50.0000 mg | ORAL_CAPSULE | Freq: Three times a day (TID) | ORAL | Status: DC | PRN

## 2024-07-15 MED ORDER — RISPERIDONE 1 MG PO TABS
1.0000 mg | ORAL_TABLET | Freq: Two times a day (BID) | ORAL | Status: DC
  Administered 2024-07-15 – 2024-07-17 (×4): 1 mg via ORAL
  Filled 2024-07-15: qty 1
  Filled 2024-07-15: qty 14
  Filled 2024-07-15 (×3): qty 1

## 2024-07-15 MED ORDER — DIPHENHYDRAMINE HCL 50 MG/ML IJ SOLN: 50.0000 mg | Freq: Three times a day (TID) | INTRAMUSCULAR | Status: DC | PRN

## 2024-07-15 MED ORDER — MAGNESIUM HYDROXIDE 400 MG/5ML PO SUSP: 30.0000 mL | Freq: Every day | ORAL | Status: DC | PRN

## 2024-07-15 MED ORDER — TRAZODONE HCL 50 MG PO TABS
50.0000 mg | ORAL_TABLET | Freq: Every evening | ORAL | Status: DC | PRN
  Administered 2024-07-15 – 2024-07-16 (×2): 50 mg via ORAL
  Filled 2024-07-15 (×2): qty 1
  Filled 2024-07-15: qty 7

## 2024-07-15 MED ORDER — NICOTINE 21 MG/24HR TD PT24: 21.0000 mg | MEDICATED_PATCH | Freq: Every day | TRANSDERMAL | Status: DC

## 2024-07-15 MED ORDER — HALOPERIDOL 5 MG PO TABS: 5.0000 mg | ORAL_TABLET | Freq: Three times a day (TID) | ORAL | Status: DC | PRN

## 2024-07-15 MED ORDER — NICOTINE 21 MG/24HR TD PT24
21.0000 mg | MEDICATED_PATCH | TRANSDERMAL | Status: DC | PRN
  Administered 2024-07-17: 21 mg via TRANSDERMAL
  Filled 2024-07-15: qty 7
  Filled 2024-07-15: qty 1

## 2024-07-15 MED ORDER — HALOPERIDOL LACTATE 5 MG/ML IJ SOLN: 5.0000 mg | Freq: Three times a day (TID) | INTRAMUSCULAR | Status: DC | PRN

## 2024-07-15 MED ORDER — RISPERIDONE 1 MG PO TABS: 1.0000 mg | ORAL_TABLET | Freq: Two times a day (BID) | ORAL | Status: DC

## 2024-07-15 MED ORDER — FLUOXETINE HCL 10 MG PO CAPS
10.0000 mg | ORAL_CAPSULE | Freq: Every day | ORAL | Status: DC
  Administered 2024-07-15: 10 mg via ORAL
  Filled 2024-07-15: qty 1

## 2024-07-15 MED ORDER — FLUOXETINE HCL 10 MG PO CAPS
10.0000 mg | ORAL_CAPSULE | Freq: Every day | ORAL | Status: DC
  Administered 2024-07-16 – 2024-07-17 (×2): 10 mg via ORAL
  Filled 2024-07-15 (×2): qty 1

## 2024-07-15 MED ORDER — ALUM & MAG HYDROXIDE-SIMETH 200-200-20 MG/5ML PO SUSP: 30.0000 mL | ORAL | Status: DC | PRN

## 2024-07-15 MED ORDER — RISPERIDONE 1 MG PO TABS
1.0000 mg | ORAL_TABLET | Freq: Two times a day (BID) | ORAL | Status: DC
  Administered 2024-07-15: 1 mg via ORAL
  Filled 2024-07-15: qty 1

## 2024-07-15 MED ORDER — BICTEGRAVIR-EMTRICITAB-TENOFOV 50-200-25 MG PO TABS
1.0000 | ORAL_TABLET | Freq: Every day | ORAL | Status: DC
  Administered 2024-07-15: 1 via ORAL
  Filled 2024-07-15: qty 1

## 2024-07-15 MED ORDER — FLUOXETINE HCL 10 MG PO CAPS: 10.0000 mg | ORAL_CAPSULE | Freq: Every day | ORAL | Status: DC

## 2024-07-15 MED ORDER — HYDROXYZINE HCL 25 MG PO TABS
25.0000 mg | ORAL_TABLET | Freq: Three times a day (TID) | ORAL | Status: DC | PRN
  Filled 2024-07-15: qty 10

## 2024-07-15 MED ORDER — LORAZEPAM 2 MG/ML IJ SOLN: 2.0000 mg | Freq: Three times a day (TID) | INTRAMUSCULAR | Status: DC | PRN

## 2024-07-15 NOTE — ED Notes (Addendum)
 Pt came form room angry annoyed, stating he was hungry and wants to leave facility. Provider notified. Staff informed pt that dinner was in about half hour and offered snack. Pt refused, began to escalate, walked done hallway and knocked cleaning cart. Pieces/ items that fell of of cart were picked up by security who had come to unit. Pt currently calmly eating dinner on couches near nursing station. No apparent injury or complaint of pain.

## 2024-07-15 NOTE — Group Note (Signed)
 Group Topic: Wellness  Group Date: 07/15/2024 Start Time: 1200 End Time: 1230 Facilitators: Daved Tinnie HERO, RN  Department: [facility]  Number of Participants: 4  Group Focus: nursing group Treatment Modality:  Psychoeducation Interventions utilized were patient education Purpose: increase insight  Name: Ethan Goodwin Date of Birth: 12-18-1990  MR: [id number]    Level of Participation: pt was not admitted to unit at time of RN education group Plan: patient will be encouraged to attend future RN education group and discuss medications with RN and providers  Patients Problems:  Patient Active Problem List   Diagnosis Date Noted   Methamphetamine abuse (HCC) 07/15/2024   Substance induced mood disorder (HCC) 03/24/2024   Abscess of forearm, right 03/29/2021   Cellulitis of right hand 04/10/2020   Positive blood cultures 04/10/2020   IVDU (intravenous drug user)    HIV disease (HCC) 10/20/2019   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

## 2024-07-15 NOTE — Discharge Instructions (Signed)
FBC

## 2024-07-15 NOTE — ED Notes (Signed)
 Pt currently asleep in bed, no signs of distress observed. No complaint or concerns reported to RN thus far.

## 2024-07-15 NOTE — ED Notes (Signed)
 Patient woke up irritable grabbing at nurses station door demanding to leave. Patient would respond to questions with I wanna go Im ready to go while pulling at door. Provider notified and spoke with patient. Patient able to be de-escalated and redirected. Patient compliant with morning medications but refused his nicotine  patch. Patient is currently resting calmly in recliner.

## 2024-07-15 NOTE — Care Management (Signed)
 [facility]BC Care Management...  Ethan Goodwin is a 33 year old male with history of MDD, methamphetamine use disorder, homelessness, and historical diagnosis of schizophrenia who presented to [facility] with desire to initiate detox from methamphetamines. He reports using about 1 g of methamphetamines about 4 days ago. He was supposed to have an appointment with his probation officer, but has rescheduled it for 10/24 and would like to initiate detox from methamphetamines before this appointment.   Writer met with patient...  Patient asked Writer to reach out to probation officer Mrs. Layman  [phone] to advise of location.   Patient reported he wants in patient treatment.  1:48 pm Writer called Mrs Layman No answer left message for return call.  Writer faxed referrals to [employer], [facility] and [facility], [facility] received call from [employer] [facility]. Phone interview appointment scheduled for Friday 07/16/24 @ 9:30 am.   Writer to call office number [phone]

## 2024-07-15 NOTE — ED Notes (Signed)
 Pt oriented to to unit, questions denied. Pt provided with lunch. Pt denies si hi and avh- verbal contract for safety provided. Pt says he feels tired and hopeless. Generalized scars bilateral on feet lower legs hands and wrists. Bilateral swelling of hands and feet. Pt observed and reports to ambulate without difficulty. Pt presents as depressed and guarded, but cooperative.

## 2024-07-15 NOTE — ED Notes (Signed)
 Patient is in the bedroom sleeping, NAD. Respirations even and unlabored.  Will continue to monitor for safety.

## 2024-07-15 NOTE — ED Notes (Signed)
 Patient is bedroom calm and sleeping NAD.  Environment secured per policy. Will keep monitoring for safety.

## 2024-07-15 NOTE — ED Notes (Signed)
 Pt asleep at this hour. No apparent distress. RR even and unlabored. Monitored for safety.

## 2024-07-15 NOTE — Group Note (Signed)
 Group Topic: Communication  Group Date: 07/15/2024 Start Time: 2000 End Time: 2100 Facilitators: Joan Plowman B  Department: [facility]  Number of Participants: 4  Group Focus: abuse issues, coping skills, daily focus, feeling awareness/expression, goals/reality orientation, individual meeting, personal responsibility, relapse prevention, safety plan, social skills, and substance abuse education Treatment Modality:  Individual Therapy and Psychoeducation Interventions utilized were leisure development, patient education, problem solving, story telling, and support Purpose: enhance coping skills, express feelings, increase insight, relapse prevention strategies, and trigger / craving management  Name: Roswell Ndiaye Date of Birth: [birth year]  MR: [id number]    Level of Participation: PT DID NOT ATTEND GROUPS Quality of Participation: attentive and cooperative Interactions with others: gave feedback Mood/Affect: appropriate Triggers (if applicable): NA Cognition: coherent/clear Progress: NONE Response: NA Plan: patient will be encouraged to go to groups  Patients Problems:  Patient Active Problem List   Diagnosis Date Noted   Methamphetamine abuse (HCC) 07/15/2024   Substance induced mood disorder (HCC) 03/24/2024   Abscess of forearm, right 03/29/2021   Cellulitis of right hand 04/10/2020   Positive blood cultures 04/10/2020   IVDU (intravenous drug user)    HIV disease (HCC) 10/20/2019   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

## 2024-07-15 NOTE — ED Provider Notes (Addendum)
 Facility Based Crisis Admission H&P  Date: 07/15/24 Patient Name: Ethan Goodwin MRN: [id number] Chief Complaint: methamphetamine detox  Diagnoses:  Final diagnoses:  Methamphetamine abuse [facility])    HPI: Ethan Goodwin is a 33 year old male with history of MDD, methamphetamine use disorder, homelessness, and historical diagnosis of schizophrenia who presented to [facility] with desire to initiate detox from methamphetamines. He reports using about 1 g of methamphetamines about 4 days ago. He was supposed to have an appointment with his probation officer, but has rescheduled it for 10/24 and would like to initiate detox from methamphetamines before this appointment.   On interview this morning, patient notes frustrations with being unable to get blood drawn at ED overnight. Patient returned to detox and was hopeful for residential treatment. He denies symptoms of methamphetamine withdrawal. He denies, SI, HI, and AVH.  Patient provided phone number of probation officer and wanted someone to contact them to let them know about the status of this detox.  Patient denies any concerns syphilis or urological concerns. Patient previously treated before, however uncertain about treatment course .   Patient consented to contact his probation Officer, Mrs. Layman, [phone] Consented to inform officer about admission for detox   Attempted call x1 at 11:20 AM on 07/15/2024 and got the voicemail.   PHQ 2-9:  Flowsheet Row ED from 03/24/2024 in [facility] Office Visit from 03/29/2021 in [facility] [place]  Thoughts that you would be better off dead, or of hurting yourself in some way Not at all Not at all  PHQ-9 Total Score 0 4    Flowsheet Row ED from 07/14/2024 in [facility] Most recent reading at 07/14/2024 10:52 PM ED from 07/14/2024 in [facility] Emergency Department at  [facility] Most recent reading at 07/14/2024  4:37 PM ED from 07/14/2024 in [facility] Most recent reading at 07/14/2024  3:17 AM  C-SSRS RISK CATEGORY No Risk No Risk No Risk      Total Time spent with patient: 15 minutes  Musculoskeletal  Strength & Muscle Tone: within normal limits Gait & Station: not assessed Patient leans: N/A  Psychiatric Specialty Exam  Presentation General Appearance:  Appropriate for Environment  Eye Contact: Good  Speech: Clear and Coherent  Speech Volume: Normal  Handedness: Right   Mood and Affect  Mood: Euphoric  Affect: Congruent   Thought Process  Thought Processes: Coherent  Descriptions of Associations:Intact  Orientation:Full (Time, Place and Person)  Thought Content:WDL  Diagnosis of Schizophrenia or Schizoaffective disorder in past: No  Duration of Psychotic Symptoms: Greater than six months  Hallucinations:Hallucinations: None  Ideas of Reference:None  Suicidal Thoughts:Suicidal Thoughts: No  Homicidal Thoughts:Homicidal Thoughts: No   Sensorium  Memory: Immediate Fair  Judgment: Poor  Insight: Poor   Executive Functions  Concentration: Fair  Attention Span: Fair  Recall: Fair  Fund of Knowledge: Fair  Language: Fair   Psychomotor Activity  Psychomotor Activity: Psychomotor Activity: Normal   Assets  Assets: Desire for Improvement   Sleep  Sleep: Sleep: Fair Number of Hours of Sleep: 5   Nutritional Assessment (For OBS and FBC admissions only) Has the patient had a weight loss or gain of 10 pounds or more in the last 3 months?: No Has the patient had a decrease in food intake/or appetite?: No Does the patient have dental problems?: No Does the patient have eating habits  or behaviors that may be indicators of an eating disorder including binging or inducing vomiting?: No Has the patient recently lost weight without trying?: 0 Has  the patient been eating poorly because of a decreased appetite?: 0 Malnutrition Screening Tool Score: 0    Physical Exam HENT:     Head: Normocephalic and atraumatic.  Pulmonary:     Effort: Pulmonary effort is normal.  Skin:    Comments: Multiple track marks with circumferential scarring appreciated on bilateral arms and legs  Neurological:     Mental Status: He is alert.  Psychiatric:        Behavior: Behavior normal.        Thought Content: Thought content normal.    Review of Systems  Constitutional:        Patient reports feeling subjectively hot  Neurological:  Negative for tremors.  Psychiatric/Behavioral:  Positive for substance abuse. Negative for hallucinations and suicidal ideas.     Blood pressure 122/83, pulse 86, temperature 97.6 F (36.4 C), temperature source Oral, resp. rate 18, SpO2 98%. There is no height or weight on file to calculate BMI.  Past Psychiatric History: Historical diagnosis of schizphrenia, MDD   Is the patient at risk to self? No  Has the patient been a risk to self in the past 6 months? No .    Has the patient been a risk to self within the distant past? Yes   Is the patient a risk to others? No   Has the patient been a risk to others in the past 6 months? No   Has the patient been a risk to others within the distant past? No   Past Medical History: HIV Family History: none disclosed Social History: none disclosed  Last Labs:  Admission on 07/14/2024, Discharged on 07/14/2024  Component Date Value Ref Range Status   POC Amphetamine UR 07/14/2024 Positive (A)  NONE DETECTED (Cut Off Level 1000 ng/mL) Final   POC Secobarbital (BAR) 07/14/2024 None Detected  NONE DETECTED (Cut Off Level 300 ng/mL) Final   POC Buprenorphine (BUP) 07/14/2024 None Detected  NONE DETECTED (Cut Off Level 10 ng/mL) Final   POC Oxazepam (BZO) 07/14/2024 None Detected  NONE DETECTED (Cut Off Level 300 ng/mL) Final   POC Cocaine UR 07/14/2024 None Detected  NONE  DETECTED (Cut Off Level 300 ng/mL) Final   POC Methamphetamine UR 07/14/2024 Positive (A)  NONE DETECTED (Cut Off Level 1000 ng/mL) Final   POC Morphine  07/14/2024 None Detected  NONE DETECTED (Cut Off Level 300 ng/mL) Final   POC Methadone UR 07/14/2024 None Detected  NONE DETECTED (Cut Off Level 300 ng/mL) Final   POC Oxycodone  UR 07/14/2024 None Detected  NONE DETECTED (Cut Off Level 100 ng/mL) Final   POC Marijuana UR 07/14/2024 None Detected  NONE DETECTED (Cut Off Level 50 ng/mL) Final  Admission on 06/21/2024, Discharged on 06/21/2024  Component Date Value Ref Range Status   Specimen Description 06/21/2024 BLOOD LEFT ANTECUBITAL   Final   Special Requests 06/21/2024 Blood Culture results may not be optimal due to an inadequate volume of blood received in culture bottles BOTTLES DRAWN AEROBIC ONLY   Final   Culture 06/21/2024    Final                   Value:NO GROWTH 5 DAYS Performed at [facility] Lab, [address] [place], [place] [address]    Report Status 06/21/2024 06/26/2024 FINAL   Final  Admission on 06/01/2024, Discharged  on 06/01/2024  Component Date Value Ref Range Status   SARS Coronavirus 2 by RT PCR 06/01/2024 NEGATIVE  NEGATIVE Final   Influenza A by PCR 06/01/2024 NEGATIVE  NEGATIVE Final   Influenza B by PCR 06/01/2024 NEGATIVE  NEGATIVE Final   Comment: (NOTE) The Xpert Xpress SARS-CoV-2/FLU/RSV plus assay is intended as an aid in the diagnosis of influenza from Nasopharyngeal swab specimens and should not be used as a sole basis for treatment. Nasal washings and aspirates are unacceptable for Xpert Xpress SARS-CoV-2/FLU/RSV testing.  Fact Sheet for Patients: BloggerCourse.com  Fact Sheet for Healthcare Providers: SeriousBroker.it  This test is not yet approved or cleared by the [place]  FDA and has been authorized for detection and/or diagnosis of SARS-CoV-2 by FDA under an Emergency Use  Authorization (EUA). This EUA will remain in effect (meaning this test can be used) for the duration of the COVID-19 declaration under Section 564(b)(1) of the Act, 21 U.S.C. section 360bbb-3(b)(1), unless the authorization is terminated or revoked.     Resp Syncytial Virus by PCR 06/01/2024 NEGATIVE  NEGATIVE Final   Comment: (NOTE) Fact Sheet for Patients: BloggerCourse.com  Fact Sheet for Healthcare Providers: SeriousBroker.it  This test is not yet approved or cleared by the [place]  FDA and has been authorized for detection and/or diagnosis of SARS-CoV-2 by FDA under an Emergency Use Authorization (EUA). This EUA will remain in effect (meaning this test can be used) for the duration of the COVID-19 declaration under Section 564(b)(1) of the Act, 21 U.S.C. section 360bbb-3(b)(1), unless the authorization is terminated or revoked.  Performed at [facility], [address], [place], [place] [address]   Office Visit on 04/20/2024  Component Date Value Ref Range Status   Neisseria Gonorrhea 04/20/2024 Negative   Final   Chlamydia 04/20/2024 Negative   Final   Comment 04/20/2024 Normal Reference Ranger Chlamydia - Negative   Final   Comment 04/20/2024 Normal Reference Range Neisseria Gonorrhea - Negative   Final   Neisseria Gonorrhea 04/20/2024 Negative   Final   Chlamydia 04/20/2024 Negative   Final   Comment 04/20/2024 Normal Reference [place] Chlamydia - Negative   Final   Comment 04/20/2024 Normal Reference Range Neisseria Gonorrhea - Negative   Final  Admission on 03/24/2024, Discharged on 03/27/2024  Component Date Value Ref Range Status   WBC 03/24/2024 6.3  4.0 - 10.5 K/uL Final   RBC 03/24/2024 4.37  4.22 - 5.81 MIL/uL Final   Hemoglobin 03/24/2024 13.8  13.0 - 17.0 g/dL Final   HCT 93/74/7974 41.9  39.0 - 52.0 % Final   MCV 03/24/2024 95.9  80.0 - 100.0 fL Final   MCH 03/24/2024 31.6  26.0 - 34.0 pg Final    MCHC 03/24/2024 32.9  30.0 - 36.0 g/dL Final   RDW 93/74/7974 13.1  11.5 - 15.5 % Final   Platelets 03/24/2024 201  150 - 400 K/uL Final   nRBC 03/24/2024 0.0  0.0 - 0.2 % Final   Neutrophils Relative % 03/24/2024 58  % Final   Neutro Abs 03/24/2024 3.6  1.7 - 7.7 K/uL Final   Lymphocytes Relative 03/24/2024 33  % Final   Lymphs Abs 03/24/2024 2.1  0.7 - 4.0 K/uL Final   Monocytes Relative 03/24/2024 7  % Final   Monocytes Absolute 03/24/2024 0.4  0.1 - 1.0 K/uL Final   Eosinophils Relative 03/24/2024 1  % Final   Eosinophils Absolute 03/24/2024 0.1  0.0 - 0.5 K/uL Final   Basophils Relative 03/24/2024  0  % Final   Basophils Absolute 03/24/2024 0.0  0.0 - 0.1 K/uL Final   Immature Granulocytes 03/24/2024 1  % Final   Abs Immature Granulocytes 03/24/2024 0.06  0.00 - 0.07 K/uL Final   Performed at [facility], [address], [place], [place] [address]   Sodium 03/24/2024 139  135 - 145 mmol/L Final   Potassium 03/24/2024 3.8  3.5 - 5.1 mmol/L Final   Chloride 03/24/2024 107  98 - 111 mmol/L Final   CO2 03/24/2024 23  22 - 32 mmol/L Final   Glucose, Bld 03/24/2024 100 (H)  70 - 99 mg/dL Final   Glucose reference range applies only to samples taken after fasting for at least 8 hours.   BUN 03/24/2024 16  6 - 20 mg/dL Final   Creatinine, Ser 03/24/2024 0.98  0.61 - 1.24 mg/dL Final   Calcium 93/74/7974 9.3  8.9 - 10.3 mg/dL Final   Total Protein 93/74/7974 7.9  6.5 - 8.1 g/dL Final   Albumin 93/74/7974 3.7  3.5 - 5.0 g/dL Final   AST 93/74/7974 23  15 - 41 U/L Final   ALT 03/24/2024 19  0 - 44 U/L Final   Alkaline Phosphatase 03/24/2024 87  38 - 126 U/L Final   Total Bilirubin 03/24/2024 1.4 (H)  0.0 - 1.2 mg/dL Final   GFR, Estimated 03/24/2024 >60  >60 mL/min Final   Comment: (NOTE) Calculated using the CKD-EPI Creatinine Equation (2021)    Anion gap 03/24/2024 9  5 - 15 Final   Performed at [facility], [address] [place], [place] [address]   Hgb A1c MFr  Bld 03/24/2024 5.3  4.8 - 5.6 % Final   Comment: (NOTE) Diagnosis of Diabetes The following HbA1c ranges recommended by the American Diabetes Association (ADA) may be used as an aid in the diagnosis of diabetes mellitus.  Hemoglobin             Suggested A1C NGSP%              Diagnosis  <5.7                   Non Diabetic  5.7-6.4                Pre-Diabetic  >6.4                   Diabetic  <7.0                   Glycemic control for                       adults with diabetes.     Mean Plasma Glucose 03/24/2024 105.41  mg/dL Final   Performed at [facility], [address], [place], [place] [address]   Alcohol, Ethyl (B) 03/24/2024 <15  <15 mg/dL Final   Comment: (NOTE) For medical purposes only. Performed at [facility], [address] [place], [place] [address]    Cholesterol 03/24/2024 127  0 - 200 mg/dL Final   Triglycerides 93/74/7974 49  <150 mg/dL Final   HDL 93/74/7974 54  >40 mg/dL Final   Total CHOL/HDL Ratio 03/24/2024 2.4  RATIO Final   VLDL 03/24/2024 10  0 - 40 mg/dL Final   LDL Cholesterol 03/24/2024 63  0 - 99 mg/dL Final   Comment:        Total Cholesterol/HDL:CHD Risk Coronary  Heart Disease Risk Table                     Men   Women  1/2 Average Risk   3.4   3.3  Average Risk       5.0   4.4  2 X Average Risk   9.6   7.1  3 X Average Risk  23.4   11.0        Use the calculated Patient Ratio above and the CHD Risk Table to determine the patient's CHD Risk.        ATP III CLASSIFICATION (LDL):  <100     mg/dL   Optimal  899-870  mg/dL   Near or Above                    Optimal  130-159  mg/dL   Borderline  839-810  mg/dL   High  >809     mg/dL   Very High Performed at [facility], [address] [place], [place] [address]    TSH 03/24/2024 1.305  0.350 - 4.500 uIU/mL Final   Comment: Performed by a 3rd Generation assay with a functional sensitivity of <=0.01 uIU/mL. Performed at [facility], [address] [place], [place] [address]    Color, Urine 03/24/2024 AMBER (A)  YELLOW Final   BIOCHEMICALS MAY BE AFFECTED BY COLOR   APPearance 03/24/2024 CLEAR  CLEAR Final   Specific Gravity, Urine 03/24/2024 1.030  1.005 - 1.030 Final   pH 03/24/2024 5.0  5.0 - 8.0 Final   Glucose, UA 03/24/2024 NEGATIVE  NEGATIVE mg/dL Final   Hgb urine dipstick 03/24/2024 NEGATIVE  NEGATIVE Final   Bilirubin Urine 03/24/2024 NEGATIVE  NEGATIVE Final   Ketones, ur 03/24/2024 NEGATIVE  NEGATIVE mg/dL Final   Protein, ur 93/74/7974 NEGATIVE  NEGATIVE mg/dL Final   Nitrite 93/74/7974 NEGATIVE  NEGATIVE Final   Leukocytes,Ua 03/24/2024 NEGATIVE  NEGATIVE Final   Performed at [facility], [address], [place], [place] [address]   POC Amphetamine UR 03/24/2024 Positive (A)  NONE DETECTED (Cut Off Level 1000 ng/mL) Final   POC Secobarbital (BAR) 03/24/2024 None Detected  NONE DETECTED (Cut Off Level 300 ng/mL) Final   POC Buprenorphine (BUP) 03/24/2024 None Detected  NONE DETECTED (Cut Off Level 10 ng/mL) Final   POC Oxazepam (BZO) 03/24/2024 None Detected  NONE DETECTED (Cut Off Level 300 ng/mL) Final   POC Cocaine UR 03/24/2024 None Detected  NONE DETECTED (Cut Off Level 300 ng/mL) Final   POC Methamphetamine UR 03/24/2024 Positive (A)  NONE DETECTED (Cut Off Level 1000 ng/mL) Final   POC Morphine  03/24/2024 None Detected  NONE DETECTED (Cut Off Level 300 ng/mL) Final   POC Methadone UR 03/24/2024 None Detected  NONE DETECTED (Cut Off Level 300 ng/mL) Final   POC Oxycodone  UR 03/24/2024 None Detected  NONE DETECTED (Cut Off Level 100 ng/mL) Final   POC Marijuana UR 03/24/2024 None Detected  NONE DETECTED (Cut Off Level 50 ng/mL) Final    Allergies: Patient has no known allergies.  Medications:  Facility Ordered Medications  Medication   acetaminophen  (TYLENOL ) tablet 650 mg   alum & mag hydroxide-simeth (MAALOX/MYLANTA) 200-200-20 MG/5ML suspension 30 mL   magnesium  hydroxide (MILK OF MAGNESIA)  suspension 30 mL   haloperidol  (HALDOL ) tablet 5 mg   And   diphenhydrAMINE  (BENADRYL ) capsule 50 mg   haloperidol  lactate (HALDOL ) injection 5 mg   And  diphenhydrAMINE  (BENADRYL ) injection 50 mg   And   LORazepam  (ATIVAN ) injection 2 mg   haloperidol  lactate (HALDOL ) injection 10 mg   And   diphenhydrAMINE  (BENADRYL ) injection 50 mg   And   LORazepam  (ATIVAN ) injection 2 mg   nicotine  (NICODERM CQ  - dosed in mg/24 hours) patch 14 mg   bictegravir-emtricitabine-tenofovir AF (BIKTARVY ) 50-200-25 MG per tablet 1 tablet   FLUoxetine  (PROZAC ) capsule 10 mg   risperiDONE  (RISPERDAL ) tablet 1 mg   traZODone  (DESYREL ) tablet 50 mg   PTA Medications  Medication Sig   FLUoxetine  (PROZAC ) 10 MG capsule Take 1 capsule (10 mg total) by mouth daily.   nicotine  (NICODERM CQ  - DOSED IN MG/24 HOURS) 21 mg/24hr patch Place 1 patch (21 mg total) onto the skin daily at 6 (six) AM. (Patient not taking: Reported on 07/14/2024)   risperiDONE  (RISPERDAL ) 1 MG tablet Take 1 tablet (1 mg total) by mouth 2 (two) times daily.   traZODone  (DESYREL ) 50 MG tablet Take 1 tablet (50 mg total) by mouth at bedtime as needed for sleep. (Patient not taking: Reported on 07/14/2024)   bictegravir-emtricitabine-tenofovir AF (BIKTARVY ) 50-200-25 MG TABS tablet Take 1 tablet by mouth daily.    Long Term Goals: Improvement in symptoms so as ready for discharge  Short Term Goals: Patient will verbalize feelings in meetings with treatment team members. and Patient will take medications as prescribed daily.  Medical Decision Making  Ethan Goodwin is a 33 year old male with history of MDD, methamphetamine use disorder, homelessness, and historical diagnosis of schizophrenia who presented to [facility] with desire to initiate detox from methamphetamines. He reports using about 1 g of methamphetamines about 3-4 days ago. He was supposed to have an appointment with his probation officer, but has rescheduled it for 10/24 and would  like to initiate detox from methamphetamines before this appointment.   Based on patient current status of having no SI, HI, or AVH and interest in initiating detox, will recommend further treatment through facility based crisis.  Patient was readmitted to the observation unit overnight, after discharge to the ED. They were unable to get labs for collection. Given patient's current status and desire for residential treatment will engage social work to see about options for referral.  Patient will also be restarted on psychotropic medication regimen that was previously effective for him, Prozac  10mg  and Risperdal  1mg  BID.     Recommendations  Based on my evaluation the patient does not appear to have an emergency medical condition.  -- Continue home Prozac  10 mg for anxiety/depression -- Continue Risperdal  1 mg twice daily for prior history of schizophrenia -- Nicotine  patch 21 mg for nicotine  dependence as needed  As needed agitation medications ordered and additional supportive meds for symptomatic treatment  PATTI OLDEN, MD 07/15/24  10:49 AM

## 2024-07-16 MED ORDER — CLINDAMYCIN HCL 150 MG PO CAPS
300.0000 mg | ORAL_CAPSULE | Freq: Three times a day (TID) | ORAL | Status: DC
  Administered 2024-07-16 – 2024-07-17 (×4): 300 mg via ORAL
  Filled 2024-07-16 (×4): qty 2

## 2024-07-16 NOTE — Group Note (Signed)
 Group Topic: Balance in Life  Group Date: 07/16/2024 Start Time: 1200 End Time: 1230 Facilitators: Janit Lowanda BIRCH, NT  Department: [facility]  Number of Participants: 3  Group Focus: acceptance Treatment Modality:  Art Therapy and Spiritual Interventions utilized were clarification Purpose: enhance coping skills  Name: Ethan Goodwin Date of Birth: [birth year]  MR: [id number]    Level of Participation: minimal Quality of Participation: cooperative Interactions with others: gave feedback Mood/Affect: appropriate Triggers (if applicable):   Cognition: concrete Progress: Moderate Response:   Plan: follow-up needed  Patients Problems:  Patient Active Problem List   Diagnosis Date Noted   Methamphetamine abuse (HCC) 07/15/2024   Substance induced mood disorder (HCC) 03/24/2024   Abscess of forearm, right 03/29/2021   Cellulitis of right hand 04/10/2020   Positive blood cultures 04/10/2020   IVDU (intravenous drug user)    HIV disease (HCC) 10/20/2019   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

## 2024-07-16 NOTE — ED Notes (Signed)
 Patient noted to have a circular abscess on left leg. Area marked as ordered per provider. Patient stated it was tender to touch but denied major pain. No open skin tear or bleeding noted.

## 2024-07-16 NOTE — Group Note (Signed)
 Group Topic: Relaxation  Group Date: 07/16/2024 Start Time: 2010 End Time: 2049 Facilitators: Tanda Ogren D, NT  Department: [facility]  Number of Participants: 3  Group Focus: check in Treatment Modality:  Psychoeducation Interventions utilized were support Purpose: reinforce self-care  Name: Ethan Goodwin Date of Birth: [birth year]  MR: [id number]    Level of Participation: moderate Quality of Participation: attentive Interactions with others: gave feedback Mood/Affect: appropriate Triggers (if applicable): n/a Cognition: coherent/clear Progress: Significant Response: n/a Plan: follow-up needed  Patients Problems:  Patient Active Problem List   Diagnosis Date Noted   Methamphetamine abuse (HCC) 07/15/2024   Substance induced mood disorder (HCC) 03/24/2024   Abscess of forearm, right 03/29/2021   Cellulitis of right hand 04/10/2020   Positive blood cultures 04/10/2020   IVDU (intravenous drug user)    HIV disease (HCC) 10/20/2019   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

## 2024-07-16 NOTE — ED Notes (Signed)
 Patient asleep, NAD. Will continue to monitor for safety.

## 2024-07-16 NOTE — [facility] Group Notes (Signed)
 Spiritual Care and Counseling Group Note  07/16/2024 2:45pm  Facilitated by: Librada Donnice Lin   Type of Therapy and Topic:  Hope    Participation Level:  Minimal  Description of Group:  Group focused on topic of hope.  Patients participated in facilitated discussion around topic, connecting with one another around experiences and definitions for hope.  Group members engaged with group word cloud.  Members selected an image of what hope looks like for them today.  Group engaged in discussion around how their definitions of hope are present today in hospital.    Summary of Patient Progress: Ethan Goodwin was present for group introductions.  Left the room within first 10 minutes of group and did not return.    Therapeutic Modalities: Psycho-social ed, Adlerian, Narrative, MI   Librada Donnice Lin, Chaplain 07/16/2024 4:31 PM

## 2024-07-16 NOTE — ED Notes (Signed)
 Patient ate breakfast this morning, stated he slept well last night, and is med compliant. Patient currently denies SI,HI, and A/V/H and remains cooperative in unit.

## 2024-07-16 NOTE — ED Notes (Signed)
 Patient reported to nurse leader his room was too warm. Room temperature adjusted and patient is currently resting with no further issues. No s/s of current distress.

## 2024-07-16 NOTE — ED Notes (Signed)
 Pt awake and alert x 4.  Denies SI, HI or AVH.  Pt maintaining appropriate boundaries. Currently sitting in dayroom with peers watching movie.  No distress noted.  Q 15 min checks for safety in place.

## 2024-07-16 NOTE — ED Provider Notes (Signed)
 Behavioral Health Progress Note  Date and Time: 07/16/2024 1:26 PM Name: Ethan Goodwin MRN:  [id number]  Subjective:  "Trying to get sober."  Diagnosis:  Final diagnoses:  Methamphetamine abuse (HCC)    Pt is seen face-to-face on the Facility Based Crisis [facility]) unit. Pt is alert & oriented and engages in today's assessment. Today, pt states trying to get sober and is seeking  seeking inpatient substance use treatment. He reports his primary substance of use is methamphetamine, which he has been using for approximately two to three years, half gram via IV with last use a week ago; tobacco use of half ppd. He has multiple healed injection sites to his extremities. He denied smoking cessation medication. He denies consistent use of THC, however states he last used this week to substitute for the meth. Denies heroin or fentanyl use. States methamphetamine use helped him chill out and focus. He reports a history of schizophrenia; states he was not on psychotropic medication prior to admission; the last time was when I was here the last time He was restarted on Prozac  and Risperdal  on 07/15/24 and has been adherent with medication. He reports an area of tenderness to his left lateral malleolus. There is an area of fluctuance noted over the left malleolus. Surrounding tissue appears sclerotic and darker than surrounding skin. No active drainage observed. Pt denies recent injection of substances to this area.Will begin antibiotic therapy. Chart reviewed and discussed with attending psychiatrist, Dr Garvin [name].  Will initiate   He denies suicidal or homicidal ideation, intent or plan. He denies AVH.    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  Family History: family history is not on file.  Family  Psychiatric  History: none reported Social History: Unhoused, unemployed.  Additional Social History:   Sleep: Fair  Appetite:  Good  Current Medications:  Current Facility-Administered Medications  Medication Dose Route Frequency Provider Last Rate Last Admin   acetaminophen  (TYLENOL ) tablet 650 mg  650 mg Oral Q6H PRN Rainey, Donovan, MD       alum & mag hydroxide-simeth (MAALOX/MYLANTA) 200-200-20 MG/5ML suspension 30 mL  30 mL Oral Q4H PRN Lenard Calin, MD       bictegravir-emtricitabine-tenofovir AF (BIKTARVY ) 50-200-25 MG per tablet 1 tablet  1 tablet Oral Daily Lenard Calin, MD   1 tablet at 07/16/24 9066   haloperidol  (HALDOL ) tablet 5 mg  5 mg Oral TID PRN Lenard Calin, MD       And   diphenhydrAMINE  (BENADRYL ) capsule 50 mg  50 mg Oral TID PRN Lenard Calin, MD       haloperidol  lactate (HALDOL ) injection 5 mg  5 mg Intramuscular TID PRN Lenard Calin, MD       And   diphenhydrAMINE  (BENADRYL ) injection 50 mg  50 mg Intramuscular TID PRN Lenard Calin, MD       And   LORazepam  (ATIVAN ) injection 2 mg  2 mg Intramuscular TID PRN Lenard Calin, MD       FLUoxetine  (PROZAC ) capsule 10 mg  10 mg Oral Daily Lenard Calin, MD   10 mg at 07/16/24 9066   hydrOXYzine  (ATARAX ) tablet 25 mg  25 mg Oral TID PRN Lenard Calin, MD       magnesium  hydroxide (MILK OF MAGNESIA) suspension 30 mL  30 mL Oral Daily PRN Lenard Calin, MD  nicotine  (NICODERM CQ  - dosed in mg/24 hours) patch 21 mg  21 mg Transdermal PRN Lenard Calin, MD       risperiDONE  (RISPERDAL ) tablet 1 mg  1 mg Oral BID Lenard Calin, MD   1 mg at 07/16/24 9066   traZODone  (DESYREL ) tablet 50 mg  50 mg Oral QHS PRN Lenard Calin, MD   50 mg at 07/15/24 2108   Current Outpatient Medications  Medication Sig Dispense Refill   bictegravir-emtricitabine-tenofovir AF (BIKTARVY ) 50-200-25 MG TABS tablet Take 1 tablet by mouth daily. 30 tablet 2   FLUoxetine  (PROZAC ) 10 MG capsule Take 1 capsule  (10 mg total) by mouth daily. 30 capsule 0   risperiDONE  (RISPERDAL ) 1 MG tablet Take 1 tablet (1 mg total) by mouth 2 (two) times daily. 60 tablet 0   nicotine  (NICODERM CQ  - DOSED IN MG/24 HOURS) 21 mg/24hr patch Place 1 patch (21 mg total) onto the skin daily at 6 (six) AM. (Patient not taking: Reported on 07/14/2024) 28 patch 0   traZODone  (DESYREL ) 50 MG tablet Take 1 tablet (50 mg total) by mouth at bedtime as needed for sleep. (Patient not taking: Reported on 07/14/2024) 30 tablet 0    Labs  Lab Results:  Admission on 07/14/2024, Discharged on 07/14/2024  Component Date Value Ref Range Status   POC Amphetamine UR 07/14/2024 Positive (A)  NONE DETECTED (Cut Off Level 1000 ng/mL) Final   POC Secobarbital (BAR) 07/14/2024 None Detected  NONE DETECTED (Cut Off Level 300 ng/mL) Final   POC Buprenorphine (BUP) 07/14/2024 None Detected  NONE DETECTED (Cut Off Level 10 ng/mL) Final   POC Oxazepam (BZO) 07/14/2024 None Detected  NONE DETECTED (Cut Off Level 300 ng/mL) Final   POC Cocaine UR 07/14/2024 None Detected  NONE DETECTED (Cut Off Level 300 ng/mL) Final   POC Methamphetamine UR 07/14/2024 Positive (A)  NONE DETECTED (Cut Off Level 1000 ng/mL) Final   POC Morphine  07/14/2024 None Detected  NONE DETECTED (Cut Off Level 300 ng/mL) Final   POC Methadone UR 07/14/2024 None Detected  NONE DETECTED (Cut Off Level 300 ng/mL) Final   POC Oxycodone  UR 07/14/2024 None Detected  NONE DETECTED (Cut Off Level 100 ng/mL) Final   POC Marijuana UR 07/14/2024 None Detected  NONE DETECTED (Cut Off Level 50 ng/mL) Final  Admission on 06/21/2024, Discharged on 06/21/2024  Component Date Value Ref Range Status   Specimen Description 06/21/2024 BLOOD LEFT ANTECUBITAL   Final   Special Requests 06/21/2024 Blood Culture results may not be optimal due to an inadequate volume of blood received in culture bottles BOTTLES DRAWN AEROBIC ONLY   Final   Culture 06/21/2024    Final                   Value:NO GROWTH 5  DAYS Performed at [facility], [address] [place], [place] [address]    Report Status 06/21/2024 06/26/2024 FINAL   Final  Admission on 06/01/2024, Discharged on 06/01/2024  Component Date Value Ref Range Status   SARS Coronavirus 2 by RT PCR 06/01/2024 NEGATIVE  NEGATIVE Final   Influenza A by PCR 06/01/2024 NEGATIVE  NEGATIVE Final   Influenza B by PCR 06/01/2024 NEGATIVE  NEGATIVE Final   Comment: (NOTE) The Xpert Xpress SARS-CoV-2/FLU/RSV plus assay is intended as an aid in the diagnosis of influenza from Nasopharyngeal swab specimens and should not be used as a sole basis for treatment. Nasal washings and aspirates are unacceptable for Xpert Xpress SARS-CoV-2/FLU/RSV testing.  Fact Sheet for Patients: BloggerCourse.com  Fact Sheet for Healthcare Providers: SeriousBroker.it  This test is not yet approved or cleared by the [place]  FDA and has been authorized for detection and/or diagnosis of SARS-CoV-2 by FDA under an Emergency Use Authorization (EUA). This EUA will remain in effect (meaning this test can be used) for the duration of the COVID-19 declaration under Section 564(b)(1) of the Act, 21 U.S.C. section 360bbb-3(b)(1), unless the authorization is terminated or revoked.     Resp Syncytial Virus by PCR 06/01/2024 NEGATIVE  NEGATIVE Final   Comment: (NOTE) Fact Sheet for Patients: BloggerCourse.com  Fact Sheet for Healthcare Providers: SeriousBroker.it  This test is not yet approved or cleared by the [place]  FDA and has been authorized for detection and/or diagnosis of SARS-CoV-2 by FDA under an Emergency Use Authorization (EUA). This EUA will remain in effect (meaning this test can be used) for the duration of the COVID-19 declaration under Section 564(b)(1) of the Act, 21 U.S.C. section 360bbb-3(b)(1), unless the authorization is terminated  or revoked.  Performed at [facility], [address], [place], [place] [address]   Office Visit on 04/20/2024  Component Date Value Ref Range Status   Neisseria Gonorrhea 04/20/2024 Negative   Final   Chlamydia 04/20/2024 Negative   Final   Comment 04/20/2024 Normal Reference Ranger Chlamydia - Negative   Final   Comment 04/20/2024 Normal Reference Range Neisseria Gonorrhea - Negative   Final   Neisseria Gonorrhea 04/20/2024 Negative   Final   Chlamydia 04/20/2024 Negative   Final   Comment 04/20/2024 Normal Reference Ranger Chlamydia - Negative   Final   Comment 04/20/2024 Normal Reference Range Neisseria Gonorrhea - Negative   Final  Admission on 03/24/2024, Discharged on 03/27/2024  Component Date Value Ref Range Status   WBC 03/24/2024 6.3  4.0 - 10.5 K/uL Final   RBC 03/24/2024 4.37  4.22 - 5.81 MIL/uL Final   Hemoglobin 03/24/2024 13.8  13.0 - 17.0 g/dL Final   HCT 93/74/7974 41.9  39.0 - 52.0 % Final   MCV 03/24/2024 95.9  80.0 - 100.0 fL Final   MCH 03/24/2024 31.6  26.0 - 34.0 pg Final   MCHC 03/24/2024 32.9  30.0 - 36.0 g/dL Final   RDW 93/74/7974 13.1  11.5 - 15.5 % Final   Platelets 03/24/2024 201  150 - 400 K/uL Final   nRBC 03/24/2024 0.0  0.0 - 0.2 % Final   Neutrophils Relative % 03/24/2024 58  % Final   Neutro Abs 03/24/2024 3.6  1.7 - 7.7 K/uL Final   Lymphocytes Relative 03/24/2024 33  % Final   Lymphs Abs 03/24/2024 2.1  0.7 - 4.0 K/uL Final   Monocytes Relative 03/24/2024 7  % Final   Monocytes Absolute 03/24/2024 0.4  0.1 - 1.0 K/uL Final   Eosinophils Relative 03/24/2024 1  % Final   Eosinophils Absolute 03/24/2024 0.1  0.0 - 0.5 K/uL Final   Basophils Relative 03/24/2024 0  % Final   Basophils Absolute 03/24/2024 0.0  0.0 - 0.1 K/uL Final   Immature Granulocytes 03/24/2024 1  % Final   Abs Immature Granulocytes 03/24/2024 0.06  0.00 - 0.07 K/uL Final   Performed at [facility], [address], [place], [place] [address]   Sodium  03/24/2024 139  135 - 145 mmol/L Final   Potassium 03/24/2024 3.8  3.5 - 5.1 mmol/L Final   Chloride 03/24/2024 107  98 - 111 mmol/L Final   CO2 03/24/2024 23  22 - 32 mmol/L Final   Glucose, Bld 03/24/2024 100 (H)  70 - 99 mg/dL Final   Glucose reference range applies only to samples taken after fasting for at least 8 hours.   BUN 03/24/2024 16  6 - 20 mg/dL Final   Creatinine, Ser 03/24/2024 0.98  0.61 - 1.24 mg/dL Final   Calcium 93/74/7974 9.3  8.9 - 10.3 mg/dL Final   Total Protein 93/74/7974 7.9  6.5 - 8.1 g/dL Final   Albumin 93/74/7974 3.7  3.5 - 5.0 g/dL Final   AST 93/74/7974 23  15 - 41 U/L Final   ALT 03/24/2024 19  0 - 44 U/L Final   Alkaline Phosphatase 03/24/2024 87  38 - 126 U/L Final   Total Bilirubin 03/24/2024 1.4 (H)  0.0 - 1.2 mg/dL Final   GFR, Estimated 03/24/2024 >60  >60 mL/min Final   Comment: (NOTE) Calculated using the CKD-EPI Creatinine Equation (2021)    Anion gap 03/24/2024 9  5 - 15 Final   Performed at [facility], [address], [place], [place] [address]   Hgb A1c MFr Bld 03/24/2024 5.3  4.8 - 5.6 % Final   Comment: (NOTE) Diagnosis of Diabetes The following HbA1c ranges recommended by the American Diabetes Association (ADA) may be used as an aid in the diagnosis of diabetes mellitus.  Hemoglobin             Suggested A1C NGSP%              Diagnosis  <5.7                   Non Diabetic  5.7-6.4                Pre-Diabetic  >6.4                   Diabetic  <7.0                   Glycemic control for                       adults with diabetes.     Mean Plasma Glucose 03/24/2024 105.41  mg/dL Final   Performed at [facility], [address], [place], [place] [address]   Alcohol, Ethyl (B) 03/24/2024 <15  <15 mg/dL Final   Comment: (NOTE) For medical purposes only. Performed at [facility], [address], [place], [place] [address]    Cholesterol 03/24/2024 127  0 - 200 mg/dL Final   Triglycerides 93/74/7974 49   <150 mg/dL Final   HDL 93/74/7974 54  >40 mg/dL Final   Total CHOL/HDL Ratio 03/24/2024 2.4  RATIO Final   VLDL 03/24/2024 10  0 - 40 mg/dL Final   LDL Cholesterol 03/24/2024 63  0 - 99 mg/dL Final   Comment:        Total Cholesterol/HDL:CHD Risk Coronary Heart Disease Risk Table                     Men   Women  1/2 Average Risk   3.4   3.3  Average Risk       5.0   4.4  2 X Average Risk   9.6   7.1  3 X Average Risk  23.4   11.0        Use the calculated Patient Ratio above and the CHD Risk Table to determine  the patient's CHD Risk.        ATP III CLASSIFICATION (LDL):  <100     mg/dL   Optimal  899-870  mg/dL   Near or Above                    Optimal  130-159  mg/dL   Borderline  839-810  mg/dL   High  >809     mg/dL   Very High Performed at [facility], [address] [place], [place] [address]    TSH 03/24/2024 1.305  0.350 - 4.500 uIU/mL Final   Comment: Performed by a 3rd Generation assay with a functional sensitivity of <=0.01 uIU/mL. Performed at [facility], [address] [place], [place] [address]    Color, Urine 03/24/2024 AMBER (A)  YELLOW Final   BIOCHEMICALS MAY BE AFFECTED BY COLOR   APPearance 03/24/2024 CLEAR  CLEAR Final   Specific Gravity, Urine 03/24/2024 1.030  1.005 - 1.030 Final   pH 03/24/2024 5.0  5.0 - 8.0 Final   Glucose, UA 03/24/2024 NEGATIVE  NEGATIVE mg/dL Final   Hgb urine dipstick 03/24/2024 NEGATIVE  NEGATIVE Final   Bilirubin Urine 03/24/2024 NEGATIVE  NEGATIVE Final   Ketones, ur 03/24/2024 NEGATIVE  NEGATIVE mg/dL Final   Protein, ur 93/74/7974 NEGATIVE  NEGATIVE mg/dL Final   Nitrite 93/74/7974 NEGATIVE  NEGATIVE Final   Leukocytes,Ua 03/24/2024 NEGATIVE  NEGATIVE Final   Performed at [facility], [address] [place], [place] [address]   POC Amphetamine UR 03/24/2024 Positive (A)  NONE DETECTED (Cut Off Level 1000 ng/mL) Final   POC Secobarbital (BAR) 03/24/2024 None Detected  NONE DETECTED (Cut Off Level  300 ng/mL) Final   POC Buprenorphine (BUP) 03/24/2024 None Detected  NONE DETECTED (Cut Off Level 10 ng/mL) Final   POC Oxazepam (BZO) 03/24/2024 None Detected  NONE DETECTED (Cut Off Level 300 ng/mL) Final   POC Cocaine UR 03/24/2024 None Detected  NONE DETECTED (Cut Off Level 300 ng/mL) Final   POC Methamphetamine UR 03/24/2024 Positive (A)  NONE DETECTED (Cut Off Level 1000 ng/mL) Final   POC Morphine  03/24/2024 None Detected  NONE DETECTED (Cut Off Level 300 ng/mL) Final   POC Methadone UR 03/24/2024 None Detected  NONE DETECTED (Cut Off Level 300 ng/mL) Final   POC Oxycodone  UR 03/24/2024 None Detected  NONE DETECTED (Cut Off Level 100 ng/mL) Final   POC Marijuana UR 03/24/2024 None Detected  NONE DETECTED (Cut Off Level 50 ng/mL) Final    Blood Alcohol level:  Lab Results  Component Value Date   [facility] <15 03/24/2024    Metabolic Disorder Labs: Lab Results  Component Value Date   HGBA1C 5.3 03/24/2024   MPG 105.41 03/24/2024   No results found for: PROLACTIN Lab Results  Component Value Date   CHOL 127 03/24/2024   TRIG 49 03/24/2024   HDL 54 03/24/2024   CHOLHDL 2.4 03/24/2024   VLDL 10 03/24/2024   LDLCALC 63 03/24/2024   LDLCALC 68 08/03/2021    Therapeutic Lab Levels: No results found for: LITHIUM No results found for: VALPROATE No results found for: CBMZ  Physical Findings   PHQ2-9    Flowsheet Row ED from 07/15/2024 in [facility] ED from 03/24/2024 in [facility] Office Visit from 06/09/2023 in [facility] - A Dept Of [place]. [facility] Office Visit from 07/03/2021 in [facility]  Dis - A Dept Of [place]. [facility] Office Visit from 03/29/2021 in [facility] - A Dept Of [place]. [facility]  PHQ-2 Total Score 6 0 0 0 2  PHQ-9 Total Score 10 0 -- -- 4   Flowsheet Row ED from 07/15/2024 in [facility] Most recent reading at 07/15/2024 11:54 AM ED from 07/14/2024 in [facility] Most recent reading at 07/14/2024 10:52 PM ED from 07/14/2024 in [facility] Emergency Department at [facility] Most recent reading at 07/14/2024  4:37 PM  C-SSRS RISK CATEGORY No Risk No Risk No Risk     Musculoskeletal  Strength & Muscle Tone: within normal limits Gait & Station: normal Patient leans: N/A  Psychiatric Specialty Exam  Presentation  General Appearance:  Appropriate for Environment; Fairly Groomed  Eye Contact: Good  Speech: Clear and Coherent; Normal Rate  Speech Volume: Normal  Handedness: Right   Mood and Affect  Mood: Depressed  Affect: Congruent   Thought Process  Thought Processes: Coherent  Descriptions of Associations:Intact  Orientation:Full (Time, Place and Person)  Thought Content:Logical  Diagnosis of Schizophrenia or Schizoaffective disorder in past: No  Duration of Psychotic Symptoms: Greater than six months   Hallucinations:Hallucinations: None  Ideas of Reference:None  Suicidal Thoughts:Suicidal Thoughts: No  Homicidal Thoughts:Homicidal Thoughts: No   Sensorium  Memory: Recent Good; Remote Good  Judgment: Fair  Insight: Fair   Art therapist  Concentration: Good  Attention Span: Good  Recall: Good  Fund of Knowledge: Good  Language: Good   Psychomotor Activity  Psychomotor Activity:Psychomotor Activity: Normal   Assets  Assets: Communication Skills; Desire for Improvement; Resilience   Sleep  Sleep:Sleep: Fair  Estimated Sleeping Duration (Last 24 Hours): 13.50-16.50 hours  No data recorded  Physical Exam  Physical Exam Vitals and nursing note reviewed.  HENT:     Head: Normocephalic.     Mouth/Throat:     Mouth: Mucous membranes are moist.  Cardiovascular:     Rate and Rhythm: Normal rate.  Pulmonary:     Effort: Pulmonary  effort is normal.  Musculoskeletal:        General: Normal range of motion.  Skin:    General: Skin is warm and dry.     Comments: Area of fluctuance noted over the left malleolus  Neurological:     Mental Status: He is alert and oriented to person, place, and time.  Psychiatric:     Comments: See HPI    Review of Systems  Constitutional:  Negative for chills and fever.  HENT:  Negative for sore throat.   Respiratory:  Negative for shortness of breath.   Cardiovascular:  Negative for chest pain and palpitations.  Gastrointestinal:  Negative for diarrhea, nausea and vomiting.  Psychiatric/Behavioral:  Positive for depression and substance abuse. Negative for hallucinations and suicidal ideas.    Blood pressure 111/76, pulse 94, temperature 98.4 F (36.9 C), temperature source Oral, resp. rate 18, SpO2 100%. There is no height or weight on file to calculate BMI.  Treatment Plan Summary: Daily contact with patient to assess and evaluate symptoms and progress in treatment  Medication management Agitation protocol Continue Biktarvy  50-200-25 mg PO every day Continue Prozac  10 mg PO QAM Continue Risperdal  1 mg PO BID Continue trazodone  50 mg PO at bedtime prn sleep Start clindamycin 300 mg PO TID x 10 days   Plan Care coordination to assist with outpatient/residential SUD treatment  as appropriate.    Sherrell Culver, PMHNP-BC, FNP-BC  07/16/2024 1:26 PM

## 2024-07-16 NOTE — Group Note (Signed)
 Group Topic: Overcoming Obstacles  Group Date: 07/16/2024 Start Time: 1500 End Time: 1530 Facilitators: Byrd, Lakiya Cottam, RN  Department: [facility]  Number of Participants: 1  Group Focus: check in and nursing group Treatment Modality:  Psychoeducation Interventions utilized were exploration, problem solving, and support Purpose: enhance coping skills, explore maladaptive thinking, express feelings, increase insight, regain self-worth, and reinforce self-care  Name: Isaiyah Feldhaus Date of Birth: 1991-05-25  MR: [id number]    Level of Participation: Did not Attend Quality of Participation:  Interactions with others:  Mood/Affect:  Triggers (if applicable):  Cognition:  Progress:  Response:  Plan:   Patients Problems:  Patient Active Problem List   Diagnosis Date Noted   Methamphetamine abuse (HCC) 07/15/2024   Substance induced mood disorder (HCC) 03/24/2024   Abscess of forearm, right 03/29/2021   Cellulitis of right hand 04/10/2020   Positive blood cultures 04/10/2020   IVDU (intravenous drug user)    HIV disease (HCC) 10/20/2019   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

## 2024-07-16 NOTE — Care Management (Addendum)
 [facility] Care Management...  Clinical research associate received call from [place], [employer]. Due to the type of insurance plan patient has [facility] will not be to accept him.  Writer received call from [place], per [place] patient can/will be accepted. However, patient needs to be compliant with taking his medications. Requested documentation to show patient has been compliant.   Writer reached out to Engineer, drilling Mrs. Layman, no answer left message  Writer spoke with patient, advised of communications and to continue to be compliant with medications.  Patient asked if someone could reach out to [facility], Ms Jackson. Patient did not have number, Clinical research associate will research   10:09 am Clinical research associate received call from [employer]. Writer advised Mrs. Layman that patient is currently at our facility and that I am working on getting him placed in inpatient treatment. Officer Layman agreed that patient does need in patient. Officer Layman communicated that patient had some interest in [facility] in [place].  11:00 am Writer will follow-up with patient Writer met with patient and advised that I made contact with probation officer. Patient stated he would be interested in any long term treatment  Writer obtained Admissions number for [facility]. Patient has to call Admissions [phone] between the hours 8-11 or 1-5 to complete telephone screening.

## 2024-07-17 ENCOUNTER — Emergency Department (HOSPITAL_COMMUNITY)
Admission: EM | Admit: 2024-07-17 | Discharge: 2024-07-17 | Discharge status: Against Medical Advice | Attending: Emergency Medicine | Admitting: Emergency Medicine

## 2024-07-17 DIAGNOSIS — Z0189 Encounter for other specified special examinations: Secondary | ICD-10-CM

## 2024-07-17 DIAGNOSIS — Z21 Asymptomatic human immunodeficiency virus [HIV] infection status: Secondary | ICD-10-CM | POA: Insufficient documentation

## 2024-07-17 DIAGNOSIS — Z5329 Procedure and treatment not carried out because of patient's decision for other reasons: Secondary | ICD-10-CM | POA: Insufficient documentation

## 2024-07-17 DIAGNOSIS — F17210 Nicotine dependence, cigarettes, uncomplicated: Secondary | ICD-10-CM | POA: Diagnosis not present

## 2024-07-17 DIAGNOSIS — F1510 Other stimulant abuse, uncomplicated: Secondary | ICD-10-CM | POA: Diagnosis not present

## 2024-07-17 DIAGNOSIS — Z01812 Encounter for preprocedural laboratory examination: Secondary | ICD-10-CM | POA: Insufficient documentation

## 2024-07-17 DIAGNOSIS — Z5321 Procedure and treatment not carried out due to patient leaving prior to being seen by health care provider: Secondary | ICD-10-CM

## 2024-07-17 MED ORDER — FLUOXETINE HCL 10 MG PO CAPS
10.0000 mg | ORAL_CAPSULE | Freq: Once | ORAL | Status: AC
  Administered 2024-07-17: 10 mg via ORAL
  Filled 2024-07-17: qty 1

## 2024-07-17 MED ORDER — FLUOXETINE HCL 20 MG PO CAPS: 20.0000 mg | ORAL_CAPSULE | Freq: Once | ORAL | Status: DC

## 2024-07-17 NOTE — ED Notes (Signed)
 No answer when called for treatment bed.

## 2024-07-17 NOTE — Discharge Instructions (Addendum)
 Send to [facility] ED for lab work (CBC w/diff, CMP, CD4 T cell abs, Helper T cell, Vitamin D)

## 2024-07-17 NOTE — ED Notes (Signed)
 Patient resting with eyes closed in no apparent acute distress. Respirations even and unlabored. Environment secured. Safety checks in place according to facility policy.

## 2024-07-17 NOTE — Group Note (Signed)
 Group Topic: Healthy Self Image and Positive Change  Group Date: 07/17/2024 Start Time: 1300 End Time: 1345 Facilitators: Alyse Leilani LABOR, NT  Department: [facility]  Number of Participants: 5  Group Focus: activities of daily living skills and communication Treatment Modality:  Skills Training Interventions utilized were group exercise Purpose: express feelings and improve communication skills  Name: Ethan Goodwin Date of Birth: July 28, 1991  MR: [id number]    Pt did not attend group Patients Problems:  Patient Active Problem List   Diagnosis Date Noted   Methamphetamine abuse (HCC) 07/15/2024   Substance induced mood disorder (HCC) 03/24/2024   Abscess of forearm, right 03/29/2021   Cellulitis of right hand 04/10/2020   Positive blood cultures 04/10/2020   IVDU (intravenous drug user)    HIV disease (HCC) 10/20/2019   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

## 2024-07-17 NOTE — ED Notes (Signed)

## 2024-07-17 NOTE — ED Triage Notes (Signed)
 Patient arrives today from [facility] requesting medical clearance. Hx of meth use. Anxiety.

## 2024-07-17 NOTE — ED Provider Notes (Signed)
 FBC/OBS ASAP Discharge Summary  Date and Time: 07/17/2024 6:08 PM  Name: Ethan Goodwin  MRN:  [id number]   Discharge Diagnoses:  Final diagnoses:  Methamphetamine abuse [facility])   Stay Summary: Pt presented to [facility] Ethan Goodwin 07/14/2024 as a voluntary walk-in, unaccompanied requesting substance abuse treatment and shelter. Pt reports experiencing homelessness and has hx of methamphtamine use. Pt reports using Meth over the weekend, but has only had marijuana within the last 24 hours. Pt reports diagnosis of anxiety. Pt was prescribed Prozac  and Risperdal  during his last admission to [facility], but has not followed up with medications since his discharge. Pt reports being established with [facility] for outpatient therapy, but has not began sessions at this time. Pt currently denies SI,HI,AVH and alcohol use.   Pt is seen face-to-face Ethan Goodwin the [facility]) unit. Pt is alert & oriented x 4, calm, and engages in evaluation. Today, pt states I'm ok. He endorses low mood and feel tired. He continues to endorse motivation to pursue outpatient substance use treatment and is interested in residential substance use treatment.  He acknowledges that he needs to finish completing application for Ethan Goodwin and states he plans to complete this today for consideration for residential treatment program. He participated in group yesterday evening. Patient was started Ethan Goodwin clindamycin yesterday for abscess to left lateral malleolus.  States he is tolerating clindamycin without adverse effects.  Abscess has not extended past the line of demarcation that was drawn by nursing staff yesterday afternoon.  States slept well overnight and has been eating well.  He denies suicidal or homicidal ideation, intent, or plan.  He denies AVH.  Patient remains under ongoing observation in the milieu to monitor safety, behavior and engagement with peers and staff.  At this time, he has an anticipated discharge of Tuesday  or Wednesday pending acceptance to a residential substance use treatment program.   1700 Pt has spoken with Ethan Goodwin and was advised that they need his CD4 count for consideration of admission. Pt's last labs were obtained in June, 2025. Pt was sent to the ED Ethan Goodwin 10/15 due to being a difficult stick and ED was not able to obtain labs.When asked how previous labs were obtained he advises that labs were obtained from his foot. Pt's extremities assessed for venipuncture to obtain labs. Pt has multiple healed injection sites to his bilateral upper extremities. Right foot assessed for possible venipuncture. Pt has visible vein to his right foot, however, declines to allow this practitioner to stick stating I would feel more comfortable at the hospital. Consulted with the [facility] Ethan Goodwin duty to determine if having a member of IV Team come to [facility] was an option, however, it is not. Pt will be sent to [facility] ED to obtain lab work.   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  Family History: family history is not Ethan Goodwin file.  Family Psychiatric  History: none reported Social History: Unhoused, unemployed. Tobacco Cessation:  Prescription not provided because: pt sent to ED  Current Medications:  Current Facility-Administered Medications  Medication Dose Route Frequency Provider Last Rate Last Admin   acetaminophen  (TYLENOL ) tablet 650 mg  650 mg Oral Q6H PRN Ethan Goodwin, Donovan, MD       alum & mag hydroxide-simeth (MAALOX/MYLANTA) 200-200-20 MG/5ML suspension 30 mL  30 mL Oral Q4H PRN Ethan Goodwin,  Patti, MD       bictegravir-emtricitabine-tenofovir AF (BIKTARVY ) 50-200-25 MG per tablet 1 tablet  1 tablet Oral Daily Ethan Goodwin Patti, MD   1 tablet at 07/17/24 0932   clindamycin (CLEOCIN) capsule 300 mg  300 mg Oral Q8H Ethan Goodwin Ethan Goodwin, Ethan Goodwin   300 mg  at 07/17/24 1427   haloperidol  (HALDOL ) tablet 5 mg  5 mg Oral TID PRN Ethan Goodwin Patti, MD       And   diphenhydrAMINE  (BENADRYL ) capsule 50 mg  50 mg Oral TID PRN Ethan Goodwin Patti, MD       haloperidol  lactate (HALDOL ) injection 5 mg  5 mg Intramuscular TID PRN Ethan Goodwin Patti, MD       And   diphenhydrAMINE  (BENADRYL ) injection 50 mg  50 mg Intramuscular TID PRN Ethan Goodwin Patti, MD       And   LORazepam  (ATIVAN ) injection 2 mg  2 mg Intramuscular TID PRN Ethan Goodwin Patti, MD       Ethan Goodwin Ethan Goodwin 07/18/2024] FLUoxetine  (PROZAC ) capsule 20 mg  20 mg Oral Once Ethan Goodwin Ethan Goodwin, Ethan Goodwin       hydrOXYzine  (ATARAX ) tablet 25 mg  25 mg Oral TID PRN Ethan Goodwin Patti, MD       magnesium  hydroxide (MILK OF MAGNESIA) suspension 30 mL  30 mL Oral Daily PRN Ethan Goodwin Patti, MD       nicotine  (NICODERM CQ  - dosed in mg/24 hours) patch 21 mg  21 mg Transdermal PRN Ethan Goodwin Patti, MD   21 mg at 07/17/24 1650   risperiDONE  (RISPERDAL ) tablet 1 mg  1 mg Oral BID Ethan Goodwin Patti, MD   1 mg at 07/17/24 0932   traZODone  (DESYREL ) tablet 50 mg  50 mg Oral QHS PRN Ethan Goodwin Patti, MD   50 mg at 07/16/24 2110   Current Outpatient Medications  Medication Sig Dispense Refill   bictegravir-emtricitabine-tenofovir AF (BIKTARVY ) 50-200-25 MG TABS tablet Take 1 tablet by mouth daily. 30 tablet 2   FLUoxetine  (PROZAC ) 10 MG capsule Take 1 capsule (10 mg total) by mouth daily. 30 capsule 0   risperiDONE  (RISPERDAL ) 1 MG tablet Take 1 tablet (1 mg total) by mouth 2 (two) times daily. 60 tablet 0   nicotine  (NICODERM CQ  - DOSED IN MG/24 HOURS) 21 mg/24hr patch Place 1 patch (21 mg total) onto the skin daily at 6 (six) AM. (Patient not taking: Reported Ethan Goodwin 07/14/2024) 28 patch 0   traZODone  (DESYREL ) 50 MG tablet Take 1 tablet (50 mg total) by mouth at bedtime as needed for sleep. (Patient not taking: Reported Ethan Goodwin 07/14/2024) 30 tablet 0    PTA Medications:  Facility Ordered Medications  Medication   acetaminophen  (TYLENOL ) tablet 650  mg   alum & mag hydroxide-simeth (MAALOX/MYLANTA) 200-200-20 MG/5ML suspension 30 mL   magnesium  hydroxide (MILK OF MAGNESIA) suspension 30 mL   haloperidol  (HALDOL ) tablet 5 mg   And   diphenhydrAMINE  (BENADRYL ) capsule 50 mg   haloperidol  lactate (HALDOL ) injection 5 mg   And   diphenhydrAMINE  (BENADRYL ) injection 50 mg   And   LORazepam  (ATIVAN ) injection 2 mg   hydrOXYzine  (ATARAX ) tablet 25 mg   traZODone  (DESYREL ) tablet 50 mg   nicotine  (NICODERM CQ  - dosed in mg/24 hours) patch 21 mg   bictegravir-emtricitabine-tenofovir AF (BIKTARVY ) 50-200-25 MG per tablet 1 tablet   risperiDONE  (RISPERDAL ) tablet 1 mg   clindamycin (CLEOCIN) capsule 300 mg   [COMPLETED] FLUoxetine  (PROZAC ) capsule 10 mg   [START Ethan Goodwin 07/18/2024] FLUoxetine  (PROZAC ) capsule 20 mg   PTA Medications  Medication Sig   FLUoxetine  (PROZAC ) 10 MG capsule Take 1 capsule (10 mg total) by mouth daily.   risperiDONE  (RISPERDAL ) 1 MG tablet Take 1 tablet (1 mg total) by mouth 2 (two) times daily.   bictegravir-emtricitabine-tenofovir AF (BIKTARVY ) 50-200-25 MG TABS tablet Take 1 tablet by mouth daily.   nicotine  (NICODERM CQ  - DOSED IN MG/24 HOURS) 21 mg/24hr patch Place 1 patch (21 mg total) onto the skin daily at 6 (six) AM. (Patient not taking: Reported Ethan Goodwin 07/14/2024)   traZODone  (DESYREL ) 50 MG tablet Take 1 tablet (50 mg total) by mouth at bedtime as needed for sleep. (Patient not taking: Reported Ethan Goodwin 07/14/2024)       07/16/2024   11:00 AM 03/27/2024    8:36 AM 03/24/2024   11:02 AM  Depression screen PHQ 2/9  Decreased Interest 3 0 1  Down, Depressed, Hopeless 3 0 1  PHQ - 2 Score 6 0 2  Altered sleeping 1 0 2  Tired, decreased energy 1 0 2  Change in appetite 1 0 2  Feeling bad or failure about yourself  0 0 2  Trouble concentrating 0 0 2  Moving slowly or fidgety/restless 1 0 2  Suicidal thoughts 0 0 2  PHQ-9 Score 10 0 16  Difficult doing work/chores Somewhat difficult Not difficult at all      Flowsheet Row ED from 07/15/2024 in [facility] Most recent reading at 07/15/2024 11:54 AM ED from 07/14/2024 in [facility] Most recent reading at 07/14/2024 10:52 PM ED from 07/14/2024 in [facility] Emergency Department at [facility] Most recent reading at 07/14/2024  4:37 PM  C-SSRS RISK CATEGORY No Risk No Risk No Risk    Musculoskeletal  Strength & Muscle Tone: within normal limits Gait & Station: normal Patient leans: N/A  Psychiatric Specialty Exam  Presentation  General Appearance:  Appropriate for Environment; Fairly Groomed  Eye Contact: Good  Speech: Clear and Coherent; Normal Rate  Speech Volume: Normal  Handedness: Right   Mood and Affect  Mood: Depressed  Affect: Congruent   Thought Process  Thought Processes: Coherent  Descriptions of Associations:Intact  Orientation:Full (Time, Place and Person)  Thought Content:Logical  Diagnosis of Schizophrenia or Schizoaffective disorder in past: No  Duration of Psychotic Symptoms: Greater than six months   Hallucinations:Hallucinations: None  Ideas of Reference:None  Suicidal Thoughts:Suicidal Thoughts: No  Homicidal Thoughts:Homicidal Thoughts: No   Sensorium  Memory: Recent Good; Remote Good  Judgment: Fair  Insight: Fair   Art therapist  Concentration: Good  Attention Span: Good  Recall: Good  Fund of Knowledge: Good  Language: Good   Psychomotor Activity  Psychomotor Activity: Psychomotor Activity: Normal   Assets  Assets: Communication Skills; Desire for Improvement; Resilience   Sleep  Sleep: Sleep: Fair  Estimated Sleeping Duration (Last 24 Hours): 15.50-16.75 hours  No data recorded  Physical Exam  Physical Exam Vitals and nursing note reviewed.  HENT:     Head: Normocephalic.     Mouth/Throat:     Mouth: Mucous membranes are moist.  Cardiovascular:     Rate and  Rhythm: Normal rate.  Pulmonary:     Effort: Pulmonary effort is normal.  Musculoskeletal:        General: Normal range of motion.  Skin:    General: Skin is warm and dry.  Neurological:     Mental Status: He is alert and oriented to person, place, and time.  Psychiatric:  Comments: See HPI     Review of Systems  Constitutional:  Negative for fever.  HENT:  Negative for congestion.   Respiratory:  Negative for shortness of breath.   Cardiovascular:  Negative for chest pain and palpitations.  Gastrointestinal:  Negative for constipation, nausea and vomiting.  Psychiatric/Behavioral:  Positive for depression and substance abuse. Negative for hallucinations and suicidal ideas.    Blood pressure 105/81, pulse 92, temperature 98.6 F (37 C), resp. rate 18, SpO2 100%. There is no height or weight Ethan Goodwin file to calculate BMI.  Demographic Factors:  Male and Low socioeconomic status   Suicide Risk:  Minimal: No identifiable suicidal ideation.  Patients presenting with no risk factors but with morbid ruminations; may be classified as minimal risk based Ethan Goodwin the severity of the depressive symptoms  Disposition:Pt will be sent to [facility] ED for lab work.   Sherrell Culver, PMHNP-BC, FNP-BC  07/17/2024, 6:08 PM

## 2024-07-17 NOTE — ED Provider Notes (Signed)
 [place] EMERGENCY DEPARTMENT AT [facility] Provider Note  CSN: [id number] Arrival date & time: 07/17/24 1858  Chief Complaint(s) Medical Clearance  HPI Ethan Goodwin is a 33 y.o. male history of HIV, currently at facility based crisis for IV drug use presenting for lab drawl.  They were unable to draw any labs at the crisis center.  He reports overall he feels totally fine and denies any chest pain, shortness of breath, abdominal pain, nausea, vomiting.  He denies any suicidal or homicidal ideation.  He is willing to let us  draw labs here.   Past Medical History Past Medical History:  Diagnosis Date   HIV disease (HCC) 10/20/2019   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   Patient Active Problem List   Diagnosis Date Noted   Methamphetamine abuse (HCC) 07/15/2024   Substance induced mood disorder (HCC) 03/24/2024   Abscess of forearm, right 03/29/2021   Cellulitis of right hand 04/10/2020   Positive blood cultures 04/10/2020   IVDU (intravenous drug user)    HIV disease (HCC) 10/20/2019   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   Home Medication(s) Prior to Admission medications   Medication Sig Start Date End Date Taking? Authorizing Provider  bictegravir-emtricitabine-tenofovir AF (BIKTARVY ) 50-200-25 MG TABS tablet Take 1 tablet by mouth daily. 04/20/24   Waddell Alan PARAS, RPH-CPP  FLUoxetine  (PROZAC ) 10 MG capsule Take 1 capsule (10 mg total) by mouth daily. 03/27/24   Lenard Calin, MD  nicotine  (NICODERM CQ  - DOSED IN MG/24 HOURS) 21 mg/24hr patch Place 1 patch (21 mg total) onto the skin daily at 6 (six) AM. Patient not taking: Reported on 07/14/2024 03/27/24   Lenard Calin, MD  risperiDONE  (RISPERDAL ) 1 MG tablet Take 1 tablet (1 mg total) by mouth 2 (two) times daily. 03/26/24   Lenard Calin, MD  traZODone  (DESYREL ) 50 MG tablet Take 1 tablet (50 mg total) by mouth at bedtime as needed for sleep. Patient not taking: Reported on 07/14/2024 03/26/24   Lenard Calin, MD                                                                                                                                     Past Surgical History Past Surgical History:  Procedure Laterality Date   FACIAL COSMETIC SURGERY     Family History No family history on file.  Social History Social History   Tobacco Use   Smoking status: Every Day    Types: Cigarettes   Smokeless tobacco: Never  Vaping Use   Vaping status: Never Used  Substance Use Topics   Alcohol use: Not Currently   Drug use: Not Currently    Types: IV, Methamphetamines, Marijuana    Comment: last use of marijuana was 2 days ago   Allergies Patient has no known allergies.  Review of Systems Review of Systems  All other systems reviewed and are negative.   Physical Exam Vital Signs  I have reviewed the  triage vital signs BP 118/72 (BP Location: Right Arm)   Pulse 70   Temp 98 F (36.7 C) (Oral)   Resp 18   SpO2 100%  Physical Exam Vitals and nursing note reviewed.  Constitutional:      General: He is not in acute distress.    Appearance: Normal appearance.  HENT:     Head: Normocephalic and atraumatic.     Mouth/Throat:     Mouth: Mucous membranes are moist.  Eyes:     Conjunctiva/sclera: Conjunctivae normal.  Cardiovascular:     Rate and Rhythm: Normal rate.  Pulmonary:     Effort: Pulmonary effort is normal. No respiratory distress.  Abdominal:     General: Abdomen is flat.  Skin:    General: Skin is warm and dry.     Capillary Refill: Capillary refill takes less than 2 seconds.  Neurological:     General: No focal deficit present.     Mental Status: He is alert. Mental status is at baseline.  Psychiatric:        Mood and Affect: Mood normal.        Behavior: Behavior normal.     ED Results and Treatments Labs (all labs ordered are listed, but only abnormal results are displayed) Labs Reviewed  CBC WITH DIFFERENTIAL/PLATELET  COMPREHENSIVE METABOLIC PANEL WITH GFR  T-HELPER  CELLS (CD4) COUNT (NOT AT [facility])  CD4/CD8 (T-HELPER/T-SUPPRESSOR CELL)  VITAMIN D 25 HYDROXY (VIT D DEFICIENCY, FRACTURES)                                                                                                                          Radiology No results found.  Pertinent labs & imaging results that were available during my care of the patient were reviewed by me and considered in my medical decision making (see MDM for details).  Medications Ordered in ED Medications - No data to display                                                                                                                                   Procedures Procedures  (including critical care time)  Medical Decision Making / ED Course   MDM:  33 year old presenting to the emergency department for lab draw.  Patient overall well-appearing, denies any symptoms.  Initially patient was willing to stay for lab draw.  Nurse tech, few times unable to get  blood.  Patient ate a sandwich and then reported that he was feeling better and left.  I did not talk to patient prior to his elopement.  He is not endorsing any psychiatric symptoms or medical symptoms.      Co morbidities that complicate the patient evaluation  Past Medical History:  Diagnosis Date   HIV disease (HCC) 10/20/2019   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      Dispostion: Disposition decision including need for hospitalization was considered, and patient Eloped    Final Clinical Impression(s) / ED Diagnoses Final diagnoses:  Encounter for blood test  Eloped from emergency department     This chart was dictated using voice recognition software.  Despite best efforts to proofread,  errors can occur which can change the documentation meaning.    Francesca Elsie CROME, MD 07/17/24 2203

## 2024-07-17 NOTE — ED Notes (Signed)
 Patient approached this nurse stating he does not want to be stuck anymore and going to leave, provider made aware.

## 2024-07-17 NOTE — ED Notes (Signed)
 Urine sent to lab

## 2024-07-17 NOTE — ED Provider Notes (Signed)
 Facility Based Crisis Behavioral Health Progress Note  Date and Time: 07/17/2024 0944 Name: Ethan Goodwin MRN:  [id number]  Subjective: I'm ok   Diagnosis:  Final diagnoses:  Methamphetamine abuse (HCC)   Pt is seen face-to-face on the Facility Based Crisis [facility]) unit. Pt is alert & oriented x 4, calm, and engages in evaluation. Today, pt states I'm ok. He endorses low mood and feel tired. He continues to endorse motivation to pursue outpatient substance use treatment and is interested in residential substance use treatment.  He acknowledges that he needs to finish completing application for [facility] and states he plans to complete this today for consideration for residential treatment program. He participated in group yesterday evening. Patient was started on clindamycin yesterday for abscess to left lateral malleolus.  States he is tolerating clindamycin without adverse effects.  Abscess has not extended past the line of demarcation that was drawn by nursing staff yesterday afternoon.  States slept well overnight and has been eating well.  He denies suicidal or homicidal ideation, intent, or plan.  He denies AVH.  Patient remains under ongoing observation in the milieu to monitor safety, behavior and engagement with peers and staff.  At this time, he has an anticipated discharge of Tuesday or Wednesday pending acceptance to a residential substance use treatment program.  1700 Pt has spoken with [facility] and was advised that they need his CD4 count for consideration of admission. Pt's last labs were obtained in June, 2025. Pt was sent to the ED on 10/15 due to being a difficult stick and ED was not able to obtain labs.When asked how previous labs were obtained he advises that labs were obtained from his foot. Pt's extremities assessed for venipuncture to obtain labs. Pt has multiple healed injection sites to his bilateral upper extremities. Right foot assessed for possible venipuncture. Pt has  visible vein to his right foot, however, declines to allow this practitioner to stick stating I would feel more comfortable at the hospital. Consulted with the [facility] on duty to determine if having a member of IV Team come to [facility] was an option, however, it is not.   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  Family History: family history is not on file.  Family Psychiatric  History: none reported Social History: Unhoused, unemployed.   Sleep: Good  Appetite:  Good  Current Medications:  Current Facility-Administered Medications  Medication Dose Route Frequency Provider Last Rate Last Admin   acetaminophen  (TYLENOL ) tablet 650 mg  650 mg Oral Q6H PRN Rainey, Donovan, MD       alum & mag hydroxide-simeth (MAALOX/MYLANTA) 200-200-20 MG/5ML suspension 30 mL  30 mL Oral Q4H PRN Lenard Calin, MD       bictegravir-emtricitabine-tenofovir AF (BIKTARVY ) 50-200-25 MG per tablet 1 tablet  1 tablet Oral Daily Lenard Calin, MD   1 tablet at 07/17/24 0932   clindamycin (CLEOCIN) capsule 300 mg  300 mg Oral Q8H Nole Robey E, NP   300 mg at 07/17/24 1427   haloperidol  (HALDOL ) tablet 5 mg  5 mg Oral TID PRN Lenard Calin, MD       And   diphenhydrAMINE  (BENADRYL ) capsule 50 mg  50 mg Oral TID PRN Lenard Calin, MD       haloperidol  lactate (HALDOL ) injection 5 mg  5 mg Intramuscular TID PRN Lenard Calin, MD  And   diphenhydrAMINE  (BENADRYL ) injection 50 mg  50 mg Intramuscular TID PRN Lenard Calin, MD       And   LORazepam  (ATIVAN ) injection 2 mg  2 mg Intramuscular TID PRN Lenard Calin, MD       [START ON 07/18/2024] FLUoxetine  (PROZAC ) capsule 20 mg  20 mg Oral Once Seaira Byus E, NP       hydrOXYzine  (ATARAX ) tablet 25 mg  25 mg Oral TID PRN Lenard Calin, MD       magnesium  hydroxide  (MILK OF MAGNESIA) suspension 30 mL  30 mL Oral Daily PRN Lenard Calin, MD       nicotine  (NICODERM CQ  - dosed in mg/24 hours) patch 21 mg  21 mg Transdermal PRN Lenard Calin, MD   21 mg at 07/17/24 1650   risperiDONE  (RISPERDAL ) tablet 1 mg  1 mg Oral BID Lenard Calin, MD   1 mg at 07/17/24 0932   traZODone  (DESYREL ) tablet 50 mg  50 mg Oral QHS PRN Lenard Calin, MD   50 mg at 07/16/24 2110   Current Outpatient Medications  Medication Sig Dispense Refill   bictegravir-emtricitabine-tenofovir AF (BIKTARVY ) 50-200-25 MG TABS tablet Take 1 tablet by mouth daily. 30 tablet 2   FLUoxetine  (PROZAC ) 10 MG capsule Take 1 capsule (10 mg total) by mouth daily. 30 capsule 0   risperiDONE  (RISPERDAL ) 1 MG tablet Take 1 tablet (1 mg total) by mouth 2 (two) times daily. 60 tablet 0   nicotine  (NICODERM CQ  - DOSED IN MG/24 HOURS) 21 mg/24hr patch Place 1 patch (21 mg total) onto the skin daily at 6 (six) AM. (Patient not taking: Reported on 07/14/2024) 28 patch 0   traZODone  (DESYREL ) 50 MG tablet Take 1 tablet (50 mg total) by mouth at bedtime as needed for sleep. (Patient not taking: Reported on 07/14/2024) 30 tablet 0    Labs  Lab Results:  Admission on 07/14/2024, Discharged on 07/14/2024  Component Date Value Ref Range Status   POC Amphetamine UR 07/14/2024 Positive (A)  NONE DETECTED (Cut Off Level 1000 ng/mL) Final   POC Secobarbital (BAR) 07/14/2024 None Detected  NONE DETECTED (Cut Off Level 300 ng/mL) Final   POC Buprenorphine (BUP) 07/14/2024 None Detected  NONE DETECTED (Cut Off Level 10 ng/mL) Final   POC Oxazepam (BZO) 07/14/2024 None Detected  NONE DETECTED (Cut Off Level 300 ng/mL) Final   POC Cocaine UR 07/14/2024 None Detected  NONE DETECTED (Cut Off Level 300 ng/mL) Final   POC Methamphetamine UR 07/14/2024 Positive (A)  NONE DETECTED (Cut Off Level 1000 ng/mL) Final   POC Morphine  07/14/2024 None Detected  NONE DETECTED (Cut Off Level 300 ng/mL) Final   POC Methadone UR  07/14/2024 None Detected  NONE DETECTED (Cut Off Level 300 ng/mL) Final   POC Oxycodone  UR 07/14/2024 None Detected  NONE DETECTED (Cut Off Level 100 ng/mL) Final   POC Marijuana UR 07/14/2024 None Detected  NONE DETECTED (Cut Off Level 50 ng/mL) Final  Admission on 06/21/2024, Discharged on 06/21/2024  Component Date Value Ref Range Status   Specimen Description 06/21/2024 BLOOD LEFT ANTECUBITAL   Final   Special Requests 06/21/2024 Blood Culture results may not be optimal due to an inadequate volume of blood received in culture bottles BOTTLES DRAWN AEROBIC ONLY   Final   Culture 06/21/2024    Final                   Value:NO GROWTH 5 DAYS Performed at [facility]  Lab, [address] [place], [place] [address]    Report Status 06/21/2024 06/26/2024 FINAL   Final  Admission on 06/01/2024, Discharged on 06/01/2024  Component Date Value Ref Range Status   SARS Coronavirus 2 by RT PCR 06/01/2024 NEGATIVE  NEGATIVE Final   Influenza A by PCR 06/01/2024 NEGATIVE  NEGATIVE Final   Influenza B by PCR 06/01/2024 NEGATIVE  NEGATIVE Final   Comment: (NOTE) The Xpert Xpress SARS-CoV-2/FLU/RSV plus assay is intended as an aid in the diagnosis of influenza from Nasopharyngeal swab specimens and should not be used as a sole basis for treatment. Nasal washings and aspirates are unacceptable for Xpert Xpress SARS-CoV-2/FLU/RSV testing.  Fact Sheet for Patients: BloggerCourse.com  Fact Sheet for Healthcare Providers: SeriousBroker.it  This test is not yet approved or cleared by the [place]  FDA and has been authorized for detection and/or diagnosis of SARS-CoV-2 by FDA under an Emergency Use Authorization (EUA). This EUA will remain in effect (meaning this test can be used) for the duration of the COVID-19 declaration under Section 564(b)(1) of the Act, 21 U.S.C. section 360bbb-3(b)(1), unless the authorization is terminated  or revoked.     Resp Syncytial Virus by PCR 06/01/2024 NEGATIVE  NEGATIVE Final   Comment: (NOTE) Fact Sheet for Patients: BloggerCourse.com  Fact Sheet for Healthcare Providers: SeriousBroker.it  This test is not yet approved or cleared by the [place]  FDA and has been authorized for detection and/or diagnosis of SARS-CoV-2 by FDA under an Emergency Use Authorization (EUA). This EUA will remain in effect (meaning this test can be used) for the duration of the COVID-19 declaration under Section 564(b)(1) of the Act, 21 U.S.C. section 360bbb-3(b)(1), unless the authorization is terminated or revoked.  Performed at [facility], [address] [place], [place] [address]   Office Visit on 04/20/2024  Component Date Value Ref Range Status   Neisseria Gonorrhea 04/20/2024 Negative   Final   Chlamydia 04/20/2024 Negative   Final   Comment 04/20/2024 Normal Reference Ranger Chlamydia - Negative   Final   Comment 04/20/2024 Normal Reference Range Neisseria Gonorrhea - Negative   Final   Neisseria Gonorrhea 04/20/2024 Negative   Final   Chlamydia 04/20/2024 Negative   Final   Comment 04/20/2024 Normal Reference Ranger Chlamydia - Negative   Final   Comment 04/20/2024 Normal Reference Range Neisseria Gonorrhea - Negative   Final  Admission on 03/24/2024, Discharged on 03/27/2024  Component Date Value Ref Range Status   WBC 03/24/2024 6.3  4.0 - 10.5 K/uL Final   RBC 03/24/2024 4.37  4.22 - 5.81 MIL/uL Final   Hemoglobin 03/24/2024 13.8  13.0 - 17.0 g/dL Final   HCT 93/74/7974 41.9  39.0 - 52.0 % Final   MCV 03/24/2024 95.9  80.0 - 100.0 fL Final   MCH 03/24/2024 31.6  26.0 - 34.0 pg Final   MCHC 03/24/2024 32.9  30.0 - 36.0 g/dL Final   RDW 93/74/7974 13.1  11.5 - 15.5 % Final   Platelets 03/24/2024 201  150 - 400 K/uL Final   nRBC 03/24/2024 0.0  0.0 - 0.2 % Final   Neutrophils Relative % 03/24/2024 58  % Final   Neutro  Abs 03/24/2024 3.6  1.7 - 7.7 K/uL Final   Lymphocytes Relative 03/24/2024 33  % Final   Lymphs Abs 03/24/2024 2.1  0.7 - 4.0 K/uL Final   Monocytes Relative 03/24/2024 7  % Final   Monocytes Absolute 03/24/2024 0.4  0.1 - 1.0 K/uL Final  Eosinophils Relative 03/24/2024 1  % Final   Eosinophils Absolute 03/24/2024 0.1  0.0 - 0.5 K/uL Final   Basophils Relative 03/24/2024 0  % Final   Basophils Absolute 03/24/2024 0.0  0.0 - 0.1 K/uL Final   Immature Granulocytes 03/24/2024 1  % Final   Abs Immature Granulocytes 03/24/2024 0.06  0.00 - 0.07 K/uL Final   Performed at [facility], [address] [place], [place] [address]   Sodium 03/24/2024 139  135 - 145 mmol/L Final   Potassium 03/24/2024 3.8  3.5 - 5.1 mmol/L Final   Chloride 03/24/2024 107  98 - 111 mmol/L Final   CO2 03/24/2024 23  22 - 32 mmol/L Final   Glucose, Bld 03/24/2024 100 (H)  70 - 99 mg/dL Final   Glucose reference range applies only to samples taken after fasting for at least 8 hours.   BUN 03/24/2024 16  6 - 20 mg/dL Final   Creatinine, Ser 03/24/2024 0.98  0.61 - 1.24 mg/dL Final   Calcium 93/74/7974 9.3  8.9 - 10.3 mg/dL Final   Total Protein 93/74/7974 7.9  6.5 - 8.1 g/dL Final   Albumin 93/74/7974 3.7  3.5 - 5.0 g/dL Final   AST 93/74/7974 23  15 - 41 U/L Final   ALT 03/24/2024 19  0 - 44 U/L Final   Alkaline Phosphatase 03/24/2024 87  38 - 126 U/L Final   Total Bilirubin 03/24/2024 1.4 (H)  0.0 - 1.2 mg/dL Final   GFR, Estimated 03/24/2024 >60  >60 mL/min Final   Comment: (NOTE) Calculated using the CKD-EPI Creatinine Equation (2021)    Anion gap 03/24/2024 9  5 - 15 Final   Performed at [facility], [address] [place], [place] [address]   Hgb A1c MFr Bld 03/24/2024 5.3  4.8 - 5.6 % Final   Comment: (NOTE) Diagnosis of Diabetes The following HbA1c ranges recommended by the American Diabetes Association (ADA) may be used as an aid in the diagnosis of diabetes mellitus.  Hemoglobin              Suggested A1C NGSP%              Diagnosis  <5.7                   Non Diabetic  5.7-6.4                Pre-Diabetic  >6.4                   Diabetic  <7.0                   Glycemic control for                       adults with diabetes.     Mean Plasma Glucose 03/24/2024 105.41  mg/dL Final   Performed at [facility], [address] [place], [place] [address]   Alcohol, Ethyl (B) 03/24/2024 <15  <15 mg/dL Final   Comment: (NOTE) For medical purposes only. Performed at [facility], [address] [place], [place] [address]    Cholesterol 03/24/2024 127  0 - 200 mg/dL Final   Triglycerides 93/74/7974 49  <150 mg/dL Final   HDL 93/74/7974 54  >40 mg/dL Final   Total CHOL/HDL Ratio 03/24/2024 2.4  RATIO Final   VLDL 03/24/2024 10  0 - 40 mg/dL Final  LDL Cholesterol 03/24/2024 63  0 - 99 mg/dL Final   Comment:        Total Cholesterol/HDL:CHD Risk Coronary Heart Disease Risk Table                     Men   Women  1/2 Average Risk   3.4   3.3  Average Risk       5.0   4.4  2 X Average Risk   9.6   7.1  3 X Average Risk  23.4   11.0        Use the calculated Patient Ratio above and the CHD Risk Table to determine the patient's CHD Risk.        ATP III CLASSIFICATION (LDL):  <100     mg/dL   Optimal  899-870  mg/dL   Near or Above                    Optimal  130-159  mg/dL   Borderline  839-810  mg/dL   High  >809     mg/dL   Very High Performed at [facility], [address], [place], [place] [address]    TSH 03/24/2024 1.305  0.350 - 4.500 uIU/mL Final   Comment: Performed by a 3rd Generation assay with a functional sensitivity of <=0.01 uIU/mL. Performed at [facility], [address] [place], [place] [address]    Color, Urine 03/24/2024 AMBER (A)  YELLOW Final   BIOCHEMICALS MAY BE AFFECTED BY COLOR   APPearance 03/24/2024 CLEAR  CLEAR Final   Specific Gravity, Urine 03/24/2024 1.030  1.005 - 1.030 Final   pH 03/24/2024 5.0   5.0 - 8.0 Final   Glucose, UA 03/24/2024 NEGATIVE  NEGATIVE mg/dL Final   Hgb urine dipstick 03/24/2024 NEGATIVE  NEGATIVE Final   Bilirubin Urine 03/24/2024 NEGATIVE  NEGATIVE Final   Ketones, ur 03/24/2024 NEGATIVE  NEGATIVE mg/dL Final   Protein, ur 93/74/7974 NEGATIVE  NEGATIVE mg/dL Final   Nitrite 93/74/7974 NEGATIVE  NEGATIVE Final   Leukocytes,Ua 03/24/2024 NEGATIVE  NEGATIVE Final   Performed at [facility], [address] [place], [place] [address]   POC Amphetamine UR 03/24/2024 Positive (A)  NONE DETECTED (Cut Off Level 1000 ng/mL) Final   POC Secobarbital (BAR) 03/24/2024 None Detected  NONE DETECTED (Cut Off Level 300 ng/mL) Final   POC Buprenorphine (BUP) 03/24/2024 None Detected  NONE DETECTED (Cut Off Level 10 ng/mL) Final   POC Oxazepam (BZO) 03/24/2024 None Detected  NONE DETECTED (Cut Off Level 300 ng/mL) Final   POC Cocaine UR 03/24/2024 None Detected  NONE DETECTED (Cut Off Level 300 ng/mL) Final   POC Methamphetamine UR 03/24/2024 Positive (A)  NONE DETECTED (Cut Off Level 1000 ng/mL) Final   POC Morphine  03/24/2024 None Detected  NONE DETECTED (Cut Off Level 300 ng/mL) Final   POC Methadone UR 03/24/2024 None Detected  NONE DETECTED (Cut Off Level 300 ng/mL) Final   POC Oxycodone  UR 03/24/2024 None Detected  NONE DETECTED (Cut Off Level 100 ng/mL) Final   POC Marijuana UR 03/24/2024 None Detected  NONE DETECTED (Cut Off Level 50 ng/mL) Final    Blood Alcohol level:  Lab Results  Component Value Date   [employer] <15 03/24/2024    Metabolic Disorder Labs: Lab Results  Component Value Date   HGBA1C 5.3 03/24/2024   MPG 105.41 03/24/2024   No results found for: PROLACTIN Lab Results  Component Value  Date   CHOL 127 03/24/2024   TRIG 49 03/24/2024   HDL 54 03/24/2024   CHOLHDL 2.4 03/24/2024   VLDL 10 03/24/2024   LDLCALC 63 03/24/2024   LDLCALC 68 08/03/2021    Therapeutic Lab Levels: No results found for: LITHIUM No results found for:  VALPROATE No results found for: CBMZ  Physical Findings   PHQ2-9    Flowsheet Row ED from 07/15/2024 in [facility] ED from 03/24/2024 in [facility] Office Visit from 06/09/2023 in [facility] Of [place]. [facility] Office Visit from 07/03/2021 in [facility] - [facility] Dept Of [place]. [facility] Office Visit from 03/29/2021 in [facility] - A Dept Of [place]. [facility]  PHQ-2 Total Score 6 0 0 0 2  PHQ-9 Total Score 10 0 -- -- 4   Flowsheet Row ED from 07/15/2024 in [facility] Most recent reading at 07/15/2024 11:54 AM ED from 07/14/2024 in [facility] Most recent reading at 07/14/2024 10:52 PM ED from 07/14/2024 in [facility] Emergency Department at [facility] Most recent reading at 07/14/2024  4:37 PM  C-SSRS RISK CATEGORY No Risk No Risk No Risk     Musculoskeletal  Strength & Muscle Tone: within normal limits Gait & Station: normal Patient leans: N/A  Psychiatric Specialty Exam  Presentation  General Appearance:  Appropriate for Environment; Fairly Groomed  Eye Contact: Good  Speech: Clear and Coherent; Normal Rate  Speech Volume: Normal  Handedness: Right   Mood and Affect  Mood: Depressed  Affect: Congruent   Thought Process  Thought Processes: Coherent  Descriptions of Associations:Intact  Orientation:Full (Time, Place and Person)  Thought Content:Logical  Diagnosis of Schizophrenia or Schizoaffective disorder in past: No  Duration of Psychotic Symptoms: Greater than six months   Hallucinations:Hallucinations: None  Ideas of Reference:None  Suicidal Thoughts:Suicidal Thoughts: No  Homicidal Thoughts:Homicidal Thoughts: No   Sensorium  Memory: Recent Good; Remote Good  Judgment: Fair  Insight: Fair   Restaurant manager, fast food  Concentration: Good  Attention Span: Good  Recall: Good  Fund of Knowledge: Good  Language: Good   Psychomotor Activity  Psychomotor Activity: Psychomotor Activity: Normal   Assets  Assets: Communication Skills; Desire for Improvement; Resilience   Sleep  Sleep: Sleep: Fair  Estimated Sleeping Duration (Last 24 Hours): 15.50-16.75 hours  No data recorded  Physical Exam  Physical Exam Vitals and nursing note reviewed.  HENT:     Head: Normocephalic.     Mouth/Throat:     Mouth: Mucous membranes are moist.  Cardiovascular:     Rate and Rhythm: Normal rate.  Pulmonary:     Effort: Pulmonary effort is normal.  Musculoskeletal:        General: Normal range of motion.  Skin:    General: Skin is warm and dry.  Neurological:     Mental Status: He is alert and oriented to person, place, and time.  Psychiatric:     Comments: See HPI    Review of Systems  Constitutional:  Negative for fever.  HENT:  Negative for congestion.   Respiratory:  Negative for shortness of breath.   Cardiovascular:  Negative for chest pain and palpitations.  Gastrointestinal:  Negative for constipation, nausea and vomiting.  Psychiatric/Behavioral:  Positive for depression and substance abuse. Negative for hallucinations and suicidal ideas.  Blood pressure 105/81, pulse 92, temperature 98.6 F (37 C), resp. rate 18, SpO2 100%. There is no height or weight on file to calculate BMI.  Treatment Plan Summary: Daily contact with patient to assess and evaluate symptoms and progress in treatment  Medication management Agitation protocol Continue Biktarvy  50-200-25 mg PO every day Continue Prozac  10 mg PO QAM Continue Risperdal  1 mg PO BID Continue trazodone  50 mg PO at bedtime prn sleep Continue clindamycin 300 mg PO TID - antibiotic therapy will end on 07/26/2024  Plan Care coordination to assist with outpatient/residential SUD treatment as appropriate.   Send to  [facility] ED for lab work (CBC w/diff, CMP, CD4 T cell abs, Helper T cell, Vitamin D).  Sherrell Culver, PMHNP-BC, FNP-BC  07/17/2024 6:04 PM

## 2024-07-17 NOTE — ED Notes (Signed)
 Pt is in his room sleeping. No acute distress noted. Respirations are even and labored. Q15 safety checks in place.

## 2024-07-17 NOTE — ED Notes (Addendum)
 Pt is sleeping at the moment. No acute distress noted. Q15 safety checks in place.

## 2024-07-17 NOTE — ED Notes (Signed)
 Patient in hallway on phone to complete [facility] application. No acute distress noted. No concerns voiced. No inappropriate behaviors observed or reported at this time. Informed patient to notify staff with any needs or assistance. Patient verbalized understanding or agreement. Safety checks in place per facility policy.

## 2024-07-17 NOTE — ED Notes (Signed)
 Patient transferred to [facility] for bloodwork. Patient trasnferred in no acute distress, A& O x4 and ambulatory. Patient denied SI/HI, A/VH upon discharge. Patient verbalized understanding of transfer. Patient mood fair. Patient belongings returned to patient from locker #11 complete and intact. Patient escorted to back sallyport via staff for transport to destination. Safety maintained.

## 2024-07-18 ENCOUNTER — Ambulatory Visit (HOSPITAL_COMMUNITY): Admission: EM | Admit: 2024-07-18 | Discharge: 2024-07-18 | Disposition: A | Discharge status: Home / Self Care

## 2024-07-18 DIAGNOSIS — F1510 Other stimulant abuse, uncomplicated: Secondary | ICD-10-CM

## 2024-07-18 NOTE — Discharge Instructions (Signed)
Substance Abuse Resources  [facility] - Admissions are currently completed Monday through Friday at [place]; both appointments and walk-ins are accepted.  Any individual that is a [facility] resident may present for a substance abuse screening and assessment for admission.  A person may be referred by numerous sources or self-refer.   Potential clients will be screened for medical necessity and appropriateness for the program.  Clients must meet criteria for high-intensity residential treatment services.  If clinically appropriate, a client will continue with the comprehensive clinical assessment and intake process, as well as enrollment in the [place].   Address: [address] [place], [place] [address] Admin Hours: Mon-Fri 8AM to [place] Hours: 24/7 Phone: [phone] Fax: [phone]   [facility] (Detox) Facility Based Crisis:  These are 3 locations for services: Please call before arrival    Address: [address] [place], [place] [address] Phone: [phone]   Address: [address] [place],  [place] [address] Phone#: [phone]   Address: [address] [place], [place] [address] Phone#: [phone]     [facility] ([facility]): (offers outpatient therapy and intensive outpatient substance abuse therapy).  [address] [place], [place] [address] Phone: [phone]   [place]: Offers FREE recovery skills classes, support groups, 1:1 Peer Support, and Compeer Classes. [address] [place], [place] [address] Phone: [phone] (Call to complete intake).  [facility] [address] [place], [place] [address] Phone: [phone] ext: [facility] provides food, shelter and other programs and services to the homeless men of [place] through our [employer].   By offering safe shelter, three meals a day,  clean clothing, Biblical counseling, financial planning, vocational training, GED/education and employment assistance, we've helped mend the shattered lives of many homeless men since opening in 1974.   We have approximately 267 beds available, with a max of 312 beds including mats for emergency situations and currently house an average of 270 men a night.   Prospective Client Check-In Information Photo ID Required (State/ Out of State/ [facility] [place]) - if photo ID is not available, clients are required to have a printout of a police/sheriff's criminal history report. Help out with chores around the [place]. No sex offender of any type (pending, charged, registered and/or any other sex related offenses) will be permitted to check in. Must be willing to abide by all rules, regulations, and policies established by the [employer]. The following will be provided - shelter, food, clothing, and biblical counseling. If you or someone you know is in need of assistance at our [facility] shelter in [place], [place], please call [phone] ext. [phone].   [facility]-will provide timely access to mental health services for children and adolescents (4-17) and adults presenting in a mental health crisis. The program is designed for those who need urgent Behavioral Health or Substance Use treatment and are not experiencing a medical crisis that would typically require an emergency room visit.    [place], [place] [address] Phone: [phone] [facility]: Phone#: [phone]   The [facility] SA Intensive Outpatient Program (SAIOP) means structured individual and group addiction activities and services that are provided at an outpatient program designed to assist adult and adolescent consumers to begin recovery and learn skills for recovery maintenance. The [place] program is offered at  least 3 hours a  day, 3 days a week.SAIOP services shall include a structured program consisting of, but not limited to, the following services: Individual counseling and support; Group counseling and support; Family counseling, training or support; Biochemical assays to identify recent drug use (e.g., urine drug screens); Strategies for relapse prevention to include community and social support systems in treatment; Life skills; Crisis contingency planning; Disease Management; and Treatment support activities that have been adapted or specifically designed for persons with physical disabilities, or persons with co-occurring disorders of mental illness and substance abuse/dependence or mental retardation/developmental disability and substance abuse/dependence. Phone: [place] [address] [place], [place] [address] Phone: [phone] Admissions team is available 24/7  Phone: [phone]. Fax: [phone]   [facility]     Admissions [address] [place], [place] [address] [phone]    [facility] [place]: [phone]  Behavioral Health Crisis Line: [phone]

## 2024-07-18 NOTE — Progress Notes (Signed)
   07/18/24 1536  [facility] Triage Screening (Walk-ins at [facility] only)  How Did You Hear About Us ? Self  What Is the Reason for Your Visit/Call Today? Pt Ethan Goodwin is a 13Y male presenting to [facility] as a vol walk-in. Pt states he was here for 3-4 days and was sent to [facility] so they could obtain blood. Pt states that [facility] was unable to obtain blood so he asked for a ride back to [facility] and they told him no. Pt is here to get a bed at [facility] or [facility]. Pt denies SI, HI, and AVH. Pt used .3g of meth about 8 hours ago.  How Long Has This Been Causing You Problems? <Week  Have You Recently Had Any Thoughts About Hurting Yourself? No  Are You Planning to Commit Suicide/Harm Yourself At This time? No  Have you Recently Had Thoughts About Hurting Someone Sherral? No  Are You Planning To Harm Someone At This Time? No  Physical Abuse Denies  Verbal Abuse Denies  Sexual Abuse Denies  Exploitation of patient/patient's resources Denies  Self-Neglect Denies  Are you currently experiencing any auditory, visual or other hallucinations? No  Have You Used Any Alcohol or Drugs in the Past 24 Hours? Yes  What Did You Use and How Much? Meth  Do you have any current medical co-morbidities that require immediate attention? No  What Do You Feel Would Help You the Most Today? Alcohol or Drug Use Treatment  Determination of Need Routine (7 days)  Options For Referral Chemical Dependency Intensive Outpatient Therapy (CDIOP);BH Urgent Care;Outpatient Therapy;Facility-Based Crisis;Medication Management

## 2024-07-18 NOTE — ED Notes (Signed)
 Patient left prior to receiving AVS

## 2024-07-18 NOTE — ED Provider Notes (Addendum)
 Behavioral Health Urgent Care Medical Screening Exam  Patient Name: Ethan Goodwin MRN: [id number] Date of Evaluation: 07/18/24 Chief Complaint:  I want to go to rehab Diagnosis:  Final diagnoses:  Methamphetamine abuse (HCC)    History of Present illness: Ethan Goodwin is a 33 y.o. male. Patient was in the [facility] yesterday but due to inability to draw blood was sent to the ER at [facility]. He eloped from the ER. Patient reports that they tried to get the blood at the ER and he wouldn't let them because it hurt too much. He left the ER and then went a shot methamphetamine into his foot because those are the only veins he can use.   He reports his mood as good. My mood is better than it was. He did not sleep last night. An associate gave him meth. I don't want to keep walking around outside. What I am around is what I do. To get through one day is hard. He says that the only reason he needs treatment is because he is on probation. My probation officer wants me to be here. Pt says he does not want to be violated because he does not want to be locked up. I want to explore the world and then I don't want to go to prison. It is my legal stuff.  He was referred to [facility] but that program wanted the labs such as the CD4 count. Pt is a difficult stick due to IV drug use. Patient has no SI/HI/AH/VH. He is agreeable to come back tomorrow for a referral to [facility]. He can also go in as a walkin to [facility].  [name] say pt with Clinical research associate and put a call and left VM with probation officer [name] [phone].    Flowsheet Row ED [facility] from 07/17/2024 in [facility] Emergency Department at [facility] ED from 07/15/2024 in [facility]  C-SSRS RISK CATEGORY No Risk No Risk No Risk    Psychiatric Specialty Exam  Presentation  General Appearance:Appropriate for Environment; Fairly  Groomed  Eye Contact:Good  Speech:Clear and Coherent; Normal Rate  Speech Volume:Normal  Handedness:Right   Mood and Affect  Mood: Depressed  Affect: Congruent   Thought Process  Thought Processes: Coherent  Descriptions of Associations:Intact  Orientation:Full (Time, Place and Person)  Thought Content:Logical  Diagnosis of Schizophrenia or Schizoaffective disorder in past: No  Duration of Psychotic Symptoms: Greater than six months  Hallucinations:None  Ideas of Reference:None  Suicidal Thoughts:No  Homicidal Thoughts:No   Sensorium  Memory: Recent Good; Remote Good  Judgment: Fair  Insight: Fair   Art therapist  Concentration: Good  Attention Span: Good  Recall: Good  Fund of Knowledge: Good  Language: Good   Psychomotor Activity  Psychomotor Activity: Normal   Assets  Assets: Communication Skills; Desire for Improvement; Resilience   Sleep  Sleep: Fair  Number of hours:  6   Physical Exam: Physical Exam HENT:     Head: Normocephalic and atraumatic.     Nose: Nose normal.  Eyes:     Conjunctiva/sclera: Conjunctivae normal.  Pulmonary:     Effort: Pulmonary effort is normal.  Musculoskeletal:        General: Normal range of motion.     Cervical back: Normal range of motion.  Neurological:     Mental Status: He is alert and oriented to person, place, and time.  Psychiatric:  Mood and Affect: Mood normal.        Behavior: Behavior normal.        Thought Content: Thought content normal.    Review of Systems  Constitutional:  Negative for chills and fever.  Eyes:  Negative for blurred vision and double vision.  Respiratory:  Negative for cough.   Cardiovascular:  Negative for chest pain.  Gastrointestinal:  Negative for heartburn, nausea and vomiting.  Genitourinary:  Negative for dysuria.  Neurological:  Negative for dizziness, tingling and headaches.  Psychiatric/Behavioral:  Positive for  substance abuse. Negative for depression, hallucinations and suicidal ideas. The patient has insomnia.    Blood pressure (!) 121/96, pulse 100, temperature 98.1 F (36.7 C), temperature source Oral, resp. rate 18, SpO2 100%. There is no height or weight on file to calculate BMI.  Musculoskeletal: Strength & Muscle Tone: within normal limits Gait & Station: normal Patient leans: N/A   [facility] MSE Discharge Disposition for Follow up and Recommendations: Based on my evaluation the patient does not appear to have an emergency medical condition and can be discharged with resources and follow up care in outpatient services for substance abuse residential treatment   Garvin JINNY [name], MD 07/18/2024, 4:54 PM

## 2024-07-19 ENCOUNTER — Ambulatory Visit (HOSPITAL_COMMUNITY)
Admission: EM | Admit: 2024-07-19 | Discharge: 2024-07-19 | Disposition: A | Discharge status: Home / Self Care | Attending: Psychiatry | Admitting: Psychiatry

## 2024-07-19 ENCOUNTER — Ambulatory Visit (HOSPITAL_COMMUNITY): Admission: EM | Admit: 2024-07-19 | Discharge: 2024-07-19 | Discharge status: Against Medical Advice

## 2024-07-19 DIAGNOSIS — F1510 Other stimulant abuse, uncomplicated: Secondary | ICD-10-CM | POA: Diagnosis not present

## 2024-07-19 DIAGNOSIS — Z5900 Homelessness unspecified: Secondary | ICD-10-CM

## 2024-07-19 NOTE — ED Provider Notes (Signed)
 Behavioral Health Urgent Care Medical Screening Exam  Patient Name: Ethan Goodwin MRN: [id number] Date of Evaluation: 07/19/24 Chief Complaint:  I don't want to go the inpatient/residential. I have my own plan. Diagnosis:  Final diagnoses:  Homelessness  Methamphetamine use disorder, mild (HCC)   Triage History of Present illness: Ethan Goodwin is a 33 y.o. male. presents to [facility] unaccompanied, voluntarily. PT states he came yesterday, but had to leave and was told to come back today. PT is here today to get checked in for detox. PT states it is part of his probation terms. Pt would like to detox from meth. Pt states he has been addicted for two years. PT denies SI, HI, AVH and alcohol use.   Subjective:  Patient presents here after being instructed to return to [facility] yesterday to go to [facility] to initiate methamphetamine detox. Patient relays that their principle concern is getting set up with a program that would do outpatient therapy and then a component that focuses heavily on setting him up with social support resources, such as transitional housing. He denies SI, HI, AVH. He describes his mood as calm and describes himself as highly motivated to reacclimate back into society with housing and a job. His last methamphetamine use was October 16th or 17th and was about 0.3 units of methamphetamines. He relays that he started using 2 years ago and does not describe himself as addicted to meth, but that it is something he does out of boredom or when he is in certain situations. His longest period of sobriety was 8 months in jail; he does not endorse every having experienced withdrawal symptoms. The options of [facility] as an initial step to meth detox and to further long term support programs was discussed and patient was initially amenable. He would ideally like to join [facility], but they require bloodwork that was unable to be gathered due to scarring on patient arms.    After further interview with  provider Dr. Cole, patient is initially amenable to plan to go to [facility] on Wednesday after admission to observation. Patient even acknowledged [facility] as a long-term placement, but shortly after changes his mind and would prefer to go with his plan of outpatient talk therapy and seeking out transitional housing independently.   The process of walk-in outpatient therapy was explained and subsequent medication management of his Prozac , Risperidone , and trazodone . Patient was amenable to this, asked to sit in the lobby, and left [facility] without receiving AVS.     Diagnosis:  Final diagnoses:  Homelessness  Methamphetamine use disorder, mild (HCC)    Total Time spent with patient: 30 minutes   Obtained from chart review:  Past Psychiatric History: Schizophrenia, GAD, methamphetamine use Past Medical History: HIV, secondary syphilis Family History: unknown Family Psychiatric  History: unknown Social History: Patient is currently homeless and is staying here and there Flowsheet Row ED from 07/19/2024 in [facility] ED from 07/18/2024 in [facility] ED from 07/17/2024 in [facility] Emergency Department at [facility]  C-SSRS RISK CATEGORY No Risk No Risk No Risk    Psychiatric Specialty Exam  Presentation  General Appearance:Casual; Disheveled  Eye Contact:Good  Speech:-- (Patient speech is at times hard to follow and vague. Some thoughts that are expressed are incomplete)  Speech Volume:Normal  Handedness:Right  Mood and Affect  Mood: Anxious  Affect: Full Range; Non-Congruent; Tearful labile  Thought Process  Thought Processes: Goal Directed  Descriptions of Associations:Loose  Orientation:Full (Time,  Place and Person)  Thought Content:Logical  Diagnosis of Schizophrenia or Schizoaffective disorder in past: No  Duration of Psychotic Symptoms: Greater than six months  Hallucinations:None  Ideas of  Reference:None  Suicidal Thoughts:No  Homicidal Thoughts:No   Sensorium  Memory: Immediate Fair; Recent Fair  Judgment: Fair  Insight: Fair   Art therapist  Concentration: Fair  Attention Span: Fair  Recall: Fair  Fund of Knowledge: Fair  Language: Fair   Psychomotor Activity  Psychomotor Activity: Normal   Assets  Assets: Communication Skills   Sleep  Sleep: Fair  Number of hours:  6   Physical Exam: Physical Exam ROS Blood pressure 114/86, pulse (!) 102, temperature 98 F (36.7 C), temperature source Oral, resp. rate 17, SpO2 100%. There is no height or weight on file to calculate BMI.  Musculoskeletal: Strength & Muscle Tone: within normal limits Gait & Station: normal Patient leans: N/A   [facility] MSE Discharge Disposition for Follow up and Recommendations: Patient offered a plan consisting of admission for OBServation until Wednesday where they would be transported to [facility] for intake @0900 . Patient is initially ok with this plan, but then after further discussion expresses that they would like outpatient therapy and to figure out transitional housing. Walk-in outpatient therapy was discussed and patient amenable to this and to start medication management in an outpatient setting for Trazodone , Risperidone , and Prozac . Physician clarified with patient whether they wanted to reconsider [facility] placement given it would optimize likelihood of success and a minimum would secure housing and supports.  Patient reiterated that they preferred outpatient options and requested returning to the waiting room. AVS in process and patient asked to sit in lobby to wait for this information. Patient then left lobby without AVS.   Open access clinic TOMORROW: Follow up with [facility] Residents Only WITHOUT private insurance.  Suicide Risk Assessment: Minimal: No identifiable suicidal ideation.  Patients  presenting with multiple, low intensity risk factors but future focused with definitive plans for short/intermediate-term goals. May be classified as minimal risk based on the severity of the depressive symptoms despite currently being off medication.   KANDI JAYSON HAHN, MD 07/19/2024, 3:29 PM

## 2024-07-19 NOTE — Progress Notes (Signed)
   07/19/24 1220  [facility] Triage Screening (Walk-ins at [facility] only)  What Is the Reason for Your Visit/Call Today? Ethan Goodwin 33y male presents to [facility] unaccompanied, voluntarily. PT states he came yesterday, but had to leave and was told to come back today. PT is here today to get checked in for detox. PT states it is part of his probation terms. Pt would like to detox from meth. Pt states he has been addicted for two years. PT denies SI, HI, AVH and alcohol use.  How Long Has This Been Causing You Problems? > than 6 months  Have You Recently Had Any Thoughts About Hurting Yourself? No  Are You Planning to Commit Suicide/Harm Yourself At This time? No  Have you Recently Had Thoughts About Hurting Someone Sherral? No  Are You Planning To Harm Someone At This Time? No  Physical Abuse Denies  Verbal Abuse Denies  Sexual Abuse Denies  Exploitation of patient/patient's resources Denies  Self-Neglect Yes, present (Comment)  Are you currently experiencing any auditory, visual or other hallucinations? No  Have You Used Any Alcohol or Drugs in the Past 24 Hours? No  Do you have any current medical co-morbidities that require immediate attention? Yes  Please describe current medical co-morbidities that require immediate attention: abscess/ looks like huge boil inner left leg  Clinician description of patient physical appearance/behavior: calm, cooperative, normal  What Do You Feel Would Help You the Most Today? Alcohol or Drug Use Treatment  Determination of Need Routine (7 days)  Options For Referral [facility] Urgent Care;Facility-Based Crisis;Intensive Outpatient Therapy

## 2024-07-19 NOTE — ED Provider Notes (Signed)
 Behavioral Health Progress Note  Date and Time: 07/19/2024 3:29 PM Name: Ethan Goodwin MRN:  [id number]  Subjective:  Patient presents here after being instructed to return to [facility] yesterday to go to [facility] to initiate methamphetamine detox. Patient relays that their principle concern is getting set up with a program that would do outpatient therapy and then a component that focuses heavily on setting him up with social support resources, such as transitional housing. He denies SI, HI, AVH. He describes his mood as calm and describes himself as highly motivated to reacclimate back into society with housing and a job. His last methamphetamine use was October 16th or 17th and was about 0.3 units of methamphetamines. He relays that he started using 2 years ago and does not describe himself as addicted to meth, but that it is something he does out of boredom or when he is in certain situations. His longest period of sobriety was 8 months in jail; he does not endorse every having experienced withdrawal symptoms. The options of [facility] as an initial step to meth detox and to further long term support programs was discussed and patient was initially amenable. He would ideally like to join [facility], but they require bloodwork that was unable to be gathered due to scarring on patient arms.   After further interview with provider Dr. Cole, patient is initially amenable to plan to go to [facility] on Wednesday after admission to observation, but shortly after changes his mind and would prefer to go with his plan of outpatient talk therapy and seeking out transitional housing independently. The process of walk-in outpatient therapy was explained and subsequent medication management of his Prozac , Risperidone , and trazodone . Patient was amenable to this, asked to sit in the lobby, and left [facility] without receiving AVS.    Diagnosis:  Final diagnoses:  Homelessness  Methamphetamine use disorder, mild (HCC)    Total  Time spent with patient: 30 minutes  Obtained from chart review:  Past Psychiatric History: Schizophrenia, GAD, methamphetamine use Past Medical History: HIV, secondary syphilis Family History: unknown Family Psychiatric  History: unknown Social History: Patient is currently homeless and is staying here and there   Current Medications:  No current facility-administered medications for this encounter.   Current Outpatient Medications  Medication Sig Dispense Refill   bictegravir-emtricitabine-tenofovir AF (BIKTARVY ) 50-200-25 MG TABS tablet Take 1 tablet by mouth daily. 30 tablet 2   FLUoxetine  (PROZAC ) 10 MG capsule Take 1 capsule (10 mg total) by mouth daily. 30 capsule 0   nicotine  (NICODERM CQ  - DOSED IN MG/24 HOURS) 21 mg/24hr patch Place 1 patch (21 mg total) onto the skin daily at 6 (six) AM. (Patient not taking: Reported on 07/14/2024) 28 patch 0   risperiDONE  (RISPERDAL ) 1 MG tablet Take 1 tablet (1 mg total) by mouth 2 (two) times daily. 60 tablet 0   traZODone  (DESYREL ) 50 MG tablet Take 1 tablet (50 mg total) by mouth at bedtime as needed for sleep. (Patient not taking: Reported on 07/14/2024) 30 tablet 0    Labs  Lab Results:  Admission on 07/14/2024, Discharged on 07/14/2024  Component Date Value Ref Range Status   POC Amphetamine UR 07/14/2024 Positive (A)  NONE DETECTED (Cut Off Level 1000 ng/mL) Final   POC Secobarbital (BAR) 07/14/2024 None Detected  NONE DETECTED (Cut Off Level 300 ng/mL) Final   POC Buprenorphine (BUP) 07/14/2024 None Detected  NONE DETECTED (Cut Off Level 10 ng/mL) Final   POC Oxazepam (BZO) 07/14/2024 None Detected  NONE DETECTED (  Cut Off Level 300 ng/mL) Final   POC Cocaine UR 07/14/2024 None Detected  NONE DETECTED (Cut Off Level 300 ng/mL) Final   POC Methamphetamine UR 07/14/2024 Positive (A)  NONE DETECTED (Cut Off Level 1000 ng/mL) Final   POC Morphine  07/14/2024 None Detected  NONE DETECTED (Cut Off Level 300 ng/mL) Final   POC Methadone  UR 07/14/2024 None Detected  NONE DETECTED (Cut Off Level 300 ng/mL) Final   POC Oxycodone  UR 07/14/2024 None Detected  NONE DETECTED (Cut Off Level 100 ng/mL) Final   POC Marijuana UR 07/14/2024 None Detected  NONE DETECTED (Cut Off Level 50 ng/mL) Final  Admission on 06/21/2024, Discharged on 06/21/2024  Component Date Value Ref Range Status   Specimen Description 06/21/2024 BLOOD LEFT ANTECUBITAL   Final   Special Requests 06/21/2024 Blood Culture results may not be optimal due to an inadequate volume of blood received in culture bottles BOTTLES DRAWN AEROBIC ONLY   Final   Culture 06/21/2024    Final                   Value:NO GROWTH 5 DAYS Performed at [facility], [address] [place], [place] [address]    Report Status 06/21/2024 06/26/2024 FINAL   Final  Admission on 06/01/2024, Discharged on 06/01/2024  Component Date Value Ref Range Status   SARS Coronavirus 2 by RT PCR 06/01/2024 NEGATIVE  NEGATIVE Final   Influenza A by PCR 06/01/2024 NEGATIVE  NEGATIVE Final   Influenza B by PCR 06/01/2024 NEGATIVE  NEGATIVE Final   Comment: (NOTE) The Xpert Xpress SARS-CoV-2/FLU/RSV plus assay is intended as an aid in the diagnosis of influenza from Nasopharyngeal swab specimens and should not be used as a sole basis for treatment. Nasal washings and aspirates are unacceptable for Xpert Xpress SARS-CoV-2/FLU/RSV testing.  Fact Sheet for Patients: BloggerCourse.com  Fact Sheet for Healthcare Providers: SeriousBroker.it  This test is not yet approved or cleared by the [place]  FDA and has been authorized for detection and/or diagnosis of SARS-CoV-2 by FDA under an Emergency Use Authorization (EUA). This EUA will remain in effect (meaning this test can be used) for the duration of the COVID-19 declaration under Section 564(b)(1) of the Act, 21 U.S.C. section 360bbb-3(b)(1), unless the authorization is terminated  or revoked.     Resp Syncytial Virus by PCR 06/01/2024 NEGATIVE  NEGATIVE Final   Comment: (NOTE) Fact Sheet for Patients: BloggerCourse.com  Fact Sheet for Healthcare Providers: SeriousBroker.it  This test is not yet approved or cleared by the [place]  FDA and has been authorized for detection and/or diagnosis of SARS-CoV-2 by FDA under an Emergency Use Authorization (EUA). This EUA will remain in effect (meaning this test can be used) for the duration of the COVID-19 declaration under Section 564(b)(1) of the Act, 21 U.S.C. section 360bbb-3(b)(1), unless the authorization is terminated or revoked.  Performed at [facility], [address] [place], [place] [address]   Office Visit on 04/20/2024  Component Date Value Ref Range Status   Neisseria Gonorrhea 04/20/2024 Negative   Final   Chlamydia 04/20/2024 Negative   Final   Comment 04/20/2024 Normal Reference Ranger Chlamydia - Negative   Final   Comment 04/20/2024 Normal Reference Range Neisseria Gonorrhea - Negative   Final   Neisseria Gonorrhea 04/20/2024 Negative   Final   Chlamydia 04/20/2024 Negative   Final   Comment 04/20/2024 Normal Reference Ranger Chlamydia - Negative   Final   Comment 04/20/2024 Normal Reference Range  Neisseria Gonorrhea - Negative   Final  Admission on 03/24/2024, Discharged on 03/27/2024  Component Date Value Ref Range Status   WBC 03/24/2024 6.3  4.0 - 10.5 K/uL Final   RBC 03/24/2024 4.37  4.22 - 5.81 MIL/uL Final   Hemoglobin 03/24/2024 13.8  13.0 - 17.0 g/dL Final   HCT 93/74/7974 41.9  39.0 - 52.0 % Final   MCV 03/24/2024 95.9  80.0 - 100.0 fL Final   MCH 03/24/2024 31.6  26.0 - 34.0 pg Final   MCHC 03/24/2024 32.9  30.0 - 36.0 g/dL Final   RDW 93/74/7974 13.1  11.5 - 15.5 % Final   Platelets 03/24/2024 201  150 - 400 K/uL Final   nRBC 03/24/2024 0.0  0.0 - 0.2 % Final   Neutrophils Relative % 03/24/2024 58  % Final   Neutro  Abs 03/24/2024 3.6  1.7 - 7.7 K/uL Final   Lymphocytes Relative 03/24/2024 33  % Final   Lymphs Abs 03/24/2024 2.1  0.7 - 4.0 K/uL Final   Monocytes Relative 03/24/2024 7  % Final   Monocytes Absolute 03/24/2024 0.4  0.1 - 1.0 K/uL Final   Eosinophils Relative 03/24/2024 1  % Final   Eosinophils Absolute 03/24/2024 0.1  0.0 - 0.5 K/uL Final   Basophils Relative 03/24/2024 0  % Final   Basophils Absolute 03/24/2024 0.0  0.0 - 0.1 K/uL Final   Immature Granulocytes 03/24/2024 1  % Final   Abs Immature Granulocytes 03/24/2024 0.06  0.00 - 0.07 K/uL Final   Performed at [facility], [address] [place], [place] [address]   Sodium 03/24/2024 139  135 - 145 mmol/L Final   Potassium 03/24/2024 3.8  3.5 - 5.1 mmol/L Final   Chloride 03/24/2024 107  98 - 111 mmol/L Final   CO2 03/24/2024 23  22 - 32 mmol/L Final   Glucose, Bld 03/24/2024 100 (H)  70 - 99 mg/dL Final   Glucose reference range applies only to samples taken after fasting for at least 8 hours.   BUN 03/24/2024 16  6 - 20 mg/dL Final   Creatinine, Ser 03/24/2024 0.98  0.61 - 1.24 mg/dL Final   Calcium 93/74/7974 9.3  8.9 - 10.3 mg/dL Final   Total Protein 93/74/7974 7.9  6.5 - 8.1 g/dL Final   Albumin 93/74/7974 3.7  3.5 - 5.0 g/dL Final   AST 93/74/7974 23  15 - 41 U/L Final   ALT 03/24/2024 19  0 - 44 U/L Final   Alkaline Phosphatase 03/24/2024 87  38 - 126 U/L Final   Total Bilirubin 03/24/2024 1.4 (H)  0.0 - 1.2 mg/dL Final   GFR, Estimated 03/24/2024 >60  >60 mL/min Final   Comment: (NOTE) Calculated using the CKD-EPI Creatinine Equation (2021)    Anion gap 03/24/2024 9  5 - 15 Final   Performed at [facility], [address], [place], [place] [address]   Hgb A1c MFr Bld 03/24/2024 5.3  4.8 - 5.6 % Final   Comment: (NOTE) Diagnosis of Diabetes The following HbA1c ranges recommended by the American Diabetes Association (ADA) may be used as an aid in the diagnosis of diabetes mellitus.  Hemoglobin              Suggested A1C NGSP%              Diagnosis  <5.7                   Non Diabetic  5.7-6.4                Pre-Diabetic  >6.4                   Diabetic  <7.0                   Glycemic control for                       adults with diabetes.     Mean Plasma Glucose 03/24/2024 105.41  mg/dL Final   Performed at [facility], [address] [place], [place] [address]   Alcohol, Ethyl (B) 03/24/2024 <15  <15 mg/dL Final   Comment: (NOTE) For medical purposes only. Performed at [facility], [address], [place], [place] [address]    Cholesterol 03/24/2024 127  0 - 200 mg/dL Final   Triglycerides 93/74/7974 49  <150 mg/dL Final   HDL 93/74/7974 54  >40 mg/dL Final   Total CHOL/HDL Ratio 03/24/2024 2.4  RATIO Final   VLDL 03/24/2024 10  0 - 40 mg/dL Final   LDL Cholesterol 03/24/2024 63  0 - 99 mg/dL Final   Comment:        Total Cholesterol/HDL:CHD Risk Coronary Heart Disease Risk Table                     Men   Women  1/2 Average Risk   3.4   3.3  Average Risk       5.0   4.4  2 X Average Risk   9.6   7.1  3 X Average Risk  23.4   11.0        Use the calculated Patient Ratio above and the CHD Risk Table to determine the patient's CHD Risk.        ATP III CLASSIFICATION (LDL):  <100     mg/dL   Optimal  899-870  mg/dL   Near or Above                    Optimal  130-159  mg/dL   Borderline  839-810  mg/dL   High  >809     mg/dL   Very High Performed at [facility], [address], [place], [place] [address]    TSH 03/24/2024 1.305  0.350 - 4.500 uIU/mL Final   Comment: Performed by a 3rd Generation assay with a functional sensitivity of <=0.01 uIU/mL. Performed at [facility], [address], [place], [place] [address]    Color, Urine 03/24/2024 AMBER (A)  YELLOW Final   BIOCHEMICALS MAY BE AFFECTED BY COLOR   APPearance 03/24/2024 CLEAR  CLEAR Final   Specific Gravity, Urine 03/24/2024 1.030  1.005 - 1.030 Final   pH 03/24/2024 5.0   5.0 - 8.0 Final   Glucose, UA 03/24/2024 NEGATIVE  NEGATIVE mg/dL Final   Hgb urine dipstick 03/24/2024 NEGATIVE  NEGATIVE Final   Bilirubin Urine 03/24/2024 NEGATIVE  NEGATIVE Final   Ketones, ur 03/24/2024 NEGATIVE  NEGATIVE mg/dL Final   Protein, ur 93/74/7974 NEGATIVE  NEGATIVE mg/dL Final   Nitrite 93/74/7974 NEGATIVE  NEGATIVE Final   Leukocytes,Ua 03/24/2024 NEGATIVE  NEGATIVE Final   Performed at [facility], [address], [place], [place] [address]   POC Amphetamine UR 03/24/2024 Positive (A)  NONE DETECTED (Cut Off Level 1000 ng/mL) Final   POC Secobarbital (BAR) 03/24/2024  None Detected  NONE DETECTED (Cut Off Level 300 ng/mL) Final   POC Buprenorphine (BUP) 03/24/2024 None Detected  NONE DETECTED (Cut Off Level 10 ng/mL) Final   POC Oxazepam (BZO) 03/24/2024 None Detected  NONE DETECTED (Cut Off Level 300 ng/mL) Final   POC Cocaine UR 03/24/2024 None Detected  NONE DETECTED (Cut Off Level 300 ng/mL) Final   POC Methamphetamine UR 03/24/2024 Positive (A)  NONE DETECTED (Cut Off Level 1000 ng/mL) Final   POC Morphine  03/24/2024 None Detected  NONE DETECTED (Cut Off Level 300 ng/mL) Final   POC Methadone UR 03/24/2024 None Detected  NONE DETECTED (Cut Off Level 300 ng/mL) Final   POC Oxycodone  UR 03/24/2024 None Detected  NONE DETECTED (Cut Off Level 100 ng/mL) Final   POC Marijuana UR 03/24/2024 None Detected  NONE DETECTED (Cut Off Level 50 ng/mL) Final    Blood Alcohol level:  Lab Results  Component Value Date   [facility] <15 03/24/2024    Metabolic Disorder Labs: Lab Results  Component Value Date   HGBA1C 5.3 03/24/2024   MPG 105.41 03/24/2024   No results found for: PROLACTIN Lab Results  Component Value Date   CHOL 127 03/24/2024   TRIG 49 03/24/2024   HDL 54 03/24/2024   CHOLHDL 2.4 03/24/2024   VLDL 10 03/24/2024   LDLCALC 63 03/24/2024   LDLCALC 68 08/03/2021    Therapeutic Lab Levels: No results found for: LITHIUM No results found for:  VALPROATE No results found for: CBMZ  Physical Findings   PHQ2-9    Flowsheet Row ED from 07/15/2024 in [facility] ED from 03/24/2024 in [facility] Office Visit from 06/09/2023 in [facility] Office Visit from 07/03/2021 in [facility] [place]. [facility] Office Visit from 03/29/2021 in [facility] - [facility] [place]. [facility]  PHQ-2 Total Score 6 0 0 0 2  PHQ-9 Total Score 10 0 -- -- 4   Flowsheet Row ED from 07/19/2024 in [facility] ED from 07/18/2024 in [facility] ED from 07/17/2024 in [facility] Emergency Department at [facility]  C-SSRS RISK CATEGORY No Risk No Risk No Risk     Musculoskeletal  Strength & Muscle Tone: within normal limits Gait & Station: normal Patient leans: N/A  Psychiatric Specialty Exam  Presentation  General Appearance:  Casual; Disheveled  Eye Contact: Good  Speech: -- (Patient speech is at times hard to follow and vague. Some thoughts that are expressed are incomplete)  Speech Volume: Normal  Handedness: Right   Mood and Affect  Mood: Anxious  Affect: Full Range; Non-Congruent; Tearful Patient intermittently laughs and giggles when discussing difficult topics such as medication non-adherence to Biktarvy . He is also intermittently tearful, consistent with coping behaviors.   Thought Process  Thought Processes: Goal Directed  Descriptions of Associations:Loose  Orientation:Full (Time, Place and Person)  Thought Content:Logical  Diagnosis of Schizophrenia or Schizoaffective disorder in past: No  Duration of Psychotic Symptoms: Greater than six months   Hallucinations:Hallucinations: None  Ideas of Reference:None  Suicidal Thoughts:Suicidal Thoughts: No  Homicidal  Thoughts:Homicidal Thoughts: No   Sensorium  Memory: Immediate Fair; Recent Fair  Judgment: Fair  Insight: Fair   Art therapist  Concentration: Fair  Attention Span: Fair  Recall: [employer] of  Knowledge: Fair  Language: Fair   Psychomotor Activity  Psychomotor Activity:Psychomotor Activity: Normal   Assets  Assets: Communication Skills   Sleep  Sleep:Sleep: Fair  No Safety Checks orders active in given range  No data recorded  Physical Exam  Physical Exam HENT:     Head: Normocephalic and atraumatic.  Pulmonary:     Effort: Pulmonary effort is normal.  Skin:    Comments: Track marks with areas of circumferential scarring appreciated on bilateral arms and hands  Neurological:     Mental Status: He is alert.  Psychiatric:        Mood and Affect: Mood normal.        Behavior: Behavior normal.        Thought Content: Thought content normal.    Review of Systems  Constitutional:  Negative for chills and fever.  Cardiovascular:  Negative for chest pain.  Neurological:  Negative for dizziness.  Psychiatric/Behavioral:  Negative for hallucinations and suicidal ideas.    Blood pressure 114/86, pulse (!) 102, temperature 98 F (36.7 C), temperature source Oral, resp. rate 17, SpO2 100%. There is no height or weight on file to calculate BMI.  Treatment Plan Summary: Plan Patient offered a plan consisting of admission for observation until Wednesday where they would be transported to [facility]. Patient is initially ok with this plan, but then after further discussion expresses that they would like outpatient therapy and to figure out transitional housing. Walk-in outpatient therapy was discussed and patient amenable to this and to start medication management in an outpatient setting for Trazodone , Risperidone , and Prozac . AVS was printed and patient asked to sit in lobby to wait for this information. Patient then left lobby without AVS.  Brad Prey, Medical Student 07/19/2024 3:29 PM     Prey Brad, Medical Student 07/19/24 [phone]

## 2024-07-19 NOTE — Care Management (Signed)
 [facility] Care Management   Writer spoke with [place] @ [facility].   Pt was previously accepted into their program and has appointment scheduled for 9 am on Wednesday 07/21/2024  Provider advised

## 2024-07-19 NOTE — Discharge Instructions (Signed)
 Walk-in hours for open access (medication management and therapy) are Monday - Friday 8 am to 11 am.  Appointments are limited, so please arrive BEFORE  7:00 am. Upon arrival, please complete the form on the clipboard located at the front desk. If there are no clipboards available, all appointments have been filled for that day.  [facility] [phone] 2nd Floor [place]  [address] [phone]

## 2024-07-19 NOTE — Discharge Summary (Signed)
 Ethan Goodwin to be discharged Home per MD order. An After Visit Summary was printed and given to the patient by provider. Patient escorted out and discharged home via private auto.  Dorla Jung  07/19/2024 3:14 PM

## 2024-07-21 ENCOUNTER — Ambulatory Visit (HOSPITAL_COMMUNITY): Admission: EM | Admit: 2024-07-21 | Discharge: 2024-07-21 | Disposition: A | Discharge status: Home / Self Care

## 2024-07-21 DIAGNOSIS — F1510 Other stimulant abuse, uncomplicated: Secondary | ICD-10-CM

## 2024-07-21 DIAGNOSIS — Z5900 Homelessness unspecified: Secondary | ICD-10-CM | POA: Diagnosis not present

## 2024-07-21 NOTE — Discharge Instructions (Signed)

## 2024-07-21 NOTE — Progress Notes (Signed)
   07/21/24 1541  [facility] Triage Screening (Walk-ins at [facility] only)  How Did You Hear About Us ? Self  What Is the Reason for Your Visit/Call Today? Ethan Goodwin is a 33 year old male presenting to [name] unaccompanied. Pt states that he is back today because he did not get a bed at [facility]. Pt states he is looking to get straight into rehab at this time. Pt states he is using meth daily and is looking for intense treatment. Pt denies alcohol use, Si, Hi and AVH.  Have You Recently Had Any Thoughts About Hurting Yourself? No  Are You Planning to Commit Suicide/Harm Yourself At This time? No  Have you Recently Had Thoughts About Hurting Someone Sherral? No  Are You Planning To Harm Someone At This Time? No  Physical Abuse Denies  Verbal Abuse Denies  Sexual Abuse Denies  Exploitation of patient/patient's resources Denies  Self-Neglect Denies  Are you currently experiencing any auditory, visual or other hallucinations? No  Have You Used Any Alcohol or Drugs in the Past 24 Hours? No  Do you have any current medical co-morbidities that require immediate attention? No  What Do You Feel Would Help You the Most Today? Housing Assistance  If access to [facility] was not available, would you have sought care in the Emergency Department? No  Determination of Need Routine (7 days)  Options For Referral Outpatient Therapy

## 2024-07-21 NOTE — ED Notes (Signed)
 Patient discharged by provider.

## 2024-07-21 NOTE — ED Provider Notes (Signed)
 Behavioral Health Urgent Care Medical Screening Exam  Patient Name: Ethan Goodwin MRN: [id number] Date of Evaluation: 07/21/24 Chief Complaint:  Seeking rehabilitation Diagnosis:  Final diagnoses:  Methamphetamine abuse (HCC)  Homelessness    History of Present illness: Ethan Goodwin is a 33 y.o. male with a history of methamphetamine dependence and HIV disease who presents to this [facility] seeking rehabilitation placement.  Patient's chart was reviewed and the information below found: Patient had been admitted to the [facility] on 10/18, but due to inability to obtain blood work from him, he was sent to the ER.  However, there was a plan in for patient to go to [facility] rehabilitation, as per chart review: Writer spoke with [place] @ [facility].   Pt was previously accepted into their program and has appointment scheduled for 9 am on Wednesday 07/21/2024.  Assessment on 07/21/2024: Patient states that he missed the appointment above, because he had gone out in the community to do many things, states that he is now ready to get clean, and go to rehab.  Patient denies suicidal ideations, denies homicidal ideations, denies AVH, denies paranoia and there is no delusional thinking.  Patient educated that he will have to call [facility] tomorrow on his own, and walk out plans to get there, since his intake was earlier today morning but he did not present there.  Patient given bus voucher from this facility, denies safety concerns.  Left our facility without any issues.  Denies intent or plan to harm himself or anyone else in the community.  Flowsheet Row ED from 07/21/2024 in [facility] ED from 07/19/2024 in [facility] ED from 07/18/2024 in [facility]  C-SSRS RISK CATEGORY No Risk No Risk No Risk    Psychiatric Specialty  Exam  Presentation  General Appearance:Disheveled  Eye Contact:Fair  Speech:Clear and Coherent  Speech Volume:Normal  Handedness:Right   Mood and Affect  Mood: Euthymic  Affect: Congruent   Thought Process  Thought Processes: Coherent  Descriptions of Associations:Intact  Orientation:Full (Time, Place and Person)  Thought Content:Logical  Diagnosis of Schizophrenia or Schizoaffective disorder in past: No  Duration of Psychotic Symptoms: Greater than six months  Hallucinations:None  Ideas of Reference:None  Suicidal Thoughts:No  Homicidal Thoughts:No   Sensorium  Memory: Immediate Fair  Judgment: Fair  Insight: Fair   Art therapist  Concentration: Fair  Attention Span: Fair  Recall: [employer] of Knowledge: Fair  Language: Fair   Psychomotor Activity  Psychomotor Activity: Normal   Assets  Assets: Resilience   Sleep  Sleep: Fair  Number of hours:  6   Physical Exam: Physical Exam Vitals and nursing note reviewed.  Neurological:     General: No focal deficit present.     Mental Status: He is oriented to person, place, and time.    Review of Systems  Psychiatric/Behavioral:  Positive for depression and substance abuse. Negative for hallucinations, memory loss and suicidal ideas. The patient is not nervous/anxious and does not have insomnia.   All other systems reviewed and are negative.  Blood pressure (!) 136/99, pulse 100, temperature 98.6 F (37 C), temperature source Oral, resp. rate 20, SpO2 99%. There is no height or weight on file to calculate BMI.  Musculoskeletal: Strength & Muscle Tone: within normal limits Gait & Station: normal Patient leans: N/A   [facility] MSE Discharge Disposition for Follow up and Recommendations: Based on my evaluation  the patient does not appear to have an emergency medical condition and can be discharged with resources and follow up care in outpatient services for Medication  Management and Individual Therapy  Educated on the need to follow-up with his medical and mental health providers in the community, verbalized understanding.  Donia Snell, NP 07/21/2024, 5:32 PM

## 2024-07-22 ENCOUNTER — Ambulatory Visit (HOSPITAL_COMMUNITY): Admission: EM | Admit: 2024-07-22 | Discharge: 2024-07-22 | Disposition: A | Discharge status: Home / Self Care

## 2024-07-22 DIAGNOSIS — Z765 Malingerer [conscious simulation]: Secondary | ICD-10-CM | POA: Diagnosis not present

## 2024-07-22 DIAGNOSIS — R4689 Other symptoms and signs involving appearance and behavior: Secondary | ICD-10-CM | POA: Diagnosis not present

## 2024-07-22 DIAGNOSIS — Z5900 Homelessness unspecified: Secondary | ICD-10-CM | POA: Diagnosis not present

## 2024-07-22 NOTE — ED Provider Notes (Signed)
 Behavioral Health Urgent Care Medical Screening Exam  Patient Name: Ethan Goodwin MRN: [id number] Date of Evaluation: 07/22/24 Chief Complaint:   Diagnosis:  Final diagnoses:  Malingering  Homelessness unspecified  Aggression aggravated    History of Present illness: Ethan Goodwin is a 33 y.o. male. With a history of homelessness, methamphetamine abuse, MDD.  Presented to [facility], voluntarily.  Per the patient he needs detox from methamphetamines.  Patient was very aggressive and very agitated.  When asked if he had followed up with the discharge instruction given to him the last time patient said no and he is not going to follow-up because they do not want to take him anymore time he goes there.  Patient went on to start cursing at writer you bitch, this nasty ass place is nothing,  fuck all of you stink ass bitch.  Writer discussed with patient that phone language would not be tolerated patient got up out of the room and stated let me out this motherfucker , patient can be over heard swearing and cussing and carrying on.  To face observation of patient, patient is alert and oriented x 4, speech is clear but very loud very aggressive and very agitated.  Patient denies SI, HI, but stating he needs rehab from meth.  I review of patient records show multiple ED visits and multiple admissions however when patient are discharged from these admissions he never follow-up with any outpatient resources given to him.  Patient is currently homeless and is malingering for secondary gain.  Patient can be very verbal abusive to staff when he does not get his way.  Writer discussed with patient that he needs [facility].  At this present moment patient does not seem to be a risk to himself or others.  Patient is advised to call 911 or return to nearest ED should he experience suicidal thoughts homicidal ideation or hallucination.  Patient is also encouraged  to [facility] with the [facility] shelter for [facility] assistance.   Recommend discharge for patient to follow-up with outpatient services and also the main shelter  Flowsheet Row ED from 07/22/2024 in [facility] ED from 07/21/2024 in [facility] ED from 07/19/2024 in [facility]  C-SSRS RISK CATEGORY No Risk No Risk No Risk    Psychiatric Specialty Exam  Presentation  General Appearance:Disheveled  Eye Contact:Fair  Speech:Clear and Coherent  Speech Volume:Increased  Handedness:Right   Mood and Affect  Mood: Euthymic  Affect: Congruent   Thought Process  Thought Processes: Coherent  Descriptions of Associations:Intact  Orientation:Full (Time, Place and Person)  Thought Content:WDL  Diagnosis of Schizophrenia or Schizoaffective disorder in past: No  Duration of Psychotic Symptoms: Greater than six months  Hallucinations:None  Ideas of Reference:None  Suicidal Thoughts:No  Homicidal Thoughts:No   Sensorium  Memory: Immediate Fair  Judgment: Poor  Insight: Poor   Executive Functions  Concentration: Fair  Attention Span: Fair  Recall: Fair  Fund of Knowledge: Fair  Language: Fair   Psychomotor Activity  Psychomotor Activity: Normal   Assets  Assets: Desire for Improvement; Resilience   Sleep  Sleep: Fair  Number of hours:  6   Physical Exam: Physical Exam HENT:     Head: Normocephalic.     Nose: Nose normal.  Eyes:     Pupils: Pupils are equal, round, and reactive to light.  Cardiovascular:     Rate and Rhythm: Normal rate.  Pulmonary:     Effort: Pulmonary effort is normal.  Musculoskeletal:        General: Normal range of motion.     Cervical back: Normal range of motion.  Neurological:     General: No focal deficit present.     Mental Status: He is alert.  Psychiatric:        Mood and Affect: Mood normal.        Behavior:  Behavior normal.        Thought Content: Thought content normal.        Judgment: Judgment normal.    Review of Systems  Constitutional: Negative.   HENT: Negative.    Eyes: Negative.   Respiratory: Negative.    Cardiovascular: Negative.   Gastrointestinal: Negative.   Genitourinary: Negative.   Musculoskeletal: Negative.   Skin: Negative.   Neurological: Negative.   Psychiatric/Behavioral:  Positive for substance abuse. The patient is nervous/anxious.    Blood pressure 121/87, pulse 86, temperature 97.7 F (36.5 C), temperature source Oral, resp. rate 16, SpO2 100%. There is no height or weight on file to calculate BMI.  Musculoskeletal: Strength & Muscle Tone: within normal limits Gait & Station: normal Patient leans: N/A   [facility] MSE Discharge Disposition for Follow up and Recommendations: Based on my evaluation the patient does not appear to have an emergency medical condition and can be discharged with resources and follow up care in outpatient services for Substance Abuse Intensive Outpatient Program   Gaither Pouch, NP 07/22/2024, 10:02 PM

## 2024-07-22 NOTE — Discharge Instructions (Addendum)
 F/u with the shelter

## 2024-07-22 NOTE — Progress Notes (Signed)
   07/22/24 2133  [facility] Triage Screening (Walk-ins at [facility] only)  How Did You Hear About Us ? Self  What Is the Reason for Your Visit/Call Today? Pt presents to [facility] as a voluntary walk-in, unaccompanied with complaint of homelessness and requesting substance abuse treatment. Pt reports he went to [facility] today, but was not able to be admitted because he arrived late. Pt has been seen at [facility] several times within the last week and was discharged yesterday. Pt did not allow triage tech to complete questions as he appeared to be responding to all questions with a yes as he wants to be admitted because he needs somewhere to stay.  How Long Has This Been Causing You Problems? > than 6 months  Have You Recently Had Any Thoughts About Hurting Yourself? Yes  How long ago did you have thoughts about hurting yourself? currently  Are You Planning to Commit Suicide/Harm Yourself At This time? Yes  Have you Recently Had Thoughts About Hurting Someone Sherral? Yes  How long ago did you have thoughts of harming others? currently  Are You Planning To Harm Someone At This Time? Yes  Physical Abuse Denies  Verbal Abuse Denies  Sexual Abuse Denies  Exploitation of patient/patient's resources Denies  Self-Neglect Yes, present (Comment)  Are you currently experiencing any auditory, visual or other hallucinations? Yes  Please explain the hallucinations you are currently experiencing: saying he is seeing people in the woods and outside  Have You Used Any Alcohol or Drugs in the Past 24 Hours? No  Do you have any current medical co-morbidities that require immediate attention? No  Clinician description of patient physical appearance/behavior: cooperative, casually dressed  What Do You Feel Would Help You the Most Today? Treatment for Depression or other mood problem;Alcohol or Drug Use Treatment  If access to [facility] Urgent Care was not available, would you have sought care in the Emergency Department? No  Determination of  Need Routine (7 days)  Options For Referral Other: Comment;Facility-Based Crisis;Chemical Dependency Intensive Outpatient Therapy (CDIOP)

## 2024-07-25 ENCOUNTER — Encounter (HOSPITAL_COMMUNITY): Payer: Self-pay

## 2024-07-25 ENCOUNTER — Emergency Department (HOSPITAL_COMMUNITY)
Admission: EM | Admit: 2024-07-25 | Discharge: 2024-07-25 | Discharge status: Against Medical Advice | Payer: Self-pay | Attending: Emergency Medicine | Admitting: Emergency Medicine

## 2024-07-25 ENCOUNTER — Other Ambulatory Visit: Payer: Self-pay

## 2024-07-25 DIAGNOSIS — R45851 Suicidal ideations: Secondary | ICD-10-CM | POA: Diagnosis not present

## 2024-07-25 DIAGNOSIS — M7989 Other specified soft tissue disorders: Secondary | ICD-10-CM | POA: Insufficient documentation

## 2024-07-25 DIAGNOSIS — J029 Acute pharyngitis, unspecified: Secondary | ICD-10-CM | POA: Insufficient documentation

## 2024-07-25 DIAGNOSIS — R0602 Shortness of breath: Secondary | ICD-10-CM | POA: Diagnosis not present

## 2024-07-25 DIAGNOSIS — Z5321 Procedure and treatment not carried out due to patient leaving prior to being seen by health care provider: Secondary | ICD-10-CM | POA: Diagnosis not present

## 2024-07-25 DIAGNOSIS — Z4800 Encounter for change or removal of nonsurgical wound dressing: Secondary | ICD-10-CM | POA: Insufficient documentation

## 2024-07-25 NOTE — ED Notes (Signed)
 Pt provided hospital scrubs and security has wand pt

## 2024-07-25 NOTE — ED Triage Notes (Signed)
 Pt bib ems from streets c/o sore throat, sob, and sore left leg.  Bilateral forearm swelling.   Hx IV drug user

## 2024-07-25 NOTE — ED Notes (Signed)
 Pt has various open wounds scattered across legs. Pt has bilateral lower forearm swelling. Pt state his throat no longer hurts but have aches all over. Pt state his HIV numbers may be off as well.

## 2024-07-25 NOTE — ED Notes (Signed)
Pt provided bag lunch and sprite.

## 2024-07-25 NOTE — ED Notes (Signed)
 Pt encourage to stay to be evaluated by EDP. Pt states he can no longer wait.

## 2024-07-25 NOTE — ED Notes (Signed)
 Pt decline getting labs. RN informed this will be beneficial for his care to determine if further tx is needed. Pt states he really don't want to be stuck anymore or care to find out if additional tx is needed.

## 2024-07-25 NOTE — ED Notes (Signed)
 Pt states he no longer feels SI and feels fine. Pt says he decline any further tx. Pt ambulated with a steady gait.

## 2024-07-27 NOTE — ED Triage Notes (Signed)
 Bilateral leg pain and swelling onset a few days ago that has progressively worsened until. Hx of IVDU in lower legs. Wounds noted to bilateral legs. Hx of HIV and has not took meds

## 2024-07-31 ENCOUNTER — Ambulatory Visit (HOSPITAL_COMMUNITY): Admission: EM | Admit: 2024-07-31 | Discharge: 2024-07-31 | Disposition: A | Discharge status: Home / Self Care

## 2024-07-31 DIAGNOSIS — F1510 Other stimulant abuse, uncomplicated: Secondary | ICD-10-CM | POA: Diagnosis not present

## 2024-07-31 NOTE — Discharge Instructions (Addendum)

## 2024-07-31 NOTE — Progress Notes (Addendum)
   07/31/24 1832  [facility] Triage Screening (Walk-ins at [facility] only)  What Is the Reason for Your Visit/Call Today? [place]en presents to [facility] voluntarily unaccompanied. Pt states he is suicidal and on drugs. Currently endorsing SI with a plan to jump of a bridge.Pt shares he used Meth yesterday evening, 1 gram. Pt most recently endorsed AVH on his walk here he heard voices make me feel trapped and depressed, seeing things in the trees and AI people. Pt denies HI, Abuse alcohol use  How Long Has This Been Causing You Problems? > than 6 months  Have You Recently Had Any Thoughts About Hurting Yourself? Yes  How long ago did you have thoughts about hurting yourself? currently plan to jump off bridge  Are You Planning to Commit Suicide/Harm Yourself At This time? Yes  Have you Recently Had Thoughts About Hurting Someone Sherral? No  Are You Planning To Harm Someone At This Time? No  Physical Abuse Denies  Verbal Abuse Denies  Sexual Abuse Denies  Exploitation of patient/patient's resources Denies  Please explain the hallucinations you are currently experiencing: voices make me feel trapped and depressed, seeing things in the trees and AI people.  Have You Used Any Alcohol or Drugs in the Past 24 Hours? Yes  What Did You Use and How Much? Meth yesterday everying 1 gram  Please describe current medical co-morbidities that require immediate attention: Positive for HIV  Clinician description of patient physical appearance/behavior: Pt is calm and disinterested  What Do You Feel Would Help You the Most Today? Treatment for Depression or other mood problem  Determination of Need Emergent (2 hours)  Options For Referral Inpatient Hospitalization;Intensive Outpatient Therapy;Medication Management;BH Urgent Care  Determination of Need filed? Yes

## 2024-07-31 NOTE — ED Provider Notes (Signed)
 Behavioral Health Urgent Care Medical Screening Exam  Patient Name: Ethan Goodwin MRN: [id number] Date of Evaluation: 07/31/24 Chief Complaint:   Diagnosis:  Final diagnoses:  Methamphetamine abuse (HCC)    History of Present illness: Ethan Goodwin is a 33 y.o. male with a psychiatric history of schizophrenia, anxiety, and methamphetamine use disorder and medical history of HIV and secondary syphilis. Patient presented voluntarily to the [facility] for detoxification from methamphetamine at the recommendation of his probation officer. He says he is currently on probation for common law robbery and breaking and entering.   Patient was seen face to face, his chart was reviewed and case discussed with Dr. Lawrnce. Patient initially reported suicidal ideation during triage but later denied current suicidal thoughts, explaining that he mentioned SI to facilitate his admission. He says he wants to be admitted to this facility for "3 days of detoxification", after which he would like to transfer to [facility] for long-term treatment. Patient acknowledges a history of IV drug abuse and reports using approximately 1 gram of methamphetamine every other day via IV, with his last use occurring yesterday 07/30/24. He denies using other substances. He says he has occasional visual/auditory hallucinations that he cannot describe but denies current/active hallucinations. He denies both suicidal ideation and homicidal ideation at the time of assessment. patient is alert and orientedx4. His speech is coherent, with normal rate and volume. He describes his mood as "okay," and his affect is congruent with his mood, although at times it appears somewhat agitated. Thought process is goal-directed, with no evidence of thought disorder. His insight is fair, and his judgment is poor. There are no current delusions or hallucinations.  He denies current suicidal or homicidal ideation and no there are no evidence that patient is  responding to stimuli.    Treatment plan was discussed with the patient, and it was explained that he could be admitted to the facility for detoxification. However, in order to be transferred to [facility] for long-term treatment, lab work, including a CD4 count, would be required. This was based on a previous assessment and chart review. The patient was adamant that he does not want any blood drawn, explaining that he is a hard stick and refuses to allow staff to poke him. Despite attempts by this provider to reassure the patient that only experienced staff would draw his blood, he became increasingly agitated and declined admission to the facility. The patient expressed that he no longer wished to be admitted and refused further assessment. He was informed that he could seek continued care at a later time should he change his mind.  Flowsheet Row ED from 07/31/2024 in [facility] ED from 07/25/2024 in [facility] Emergency Department at [facility] ED from 07/22/2024 in [facility]  C-SSRS RISK CATEGORY High Risk Moderate Risk No Risk    Psychiatric Specialty Exam  Presentation  General Appearance:Appropriate for Environment  Eye Contact:Good  Speech:Clear and Coherent  Speech Volume:Normal  Handedness:Right   Mood and Affect  Mood: Depressed  Affect: Congruent   Thought Process  Thought Processes: Coherent  Descriptions of Associations:Intact  Orientation:Full (Time, Place and Person)  Thought Content:WDL  Diagnosis of Schizophrenia or Schizoaffective disorder in past: No  Duration of Psychotic Symptoms: Greater than six months  Hallucinations:None  Ideas of Reference:None  Suicidal Thoughts:No  Homicidal Thoughts:No   Sensorium  Memory: Immediate Fair; Recent Fair; Remote Fair  Judgment: Poor  Insight: Fair   Art Therapist  Concentration:  Fair  Attention  Span: Fair  Recall: [employer] of Knowledge: Fair  Language: Fair   Psychomotor Activity  Psychomotor Activity: Normal   Assets  Assets: Desire for Improvement   Sleep  Sleep: Fair  Number of hours:  6   Physical Exam: Physical Exam Vitals and nursing note reviewed.  Constitutional:      General: He is not in acute distress.    Appearance: He is well-developed.  HENT:     Head: Normocephalic and atraumatic.  Eyes:     Conjunctiva/sclera: Conjunctivae normal.  Cardiovascular:     Rate and Rhythm: Normal rate.  Pulmonary:     Effort: Pulmonary effort is normal. No respiratory distress.     Breath sounds: Normal breath sounds.  Abdominal:     Palpations: Abdomen is soft.     Tenderness: There is no abdominal tenderness.  Musculoskeletal:        General: Normal range of motion.     Cervical back: Neck supple.  Skin:    Capillary Refill: Capillary refill takes less than 2 seconds.     Findings: Lesion present.  Neurological:     Mental Status: He is alert.  Psychiatric:        Mood and Affect: Mood normal.    ROS Blood pressure 106/81, pulse (!) 112, temperature 98.5 F (36.9 C), temperature source Oral, resp. rate 17, SpO2 98%. There is no height or weight on file to calculate BMI.  Musculoskeletal: Strength & Muscle Tone: {desc; muscle tone:32375} Gait & Station: {PE GAIT ED NATL:22525} Patient leans: {Patient Leans:21022755}   [facility] MSE Discharge Disposition for Follow up and Recommendations: {[facility] MSE Recommendations:24277}   Kathryne DELENA Show, NP 07/31/2024, 8:34 PM

## 2024-08-02 ENCOUNTER — Other Ambulatory Visit: Payer: Self-pay

## 2024-08-02 ENCOUNTER — Ambulatory Visit (HOSPITAL_COMMUNITY)
Admission: EM | Admit: 2024-08-02 | Discharge: 2024-08-02 | Disposition: A | Discharge status: Home / Self Care | Attending: Nurse Practitioner | Admitting: Nurse Practitioner

## 2024-08-02 ENCOUNTER — Emergency Department (HOSPITAL_COMMUNITY)
Admission: EM | Admit: 2024-08-02 | Discharge: 2024-08-02 | Discharge status: Against Medical Advice | Payer: Self-pay | Attending: Emergency Medicine | Admitting: Emergency Medicine

## 2024-08-02 ENCOUNTER — Encounter (HOSPITAL_COMMUNITY): Payer: Self-pay

## 2024-08-02 DIAGNOSIS — Z5900 Homelessness unspecified: Secondary | ICD-10-CM | POA: Insufficient documentation

## 2024-08-02 DIAGNOSIS — Z5321 Procedure and treatment not carried out due to patient leaving prior to being seen by health care provider: Secondary | ICD-10-CM | POA: Insufficient documentation

## 2024-08-02 DIAGNOSIS — M79604 Pain in right leg: Secondary | ICD-10-CM | POA: Insufficient documentation

## 2024-08-02 DIAGNOSIS — R45851 Suicidal ideations: Secondary | ICD-10-CM | POA: Insufficient documentation

## 2024-08-02 DIAGNOSIS — Z765 Malingerer [conscious simulation]: Secondary | ICD-10-CM | POA: Insufficient documentation

## 2024-08-02 DIAGNOSIS — F1994 Other psychoactive substance use, unspecified with psychoactive substance-induced mood disorder: Secondary | ICD-10-CM | POA: Insufficient documentation

## 2024-08-02 NOTE — Discharge Instructions (Signed)

## 2024-08-02 NOTE — BH Assessment (Signed)
 Aiken presents to [facility] voluntarily unaccompanied. Pt states he is suicidal and on drugs. Currently endorsing SI with a plan to jump of a bridge.Pt shares he used Meth yesterday evening, 1 gram. Pt most recently endorsed AVH on his walk here he heard voices make me feel trapped and depressed, seeing things in the trees and AI people. Pt denies HI, Abuse alcohol use. Pt says he injects about 1 gram of meth every other day. At 20:14 patient is saying that he is not currently suicidal. He said that earlier he was suicidal becaue he was depressed and hearing his own thoughts to harm himself. Pt is saying he has visual hallucinations but cannot explain it. He says his probation officer said that he needed to get detox for 3 days then she Psychologist, Educational) can try to get him into [facility]. Pt denies paranoia. He does not have any access to guns. Pt does not want to be stuck to get labs completed.

## 2024-08-02 NOTE — ED Triage Notes (Addendum)
 Pt endorsing SI x 1 week with a plan to jump into traffic; also c/o r leg pain; denies new injury; pt smiling and laughing on arrival to ED

## 2024-08-02 NOTE — BH Assessment (Incomplete)
 Aiken presents to [facility] voluntarily unaccompanied. Pt states he is suicidal and on drugs. Currently endorsing SI with a plan to jump of a bridge.Pt shares he used Meth yesterday evening, 1 gram. Pt most recently endorsed AVH on his walk here he heard voices make me feel trapped and depressed, seeing things in the trees and AI people. Pt denies HI, Abuse alcohol use. Pt says he injects about 1 gram of meth every other day. At 20:14 patient is saying that he is not currently suicidal. He said that earlier he was suicidal becaue he was depressed and hearing his own thoughts to harm himself. Pt is saying he has visual hallucinations but cannot explain it. He says his probation officer said that he needed to get detox for 3 days then she Psychologist, Educational) can try to get him into [facility]. Pt denies paranoia. He does not have any access to guns. Pt does not want to be stuck to get labs completed.
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
## 2024-08-02 NOTE — ED Provider Triage Note (Signed)
 Emergency Medicine Provider Triage Evaluation Note  Ethan Goodwin , a 33 y.o. male  was evaluated in triage.  Pt complains of suicidal ideation. Plans to walk into traffic. Reports visual hallucinations states Im seeing people and trees. Not taking any medications currently. Went to [facility] and was recommended for inpatient treatment 2 days ago, however reports they required blood work for this and they dont know I dont have any blood. Denies somatic compalints  Review of Systems  Positive:  Negative:   Physical Exam  BP (!) 130/90   Pulse 89   Temp 98.1 F (36.7 C)   Resp 16   Ht 5' 7 (1.702 m)   Wt 64.9 kg   SpO2 99%   BMI 22.40 kg/m  Gen:   Awake, no distress   Resp:  Normal effort  MSK:   Moves extremities without difficulty  Other:    Medical Decision Making  Medically screening exam initiated at 4:45 PM.  Appropriate orders placed.  Ethan Goodwin was informed that the remainder of the evaluation will be completed by another provider, this initial triage assessment does not replace that evaluation, and the importance of remaining in the ED until their evaluation is complete.     Nora Lauraine LABOR, PA-C 08/02/24 [phone]

## 2024-08-02 NOTE — ED Provider Notes (Signed)
 Behavioral Health Urgent Care Medical Screening Exam  Patient Name: Ethan Goodwin MRN: [id number] Date of Evaluation: 08/03/24 Chief Complaint:   I was just bored Diagnosis:  Final diagnoses:  Malingering  Homelessness    History of Present illness: Zacchaeus Halm is a 33 y.o. male. Uziel Covault is a 33 year old male with psychiatric history of IV drug use, substance-induced mood disorder, meth amphetamine use disorder and homelessness, who presented voluntarily as a walk-in to [facility].  Patient was seen face-to-face by this provider and chart review. Patient was heard singing loudly in the evaluation room. Security on standby.  As soon as he sees this provider he stands up and yells I'm ready to go, I don't wanna stay, I'm ready to go. Patient was assessed for SI/HI/AVH or paranoia and he denies, stating no, I don't have any of those, I just wanna go.  Per triage report, patient reported SI with a plan to jump out of traffic, ongoing hallucinations and requesting detox from methamphetamine.  He was asked the purpose of tonight's [facility] visit, and he states 'I was bored, I was just bored, that's it, I'm ready to go, don't ask me anything.   Patient is now standing in the hallway, demanding to be let out and refusing to cooperate. He is able to contract for safety.  On evaluation, patient is alert, oriented x 3, and uncooperative. Speech is clear and coherent. Pt appears appropriately dressed. Eye contact is fair. Mood is euphoric, affect is congruent with mood. Thought process is coherent and thought content is WDL. Pt denies SI/HI/AVH. There is no objective indication that the patient is responding to internal stimuli. No delusions elicited during this assessment.    Flowsheet Row ED from 08/02/2024 in [facility] Most recent reading at 08/02/2024 10:42 PM ED from 08/02/2024 in [facility] Emergency Department at [facility] Most recent  reading at 08/02/2024  4:34 PM ED from 07/31/2024 in [facility] Most recent reading at 07/31/2024  6:37 PM  C-SSRS RISK CATEGORY High Risk High Risk High Risk    Psychiatric Specialty Exam  Presentation  General Appearance:Appropriate for Environment  Eye Contact:Fair  Speech:Clear and Coherent  Speech Volume:Increased  Handedness:Right   Mood and Affect  Mood: Euphoric  Affect: Congruent   Thought Process  Thought Processes: Coherent  Descriptions of Associations:Intact  Orientation:Full (Time, Place and Person)  Thought Content:WDL  Diagnosis of Schizophrenia or Schizoaffective disorder in past: No  Duration of Psychotic Symptoms: Greater than six months  Hallucinations:None  Ideas of Reference:None  Suicidal Thoughts:No  Homicidal Thoughts:No   Sensorium  Memory: Immediate Fair  Judgment: Intact  Insight: Present   Executive Functions  Concentration: Good  Attention Span: Good  Recall: Good  Fund of Knowledge: Good  Language: Good   Psychomotor Activity  Psychomotor Activity: Mannerisms   Assets  Assets: Communication Skills; Desire for Improvement   Sleep  Sleep: Fair  Number of hours:  6   Physical Exam: Physical Exam Constitutional:      General: He is not in acute distress.    Appearance: He is not diaphoretic.  HENT:     Nose: No congestion.  Cardiovascular:     Rate and Rhythm: Normal rate.  Pulmonary:     Effort: No respiratory distress.  Chest:     Chest wall: No tenderness.  Neurological:     Mental Status: He is alert and oriented to person, place, and time.  Psychiatric:  Attention and Perception: Attention and perception normal.        Mood and Affect: Mood is elated.        Speech: Speech normal.        Behavior: Behavior is uncooperative.        Thought Content: Thought content normal.    Review of Systems  Constitutional:  Negative for chills,  diaphoresis and fever.  HENT:  Negative for congestion.   Eyes:  Negative for discharge.  Respiratory:  Negative for cough, shortness of breath and wheezing.   Cardiovascular:  Negative for chest pain and palpitations.  Gastrointestinal:  Negative for diarrhea, nausea and vomiting.  Neurological:  Negative for dizziness, seizures, weakness and headaches.  Psychiatric/Behavioral: Negative.     Blood pressure (!) 128/93, pulse 83, temperature 98.5 F (36.9 C), temperature source Oral, resp. rate 18, SpO2 100%. There is no height or weight on file to calculate BMI.  Musculoskeletal: Strength & Muscle Tone: within normal limits Gait & Station: normal Patient leans: N/A   [facility] MSE Discharge Disposition for Follow up and Recommendations: Based on my evaluation the patient does not appear to have an emergency medical condition and can be discharged with resources and follow up care in outpatient services for Substance Abuse Intensive Outpatient Program  Patient denies SI/HI/AVH or paranoia.  Patient does not meet inpatient psychiatric admission criteria or IVC criteria at this time.  There is no evidence of imminent risk of harm to self or others.  Patient is able to contract for safety.  Patient has a history of multiple ED/urgent care visits and well-known to the psychiatric service line. Per chart review, patient was at [facility] and [facility] after triage.  Discharge recommendations:  Patient is to take medications as prescribed. Please follow up with your primary care provider for all medical related needs.   Therapy: We recommend that patient participate in individual therapy to address mental health concerns.  Medications: The patient or guardian is to contact a medical professional and/or outpatient provider to address any new side effects that develop. The patient or guardian should update outpatient providers of any new medications and/or medication changes.   Atypical antipsychotics: If  you are prescribed an atypical antipsychotic, it is recommended that your height, weight, BMI, blood pressure, fasting lipid panel, and fasting blood sugar be monitored by your outpatient providers.  Safety:  The patient should abstain from use of illicit substances/drugs and abuse of any medications. If symptoms worsen or do not continue to improve or if the patient becomes actively suicidal or homicidal then it is recommended that the patient return to the closest hospital emergency department, the [facility], or call 911 for further evaluation and treatment. National Suicide Prevention Lifeline 1-800-SUICIDE or 947 221 5358.  About 988 988 offers 24/7 access to trained crisis counselors who can help people experiencing mental health-related distress. People can call or text 988 or chat 988lifeline.org for themselves or if they are worried about a loved one who may need crisis support.  Crisis Mobile: [facility]:                     (806) 161-6474 (for crisis response 24 hours a day) [facility] Hotline:                                            (513)336-0896  Patient discharged in stable condition.  Thurman LULLA Ivans, NP 08/03/2024, 12:16 AM

## 2024-08-02 NOTE — ED Notes (Signed)
 Pt decided he was leaving & did not want to stay when phlebotomy stepped into his room during triage to draw his labs.

## 2024-08-03 ENCOUNTER — Emergency Department (HOSPITAL_COMMUNITY)
Admission: EM | Admit: 2024-08-03 | Discharge: 2024-08-03 | Disposition: A | Discharge status: Home / Self Care | Payer: Self-pay | Attending: Emergency Medicine | Admitting: Emergency Medicine

## 2024-08-03 ENCOUNTER — Encounter (HOSPITAL_COMMUNITY): Payer: Self-pay

## 2024-08-03 DIAGNOSIS — L02415 Cutaneous abscess of right lower limb: Secondary | ICD-10-CM | POA: Insufficient documentation

## 2024-08-03 DIAGNOSIS — Z21 Asymptomatic human immunodeficiency virus [HIV] infection status: Secondary | ICD-10-CM | POA: Insufficient documentation

## 2024-08-03 DIAGNOSIS — L02419 Cutaneous abscess of limb, unspecified: Secondary | ICD-10-CM

## 2024-08-03 DIAGNOSIS — R110 Nausea: Secondary | ICD-10-CM | POA: Insufficient documentation

## 2024-08-03 DIAGNOSIS — R109 Unspecified abdominal pain: Secondary | ICD-10-CM | POA: Insufficient documentation

## 2024-08-03 DIAGNOSIS — F1590 Other stimulant use, unspecified, uncomplicated: Secondary | ICD-10-CM | POA: Insufficient documentation

## 2024-08-03 LAB — URINE DRUG SCREEN
Amphetamines: POSITIVE — AB
Barbiturates: NEGATIVE
Benzodiazepines: NEGATIVE
Cocaine: NEGATIVE
Fentanyl: NEGATIVE
Methadone Scn, Ur: NEGATIVE
Opiates: NEGATIVE
Tetrahydrocannabinol: NEGATIVE

## 2024-08-03 LAB — COMPREHENSIVE METABOLIC PANEL WITH GFR
ALT: 26 U/L (ref 0–44)
AST: 26 U/L (ref 15–41)
Albumin: 4 g/dL (ref 3.5–5.0)
Alkaline Phosphatase: 93 U/L (ref 38–126)
Anion gap: 12 (ref 5–15)
BUN: 16 mg/dL (ref 6–20)
CO2: 23 mmol/L (ref 22–32)
Calcium: 9.5 mg/dL (ref 8.9–10.3)
Chloride: 106 mmol/L (ref 98–111)
Creatinine, Ser: 0.89 mg/dL (ref 0.61–1.24)
GFR, Estimated: >60 mL/min (ref >60)
Glucose, Bld: 142 mg/dL — ABNORMAL HIGH (ref 70–99)
Potassium: 3.8 mmol/L (ref 3.5–5.1)
Sodium: 141 mmol/L (ref 135–145)
Total Bilirubin: 0.6 mg/dL (ref 0.0–1.2)
Total Protein: 7.5 g/dL (ref 6.5–8.1)

## 2024-08-03 LAB — CBC
HCT: 40.1 % (ref 39.0–52.0)
Hemoglobin: 12.7 g/dL — ABNORMAL LOW (ref 13.0–17.0)
MCH: 30.4 pg (ref 26.0–34.0)
MCHC: 31.7 g/dL (ref 30.0–36.0)
MCV: 95.9 fL (ref 80.0–100.0)
Platelets: 217 K/uL (ref 150–400)
RBC: 4.18 MIL/uL — ABNORMAL LOW (ref 4.22–5.81)
RDW: 14.3 % (ref 11.5–15.5)
WBC: 5.4 K/uL (ref 4.0–10.5)
nRBC: 0 % (ref 0.0–0.2)

## 2024-08-03 LAB — ETHANOL: Alcohol, Ethyl (B): <15 mg/dL (ref <15)

## 2024-08-03 MED ORDER — ONDANSETRON 4 MG PO TBDP
4.0000 mg | ORAL_TABLET | Freq: Once | ORAL | Status: AC
  Administered 2024-08-03: 4 mg via ORAL
  Filled 2024-08-03: qty 1

## 2024-08-03 MED ORDER — LIDOCAINE-EPINEPHRINE (PF) 2 %-1:200000 IJ SOLN
10.0000 mL | Freq: Once | INTRAMUSCULAR | Status: AC
  Administered 2024-08-03: 10 mL
  Filled 2024-08-03: qty 20

## 2024-08-03 MED ORDER — DOXYCYCLINE HYCLATE 100 MG PO CAPS: 100.0000 mg | ORAL_CAPSULE | Freq: Two times a day (BID) | ORAL | 0 refills | Status: AC

## 2024-08-03 NOTE — ED Notes (Signed)
Save red tube in main lab 

## 2024-08-03 NOTE — Discharge Instructions (Addendum)
 Warm compresses twice daily. Keep area clean.  I recommended that we incise and drain the abscess today however you declined this.  Return when you are open to being reevaluated.  Please restart your HIV medications, please follow-up with [place] wellness clinic and give you their information.  I have also given you information for shelters in the area including including [facility] in [place].  Take doxycycline  for entire course even if your symptoms start improving.   Emergency Assistance [employer] [facility] [address] [place], [place] Phone: [phone] Website: completewords.pl  [facility] [address], [place], [place] Phone: [phone] Website: [email or web address].[email or web address]  [facility] [address] [place], [place] [address] Phone: [phone] Website: http://greensborourbanministry.[email or web address]  [facility] [employer] 2-1-1 any time 24 hours a day, 365 days a year to link to vital services in your community. This service is free and multilingual. Phone: 211 Website: http://www.nc211.[facility] Department of Social Services [address] [place], [place] [address] Phone: [phone] Website: builderweekly.hu   [facility] [address] [place], [place] [address] [phone]

## 2024-08-03 NOTE — ED Triage Notes (Signed)
 Pt was picked up at the refuel requesting help and assistance with his behavioral issues. He mentions he is suicidal. He is otherwise cooperative in triage, denies drug use.

## 2024-08-03 NOTE — ED Triage Notes (Signed)
 Was just discharged less than 5 minutes ago, never exited the facility

## 2024-08-03 NOTE — ED Provider Notes (Signed)
 [place] EMERGENCY DEPARTMENT AT [facility] Provider Note   CSN: [id number] Arrival date & time: 08/03/24  9864     Patient presents with: Suicidal   Ethan Goodwin is a 33 y.o. male.   33 year old male presents emergency room with request for detox from meth.  He states that his probation officer told him that if he could get into a program to come to the emergency room.  On presentation, patient had told the staff that he was suicidal.  He states that he is not suicidal he just needs detox.  He last used meth 2 days ago.  He denies other drug use.  Denies alcohol use.  Denies homicidal ideation.  No other complaints or concerns tonight.       Prior to Admission medications   Medication Sig Start Date End Date Taking? Authorizing Provider  bictegravir-emtricitabine-tenofovir AF (BIKTARVY ) 50-200-25 MG TABS tablet Take 1 tablet by mouth daily. 04/20/24   Waddell Alan PARAS, RPH-CPP  FLUoxetine  (PROZAC ) 10 MG capsule Take 1 capsule (10 mg total) by mouth daily. 03/27/24   Lenard Calin, MD  nicotine  (NICODERM CQ  - DOSED IN MG/24 HOURS) 21 mg/24hr patch Place 1 patch (21 mg total) onto the skin daily at 6 (six) AM. Patient not taking: Reported on 07/14/2024 03/27/24   Lenard Calin, MD  risperiDONE  (RISPERDAL ) 1 MG tablet Take 1 tablet (1 mg total) by mouth 2 (two) times daily. 03/26/24   Lenard Calin, MD  traZODone  (DESYREL ) 50 MG tablet Take 1 tablet (50 mg total) by mouth at bedtime as needed for sleep. Patient not taking: Reported on 07/14/2024 03/26/24   Lenard Calin, MD    Allergies: Patient has no known allergies.    Review of Systems Negative except as per HPI Updated Vital Signs BP 125/87 (BP Location: Left Arm)   Pulse 96   Temp 98.2 F (36.8 C) (Oral)   Resp 17   SpO2 100%   Physical Exam Vitals and nursing note reviewed.  Constitutional:      General: He is not in acute distress.    Appearance: He is well-developed. He is not diaphoretic.   HENT:     Head: Normocephalic and atraumatic.  Cardiovascular:     Rate and Rhythm: Normal rate and regular rhythm.     Heart sounds: Normal heart sounds.  Pulmonary:     Effort: Pulmonary effort is normal.     Breath sounds: Normal breath sounds.  Skin:    General: Skin is warm and dry.     Findings: No erythema or rash.  Neurological:     Mental Status: He is alert and oriented to person, place, and time.  Psychiatric:        Behavior: Behavior normal.     (all labs ordered are listed, but only abnormal results are displayed) Labs Reviewed  COMPREHENSIVE METABOLIC PANEL WITH GFR - Abnormal; Notable for the following components:      Result Value   Glucose, Bld 142 (*)    All other components within normal limits  CBC - Abnormal; Notable for the following components:   RBC 4.18 (*)    Hemoglobin 12.7 (*)    All other components within normal limits  URINE DRUG SCREEN - Abnormal; Notable for the following components:   Amphetamines POSITIVE (*)    All other components within normal limits  ETHANOL    EKG: None  Radiology: No results found.   Procedures   Medications Ordered in the ED -  No data to display                                  Medical Decision Making Amount and/or Complexity of Data Reviewed Labs: ordered.   This patient presents to the ED for concern of detox from meth, this involves an extensive number of treatment options, and is a complaint that carries with it a high risk of complications and morbidity.  The differential diagnosis includes substance abuse   Co morbidities / Chronic conditions that complicate the patient evaluation  HIV, syphilis, substance abuse   Additional history obtained:  Additional history obtained from EMR External records from outside source obtained and reviewed including encounter at behavioral health urgent care earlier tonight where patient presented, was reportedly suicidal and then denied this and asked to  leave.  He contracted for safety and was discharged.   Lab Tests:  I Ordered, and personally interpreted labs.  The pertinent results include: Positive for amphetamines.  Alcohol negative.  CBC and CMP without significant findings.    Problem List / ED Course / Critical interventions / Medication management  33 year old male presents with request for detox for methamphetamines.  Patient was told by his parole officer to go to the ER for this.  He did go to behavioral urgent care today however he left.  He denies suicidal homicidal ideation.  He is medically cleared to go to treatment program of his choice.  He is provided with a list of outpatient and residential treatment centers. I have reviewed the patients home medicines and have made adjustments as needed   Social Determinants of Health:  Sees infectious disease   Test / Admission - Considered:  Stable for discharge      Final diagnoses:  Amphetamine use    ED Discharge Orders     None          Beverley Leita LABOR, PA-C 08/03/24 0400    Griselda Norris, MD 08/03/24 0700

## 2024-08-03 NOTE — ED Provider Notes (Signed)
 [place] EMERGENCY DEPARTMENT AT [facility] Provider Note   CSN: [id number] Arrival date & time: 08/03/24  9487     Patient presents with: Nausea   Ethan Goodwin is a 33 y.o. male.   HPI  Patient is a 33 year old male present emergency room today with complaints of nausea abdominal pain or fevers.  No urinary procedures concerning injury.  Normal bowel movements.      Prior to Admission medications   Medication Sig Start Date End Date Taking? Authorizing Provider  doxycycline  (VIBRAMYCIN ) 100 MG capsule Take 1 capsule (100 mg total) by mouth 2 (two) times daily for 7 days. 08/03/24 08/10/24 Yes Damarie Schoolfield S, PA  bictegravir-emtricitabine-tenofovir AF (BIKTARVY ) 50-200-25 MG TABS tablet Take 1 tablet by mouth daily. 04/20/24   Waddell Alan PARAS, RPH-CPP  FLUoxetine  (PROZAC ) 10 MG capsule Take 1 capsule (10 mg total) by mouth daily. 03/27/24   Lenard Calin, MD  nicotine  (NICODERM CQ  - DOSED IN MG/24 HOURS) 21 mg/24hr patch Place 1 patch (21 mg total) onto the skin daily at 6 (six) AM. Patient not taking: Reported on 07/14/2024 03/27/24   Lenard Calin, MD  risperiDONE  (RISPERDAL ) 1 MG tablet Take 1 tablet (1 mg total) by mouth 2 (two) times daily. 03/26/24   Lenard Calin, MD  traZODone  (DESYREL ) 50 MG tablet Take 1 tablet (50 mg total) by mouth at bedtime as needed for sleep. Patient not taking: Reported on 07/14/2024 03/26/24   Lenard Calin, MD    Allergies: Patient has no known allergies.    Review of Systems  Updated Vital Signs BP 126/87 (BP Location: Left Arm)   Pulse 95   Temp 98.1 F (36.7 C) (Oral)   Resp 18   Ht 5' 7 (1.702 m)   Wt 64.9 kg   SpO2 99%   BMI 22.41 kg/m   Physical Exam Vitals and nursing note reviewed.  Constitutional:      General: He is not in acute distress. HENT:     Head: Normocephalic and atraumatic.     Nose: Nose normal.     Mouth/Throat:     Mouth: Mucous membranes are moist.  Eyes:     General: No  scleral icterus. Cardiovascular:     Rate and Rhythm: Normal rate and regular rhythm.     Pulses: Normal pulses.     Heart sounds: Normal heart sounds.  Pulmonary:     Effort: Pulmonary effort is normal. No respiratory distress.     Breath sounds: No wheezing.  Abdominal:     Palpations: Abdomen is soft.     Tenderness: There is no abdominal tenderness. There is no guarding or rebound.  Musculoskeletal:     Cervical back: Normal range of motion.     Right lower leg: No edema.     Left lower leg: No edema.  Skin:    General: Skin is warm and dry.     Capillary Refill: Capillary refill takes less than 2 seconds.     Comments: Right lower extremity with abscess on the medial aspect of the right ankle.  It is fluctuant.  Approximately 3 cm in diameter  Neurological:     Mental Status: He is alert. Mental status is at baseline.  Psychiatric:        Mood and Affect: Mood normal.        Behavior: Behavior normal.     (all labs ordered are listed, but only abnormal results are displayed) Labs Reviewed - No data to display  EKG: None  Radiology: No results found.   Procedures   Medications Ordered in the ED  ondansetron  (ZOFRAN -ODT) disintegrating tablet 4 mg (4 mg Oral Given 08/03/24 0828)  lidocaine -EPINEPHrine (XYLOCAINE  W/EPI) 2 %-1:200000 (PF) injection 10 mL (10 mLs Infiltration Given by Other 08/03/24 9171)                                    Medical Decision Making Risk Prescription drug management.   Patient is a 33 year old male present emergency room today with complaints of nausea abdominal pain or fevers.  No urinary procedures concerning injury.  Normal bowel movements.  Does have abscess on right ankle recommend incision and drainage patient agreed to this and then just moments before inserting 27-gauge needle to infiltrate lidocaine  with epi patient changes mind.  Aborted procedure.  He does have HIV plan to prescribe doxycycline .  Recommend return to  emergency room for recheck.  Return precautions discussed.  Follow-up with primary care  Final diagnoses:  Nausea  Abscess of ankle    ED Discharge Orders          Ordered    doxycycline  (VIBRAMYCIN ) 100 MG capsule  2 times daily        08/03/24 [address] [place], [place] 08/03/24 1617    Randol Simmonds, MD 08/04/24 [phone]

## 2024-08-03 NOTE — Discharge Instructions (Addendum)
 Follow-up with residential and outpatient counseling resources provided in your paperwork. Go to [facility] behavioral urgent care as needed.

## 2024-08-03 NOTE — ED Notes (Signed)
 Patient set up for I&D of ankle but refused once PA came in to do it.

## 2024-08-12 ENCOUNTER — Ambulatory Visit (HOSPITAL_COMMUNITY): Admission: EM | Admit: 2024-08-12 | Discharge: 2024-08-12 | Discharge status: Against Medical Advice

## 2024-08-12 NOTE — ED Notes (Signed)
 Pt lwbs and did not complete triage process.

## 2024-08-19 ENCOUNTER — Ambulatory Visit (HOSPITAL_COMMUNITY): Admission: EM | Admit: 2024-08-19 | Discharge: 2024-08-19 | Discharge status: Against Medical Advice

## 2024-08-19 ENCOUNTER — Emergency Department (HOSPITAL_COMMUNITY)
Admission: EM | Admit: 2024-08-19 | Discharge: 2024-08-19 | Disposition: A | Discharge status: Home / Self Care | Payer: Self-pay | Attending: Emergency Medicine | Admitting: Emergency Medicine

## 2024-08-19 ENCOUNTER — Ambulatory Visit (HOSPITAL_COMMUNITY)
Admission: EM | Admit: 2024-08-19 | Discharge: 2024-08-19 | Disposition: A | Discharge status: Home / Self Care | Attending: Nurse Practitioner | Admitting: Nurse Practitioner

## 2024-08-19 ENCOUNTER — Other Ambulatory Visit: Payer: Self-pay

## 2024-08-19 DIAGNOSIS — F1910 Other psychoactive substance abuse, uncomplicated: Secondary | ICD-10-CM | POA: Insufficient documentation

## 2024-08-19 DIAGNOSIS — J029 Acute pharyngitis, unspecified: Secondary | ICD-10-CM | POA: Insufficient documentation

## 2024-08-19 DIAGNOSIS — M25562 Pain in left knee: Secondary | ICD-10-CM | POA: Insufficient documentation

## 2024-08-19 DIAGNOSIS — Z765 Malingerer [conscious simulation]: Secondary | ICD-10-CM | POA: Insufficient documentation

## 2024-08-19 DIAGNOSIS — M25561 Pain in right knee: Secondary | ICD-10-CM | POA: Insufficient documentation

## 2024-08-19 DIAGNOSIS — Z5900 Homelessness unspecified: Secondary | ICD-10-CM | POA: Insufficient documentation

## 2024-08-19 LAB — RESP PANEL BY RT-PCR (RSV, FLU A&B, COVID)  RVPGX2
Influenza A by PCR: NEGATIVE
Influenza B by PCR: NEGATIVE
Resp Syncytial Virus by PCR: NEGATIVE
SARS Coronavirus 2 by RT PCR: NEGATIVE

## 2024-08-19 MED ORDER — KETOROLAC TROMETHAMINE 15 MG/ML IJ SOLN
15.0000 mg | Freq: Once | INTRAMUSCULAR | Status: AC
  Administered 2024-08-19: 15 mg via INTRAMUSCULAR
  Filled 2024-08-19: qty 1

## 2024-08-19 NOTE — ED Notes (Signed)
 Pt became verbally aggressive towards staff. Pt was not able to be triaged. Pt lwbs.

## 2024-08-19 NOTE — ED Provider Notes (Signed)
 [place] EMERGENCY DEPARTMENT AT [facility] Provider Note   CSN: [id number] Arrival date & time: 08/19/24  9443     Patient presents with: Leg Pain, Abrasion, and Sore Throat   Ethan Goodwin is a 33 y.o. male.   33 yo M with a chief complaints of bilateral knee pain and sore throat.  This been going on for couple days.  He denies cough congestion or fever.  Denies injury.  Knee pain worse with movement ambulation.  Sore throat worse with swallowing.  No known sick contacts.   Leg Pain Sore Throat       Prior to Admission medications   Medication Sig Start Date End Date Taking? Authorizing Provider  bictegravir-emtricitabine -tenofovir  AF (BIKTARVY ) 50-200-25 MG TABS tablet Take 1 tablet by mouth daily. 04/20/24   Waddell Alan PARAS, RPH-CPP  FLUoxetine  (PROZAC ) 10 MG capsule Take 1 capsule (10 mg total) by mouth daily. 03/27/24   Lenard Calin, MD  nicotine  (NICODERM CQ  - DOSED IN MG/24 HOURS) 21 mg/24hr patch Place 1 patch (21 mg total) onto the skin daily at 6 (six) AM. Patient not taking: Reported on 07/14/2024 03/27/24   Lenard Calin, MD  risperiDONE  (RISPERDAL ) 1 MG tablet Take 1 tablet (1 mg total) by mouth 2 (two) times daily. 03/26/24   Lenard Calin, MD  traZODone  (DESYREL ) 50 MG tablet Take 1 tablet (50 mg total) by mouth at bedtime as needed for sleep. Patient not taking: Reported on 07/14/2024 03/26/24   Lenard Calin, MD    Allergies: Patient has no known allergies.    Review of Systems  Updated Vital Signs BP 112/77   Pulse 95   Temp (!) 97.5 F (36.4 C) (Oral)   Resp 17   SpO2 100%   Physical Exam Vitals and nursing note reviewed.  Constitutional:      Appearance: He is well-developed.  HENT:     Head: Normocephalic and atraumatic.     Mouth/Throat:     Comments: Mild posterior oropharyngeal erythema no appreciable edema.  No tonsillar swelling or exudates.  No anterior cervical lymphadenopathy. Eyes:     Pupils: Pupils are  equal, round, and reactive to light.  Neck:     Vascular: No JVD.  Cardiovascular:     Rate and Rhythm: Normal rate and regular rhythm.     Heart sounds: No murmur heard.    No friction rub. No gallop.  Pulmonary:     Effort: No respiratory distress.     Breath sounds: No wheezing.  Abdominal:     General: There is no distension.     Tenderness: There is no abdominal tenderness. There is no guarding or rebound.  Musculoskeletal:        General: Normal range of motion.     Cervical back: Normal range of motion and neck supple.     Comments: Full range of motion of the knees bilaterally without obvious discomfort.  Negative McMurray's.  Skin:    Coloration: Skin is not pale.     Findings: No rash.  Neurological:     Mental Status: He is alert and oriented to person, place, and time.  Psychiatric:        Behavior: Behavior normal.     (all labs ordered are listed, but only abnormal results are displayed) Labs Reviewed  RESP PANEL BY RT-PCR (RSV, FLU A&B, COVID)  [id number]    EKG: None  Radiology: No results found.   Procedures   Medications Ordered in the ED  ketorolac  (TORADOL ) 15 MG/ML injection 15 mg (has no administration in time range)                                    Medical Decision Making Risk Prescription drug management.   33 yo M with a sore throat and knee pain.  Well-appearing nontoxic.  Reassuring exam.  Will treat supportively.  PCP follow-up.  7:45 AM:  I have discussed the diagnosis/risks/treatment options with the patient.  Evaluation and diagnostic testing in the emergency department does not suggest an emergent condition requiring admission or immediate intervention beyond what has been performed at this time.  They will follow up with PCP. We also discussed returning to the ED immediately if new or worsening sx occur. We discussed the sx which are most concerning (e.g., sudden worsening pain, fever, inability to tolerate by mouth) that  necessitate immediate return. Medications administered to the patient during their visit and any new prescriptions provided to the patient are listed below.  Medications given during this visit Medications  ketorolac  (TORADOL ) 15 MG/ML injection 15 mg (has no administration in time range)     The patient appears reasonably screen and/or stabilized for discharge and I doubt any other medical condition or other [facility] requiring further screening, evaluation, or treatment in the ED at this time prior to discharge.       Final diagnoses:  Pharyngitis, unspecified etiology    ED Discharge Orders     None          Emil Share, DO 08/19/24 0745

## 2024-08-19 NOTE — ED Provider Notes (Addendum)
 Behavioral Health Urgent Care Medical Screening Exam  Patient Name: Ethan Goodwin MRN: [id number] Date of Evaluation: 08/19/24 Chief Complaint:  Ethan Goodwin need to keep me.  Diagnosis:  Final diagnoses:  Homelessness  Substance abuse (HCC)  Malingering    History of Present illness: Ethan Goodwin is a 33 y.o. male.  Ethan Goodwin is a 33 y.o. male with psychiatric history of IV drug use, substance-induced mood disorder, meth amphetamine use disorder, malingering and homelessness, who presented voluntarily as a walk-in to [facility] requesting housing and detox from methamphetamine use.   Patient was seen face to face by this provider and chart reviewed. Patient is well known to the behavioral health service line with multiple visits, sometime several same day.   On presentation, he is irritable and continues expelling snort in the air and then spitting off/on during the evaluation. He declines to stop, when asked politely.  He reports using two grams of methamphetamine use two days ago and needs to detox. He endorses homelessness.   During triage he reported SI, no plan or intent. When probed further, he is dismissive when asked details about suicidal thoughts to assess for risk and severity and curtly replies, I'm not and that's not why I'm here, I'm homeless and I have HIV, I'm not leaving, I need the medications ya'll gave me last time, ya'll need to keep me.   Patient asked the medications he is currently taking, he replies  I don't know, don't ask me.   He reports being established for outpatient med management with the [facility] walk in clinic. Patient was asked when last he took his medications or had a visit with his provider, he angrily replies  I don't know, that's not why I'm here.   He is now argumentative, then continues coughing up snort and spitting around him.   He is now ranting, talking about  ya'll owe me and I'm not leaving, you'll see......... Further evaluation  questions are answered with  I don't know or huffing and spiting.  Security on standby.  Discussed recommendation for discharge and follow up with outpatient substance abuse program and [facility] outpatient walk in clinic for medication management. No s/s of substance withdrawals noted.   On evaluation, patient is alert, oriented x 3, and uncooperative. Speech is clear and coherent. Pt appears appropriately dressed. Eye contact is good. Mood is irritable, affect is congruent with mood. Thought process is coherent and goal directed and thought content is WDL. Pt denies SI/HI/AVH. There is no objective indication that the patient is responding to internal stimuli. No delusions elicited during this assessment.   Flowsheet Row ED from 08/19/2024 in [facility] Emergency Department at [facility] Most recent reading at 08/19/2024  6:04 AM ED from 08/03/2024 in [facility] Emergency Department at [facility] Most recent reading at 08/03/2024  5:14 AM ED from 08/03/2024 in [facility] Emergency Department at [facility] Most recent reading at 08/03/2024  1:50 AM  C-SSRS RISK CATEGORY No Risk No Risk No Risk    Psychiatric Specialty Exam  Presentation  General Appearance:Appropriate for Environment  Eye Contact:Good  Speech:Clear and Coherent  Speech Volume:Normal  Handedness:Right   Mood and Affect  Mood: Irritable  Affect: Congruent   Thought Process  Thought Processes: Coherent; Goal Directed  Descriptions of Associations:Intact  Orientation:Full (Time, Place and Person)  Thought Content:WDL  Diagnosis of Schizophrenia or Schizoaffective disorder in past: No  Duration of Psychotic Symptoms: None Hallucinations:None  Ideas of Reference:None  Suicidal  Thoughts:No  Homicidal Thoughts:No   Sensorium  Memory: Immediate Good  Judgment: Poor  Insight: Poor   Executive Functions  Concentration: Good  Attention  Span: Good  Recall: Good  Fund of Knowledge: Good  Language: Good   Psychomotor Activity  Psychomotor Activity: Mannerisms   Assets  Assets: Communication Skills; Desire for Improvement   Sleep  Sleep: Fair  Number of hours:  6   Physical Exam: Physical Exam Constitutional:      General: He is not in acute distress.    Appearance: He is not diaphoretic.  Pulmonary:     Effort: No respiratory distress.  Chest:     Chest wall: No tenderness.  Neurological:     Mental Status: He is alert and oriented to person, place, and time.  Psychiatric:        Attention and Perception: Attention and perception normal.        Mood and Affect: Mood is anxious. Affect is inappropriate.        Speech: Speech normal.        Behavior: Behavior is cooperative.        Thought Content: Thought content normal.    Review of Systems  Constitutional:  Negative for chills, diaphoresis and fever.  HENT:  Negative for congestion.   Eyes:  Negative for discharge.  Respiratory:  Negative for cough, shortness of breath and wheezing.   Cardiovascular:  Negative for chest pain and palpitations.  Gastrointestinal:  Negative for diarrhea, nausea and vomiting.  Neurological:  Negative for dizziness, seizures, weakness and headaches.  Psychiatric/Behavioral:  Positive for substance abuse. The patient is nervous/anxious.     Musculoskeletal: Strength & Muscle Tone: within normal limits Gait & Station: normal Patient leans: N/A   [facility] MSE Discharge Disposition for Follow up and Recommendations: Based on my evaluation the patient does not appear to have an emergency medical condition and can be discharged with resources and follow up care in outpatient services for Substance Abuse Intensive Outpatient Program  Patient denies SI/HI/AVH or paranoia. Patient does not meet inpatient  psychiatric admission criteria or IVC criteria at this time. There is no evidence of imminent risk of harm to  self or others.   Recommend discharge and follow up with outpatient substance abuse program.   Discharge recommendations:  Patient is to take medications as prescribed. Please follow up with your primary care provider for all medical related needs.   Therapy: We recommend that patient participate in individual therapy to address mental health concerns.  Medications: The patient or guardian is to contact a medical professional and/or outpatient provider to address any new side effects that develop. The patient or guardian should update outpatient providers of any new medications and/or medication changes.   Atypical antipsychotics: If you are prescribed an atypical antipsychotic, it is recommended that your height, weight, BMI, blood pressure, fasting lipid panel, and fasting blood sugar be monitored by your outpatient providers.  Safety:  The patient should abstain from use of illicit substances/drugs and abuse of any medications. If symptoms worsen or do not continue to improve or if the patient becomes actively suicidal or homicidal then it is recommended that the patient return to the closest hospital emergency department, the [facility], or call 911 for further evaluation and treatment. National Suicide Prevention Lifeline 1-800-SUICIDE or 863-327-5088.  About 988 988 offers 24/7 access to trained crisis counselors who can help people experiencing mental health-related distress. People can call or text 988 or chat  988lifeline.org for themselves or if they are worried about a loved one who may need crisis support.  Crisis Mobile: [facility] [facility]:                     703-369-1729 (for crisis response 24 hours a day) [facility] Hotline:                                            548-388-6852  Patient discharged in stable condition.  Thurman LULLA Ivans, NP 08/19/2024, 11:24 PM

## 2024-08-19 NOTE — Discharge Instructions (Addendum)
 Take tylenol  2 pills 4 times a day and motrin 4 pills 3 times a day.  Drink plenty of fluids.  Return for worsening shortness of breath, headache, confusion. Follow up with your family doctor.   In the paper work is information for follow up with a provider who specializes in people who do not have a place to stay

## 2024-08-19 NOTE — ED Triage Notes (Signed)
 Patient brought in by EMS. Per EMS patient is homeless and c/o right leg pain x 1 week, a sore throat and abrasions on his back. Hx of schizophrenia and drug abuse
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
## 2024-08-19 NOTE — Discharge Instructions (Signed)

## 2024-08-22 ENCOUNTER — Emergency Department (HOSPITAL_COMMUNITY)
Admission: EM | Admit: 2024-08-22 | Discharge: 2024-08-22 | Disposition: A | Discharge status: Home / Self Care | Payer: Self-pay | Attending: Emergency Medicine | Admitting: Emergency Medicine

## 2024-08-22 ENCOUNTER — Other Ambulatory Visit: Payer: Self-pay

## 2024-08-22 ENCOUNTER — Encounter (HOSPITAL_COMMUNITY): Payer: Self-pay

## 2024-08-22 DIAGNOSIS — R443 Hallucinations, unspecified: Secondary | ICD-10-CM | POA: Diagnosis not present

## 2024-08-22 DIAGNOSIS — R45851 Suicidal ideations: Secondary | ICD-10-CM | POA: Diagnosis not present

## 2024-08-22 DIAGNOSIS — Z765 Malingerer [conscious simulation]: Secondary | ICD-10-CM | POA: Insufficient documentation

## 2024-08-22 DIAGNOSIS — Z21 Asymptomatic human immunodeficiency virus [HIV] infection status: Secondary | ICD-10-CM | POA: Diagnosis not present

## 2024-08-22 DIAGNOSIS — R451 Restlessness and agitation: Secondary | ICD-10-CM | POA: Insufficient documentation

## 2024-08-22 DIAGNOSIS — L03119 Cellulitis of unspecified part of limb: Secondary | ICD-10-CM

## 2024-08-22 DIAGNOSIS — L03116 Cellulitis of left lower limb: Secondary | ICD-10-CM | POA: Insufficient documentation

## 2024-08-22 DIAGNOSIS — L03115 Cellulitis of right lower limb: Secondary | ICD-10-CM | POA: Diagnosis not present

## 2024-08-22 LAB — URINE DRUG SCREEN
Amphetamines: POSITIVE — AB
Barbiturates: NEGATIVE
Benzodiazepines: NEGATIVE
Cocaine: NEGATIVE
Fentanyl: NEGATIVE
Methadone Scn, Ur: NEGATIVE
Opiates: NEGATIVE
Tetrahydrocannabinol: NEGATIVE

## 2024-08-22 MED ORDER — DOXYCYCLINE HYCLATE 100 MG PO CAPS: 100.0000 mg | ORAL_CAPSULE | Freq: Two times a day (BID) | ORAL | 0 refills | Status: DC

## 2024-08-22 MED ORDER — DOXYCYCLINE HYCLATE 100 MG PO TABS
100.0000 mg | ORAL_TABLET | Freq: Once | ORAL | Status: AC
  Administered 2024-08-22: 100 mg via ORAL
  Filled 2024-08-22: qty 1

## 2024-08-22 NOTE — ED Triage Notes (Addendum)
 Patient brought in by EMS for hallucinations and SI. Reports he wanted to jump off of a bridge. Also stated he has thoughts of hurting other people Also reports infection in his legs. Patients has some skin peeling and scars on the bilateral lower legs. Patient is laughing and talking to himself and appears to respond to external stimuli.

## 2024-08-22 NOTE — ED Notes (Addendum)
 Patient refused all labs and stated I'm not suicidal, I just want some medications for my legs.

## 2024-08-22 NOTE — ED Notes (Signed)
 Patient changed into burgundy scrubs and belonging placed in cabinet.

## 2024-08-22 NOTE — ED Provider Notes (Signed)
 [place] EMERGENCY DEPARTMENT AT [facility] Provider Note   CSN: [id number] Arrival date & time: 08/22/24  9491     Patient presents with: Hallucinations   Ethan Goodwin is a 33 y.o. male with history of HIV, secondary syphilis, IV drug use, abscess of forearm of right, methamphetamine abuse.  Presents to ED complaining of infection of legs as well as suicidal ideation and hallucinations.  Patient reports infection of legs has been noticed in the last 3 days.  Endorses IV drug use, reports he does inject in this area.  Denies fevers at home, nausea or vomiting.  Also reporting suicidal ideation.  States that he plans on jumping off of a bridge.  He reports that he sometimes thinks about hurting other people but denies that he has any homicidal ideation at this time. Denies audio or visual hallucinations.  He did advise triage RN that he was hallucinating but when asked about this the patient denies this to me.  Reports history of schizophrenia, denies that he is compliant on medications.  Denies chest pain, shortness of breath, abdominal pain.  Recently seen at [facility] and diagnosed with malingering.  HPI     Prior to Admission medications   Medication Sig Start Date End Date Taking? Authorizing Provider  doxycycline  (VIBRAMYCIN ) 100 MG capsule Take 1 capsule (100 mg total) by mouth 2 (two) times daily. 08/22/24  Yes Ruthell Lonni FALCON, PA-C  bictegravir-emtricitabine -tenofovir  AF (BIKTARVY ) 50-200-25 MG TABS tablet Take 1 tablet by mouth daily. 04/20/24   Waddell Alan PARAS, RPH-CPP  FLUoxetine  (PROZAC ) 10 MG capsule Take 1 capsule (10 mg total) by mouth daily. 03/27/24   Lenard Calin, MD  nicotine  (NICODERM CQ  - DOSED IN MG/24 HOURS) 21 mg/24hr patch Place 1 patch (21 mg total) onto the skin daily at 6 (six) AM. Patient not taking: Reported on 07/14/2024 03/27/24   Lenard Calin, MD  risperiDONE  (RISPERDAL ) 1 MG tablet Take 1 tablet (1 mg total) by mouth 2 (two) times  daily. 03/26/24   Lenard Calin, MD  traZODone  (DESYREL ) 50 MG tablet Take 1 tablet (50 mg total) by mouth at bedtime as needed for sleep. Patient not taking: Reported on 07/14/2024 03/26/24   Lenard Calin, MD    Allergies: Patient has no known allergies.    Review of Systems  All other systems reviewed and are negative.   Updated Vital Signs BP 112/82 (BP Location: Right Arm)   Pulse 97   Temp 97.8 F (36.6 C) (Oral)   Resp 16   Ht 5' 7 (1.702 m)   Wt 64.9 kg   SpO2 99%   BMI 22.40 kg/m   Physical Exam Vitals and nursing note reviewed.  Constitutional:      General: He is not in acute distress.    Appearance: He is well-developed.  HENT:     Head: Normocephalic and atraumatic.  Eyes:     Conjunctiva/sclera: Conjunctivae normal.  Cardiovascular:     Rate and Rhythm: Normal rate and regular rhythm.     Heart sounds: No murmur heard. Pulmonary:     Effort: Pulmonary effort is normal. No respiratory distress.     Breath sounds: Normal breath sounds.  Abdominal:     Palpations: Abdomen is soft.     Tenderness: There is no abdominal tenderness.  Musculoskeletal:        General: No swelling.     Cervical back: Neck supple.  Skin:    General: Skin is warm and dry.  Capillary Refill: Capillary refill takes less than 2 seconds.  Neurological:     Mental Status: He is alert and oriented to person, place, and time. Mental status is at baseline.  Psychiatric:        Behavior: Behavior is agitated and combative.     (all labs ordered are listed, but only abnormal results are displayed) Labs Reviewed  URINE DRUG SCREEN    EKG: None  Radiology: No results found.  Procedures   Medications Ordered in the ED  doxycycline  (VIBRA -TABS) tablet 100 mg (100 mg Oral Given 08/22/24 0537)    Medical Decision Making Amount and/or Complexity of Data Reviewed Labs: ordered. ECG/medicine tests: ordered.  Risk Prescription drug management.   This is a  33 year old male presenting to the ED stating he has infection in legs, he reports that he is suicidal.  He was seen 2 days ago at the behavioral health urgent care where he was diagnosed with malingering and ultimately discharged with outpatient resources after giving a very similar story.  Here, the patient is alert and oriented x 4.  He does not appear to responding to internal stimuli.  He is afebrile and nontachycardic.  His vital signs are reassuring.  Abdomen soft and compressible.  Neuroexam at baseline.  Spoke with patient regarding presentation.  Advised patient that he was seen 2 days ago at [facility] and ultimately diagnosed with malingering.  Advised patient that I was happy to assist him with receiving TTS evaluation this evening.  I provided the patient with a dose of doxycycline  for suspected cellulitis to lower extremities.  I advised the patient that we would require laboratory evaluation prior to TTS evaluation for medical clearance.  At that time, the patient became very irate.  He demanded to know his lab results from his previous hospital visit on 11/04.  I went through the patient results with him.  I then advised the patient that we would need to draw labs to medically clear him.  He then stated to me, why do I have to give blood, you guys already have my blood from that visit.  I advised the patient that he told me he was suicidal and this is a part of medically clearing him so that he can talk to a psychiatric divider.  He then went on to state I am not suicidal, I just came in to have a place to stay in for antibiotics for my legs.  I then continued to discuss with the patient the fact that he did tell me he was suicidal but he now adamantly denies that he is suicidal at this time.  Reports to me that he needed a place to stay, he will not give us  blood.  He now denies that he is suicidal.  He is alert and oriented x 4.  Not responding to internal stimuli.  Patient discharged at this time  with outpatient resources, prescription for doxycycline .  He was advised to follow-up outpatient with the [facility] and his primary care provider.  He voiced understanding.  Stable to discharge.    Final diagnoses:  Malingering  Cellulitis of lower extremity, unspecified laterality    ED Discharge Orders          Ordered    doxycycline  (VIBRAMYCIN ) 100 MG capsule  2 times daily        08/22/24 9378               Ruthell Lonni FALCON, PA-C 08/22/24 ZOILA Burner,  Almarie, MD 08/22/24 [phone]

## 2024-08-22 NOTE — BH Assessment (Signed)
 Clinician referred pt to [facility], however due to time constraints [facility] deferred pt back to the in-house provider.   Their next appointment is at 0900 which is after coverage hours.    Ethan JONETTA Broach, MS, [facility], [facility] Triage Specialist [phone]

## 2024-08-22 NOTE — Discharge Instructions (Signed)
 Follow-up with [facility].  Begin taking doxycycline  twice a day for 7 days for infection of legs.  Discontinue IV drug use.  Return to ED with new symptoms.

## 2024-08-22 NOTE — ED Notes (Signed)
 Attempted to draw labs from patient as soon as I stuck him he stated take it out now it hurt to much'. Everywhere else I attempted to stick patient refused. EDP made aware.

## 2024-08-23 ENCOUNTER — Emergency Department (HOSPITAL_COMMUNITY)
Admission: EM | Admit: 2024-08-23 | Discharge: 2024-08-24 | Disposition: A | Discharge status: Home / Self Care | Payer: Self-pay | Attending: Emergency Medicine | Admitting: Emergency Medicine

## 2024-08-23 ENCOUNTER — Other Ambulatory Visit: Payer: Self-pay

## 2024-08-23 ENCOUNTER — Encounter (HOSPITAL_COMMUNITY): Payer: Self-pay | Admitting: Emergency Medicine

## 2024-08-23 DIAGNOSIS — F1994 Other psychoactive substance use, unspecified with psychoactive substance-induced mood disorder: Secondary | ICD-10-CM | POA: Diagnosis present

## 2024-08-23 DIAGNOSIS — Z765 Malingerer [conscious simulation]: Secondary | ICD-10-CM | POA: Insufficient documentation

## 2024-08-23 DIAGNOSIS — Z5900 Homelessness unspecified: Secondary | ICD-10-CM | POA: Insufficient documentation

## 2024-08-23 DIAGNOSIS — Z008 Encounter for other general examination: Secondary | ICD-10-CM | POA: Diagnosis present

## 2024-08-23 DIAGNOSIS — Z5329 Procedure and treatment not carried out because of patient's decision for other reasons: Secondary | ICD-10-CM | POA: Diagnosis not present

## 2024-08-23 DIAGNOSIS — Z21 Asymptomatic human immunodeficiency virus [HIV] infection status: Secondary | ICD-10-CM | POA: Insufficient documentation

## 2024-08-23 DIAGNOSIS — F1594 Other stimulant use, unspecified with stimulant-induced mood disorder: Secondary | ICD-10-CM | POA: Diagnosis present

## 2024-08-23 DIAGNOSIS — F418 Other specified anxiety disorders: Secondary | ICD-10-CM | POA: Insufficient documentation

## 2024-08-23 DIAGNOSIS — R7401 Elevation of levels of liver transaminase levels: Secondary | ICD-10-CM | POA: Diagnosis not present

## 2024-08-23 DIAGNOSIS — R45851 Suicidal ideations: Secondary | ICD-10-CM | POA: Diagnosis not present

## 2024-08-23 DIAGNOSIS — R7309 Other abnormal glucose: Secondary | ICD-10-CM | POA: Insufficient documentation

## 2024-08-23 DIAGNOSIS — Z7689 Persons encountering health services in other specified circumstances: Secondary | ICD-10-CM

## 2024-08-23 HISTORY — DX: Schizophrenia, unspecified: F20.9
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
## 2024-08-24 ENCOUNTER — Encounter (HOSPITAL_COMMUNITY): Payer: Self-pay | Admitting: Emergency Medicine

## 2024-08-24 ENCOUNTER — Emergency Department (HOSPITAL_COMMUNITY)
Admission: EM | Admit: 2024-08-24 | Discharge: 2024-08-24 | Discharge status: Against Medical Advice | Payer: Self-pay | Source: Home / Self Care

## 2024-08-24 ENCOUNTER — Encounter (HOSPITAL_COMMUNITY): Payer: Self-pay

## 2024-08-24 ENCOUNTER — Other Ambulatory Visit: Payer: Self-pay

## 2024-08-24 ENCOUNTER — Emergency Department (HOSPITAL_COMMUNITY)
Admission: EM | Admit: 2024-08-24 | Discharge: 2024-08-24 | Disposition: A | Discharge status: Home / Self Care | Payer: Self-pay

## 2024-08-24 ENCOUNTER — Emergency Department (HOSPITAL_COMMUNITY)
Admission: EM | Admit: 2024-08-24 | Discharge: 2024-08-25 | Disposition: A | Discharge status: Home / Self Care | Payer: Self-pay | Source: Home / Self Care | Attending: Emergency Medicine | Admitting: Emergency Medicine

## 2024-08-24 DIAGNOSIS — R45851 Suicidal ideations: Secondary | ICD-10-CM | POA: Insufficient documentation

## 2024-08-24 DIAGNOSIS — Z5900 Homelessness unspecified: Secondary | ICD-10-CM | POA: Insufficient documentation

## 2024-08-24 DIAGNOSIS — Z765 Malingerer [conscious simulation]: Secondary | ICD-10-CM | POA: Diagnosis not present

## 2024-08-24 DIAGNOSIS — Z008 Encounter for other general examination: Secondary | ICD-10-CM | POA: Insufficient documentation

## 2024-08-24 DIAGNOSIS — Z21 Asymptomatic human immunodeficiency virus [HIV] infection status: Secondary | ICD-10-CM | POA: Insufficient documentation

## 2024-08-24 DIAGNOSIS — Z5329 Procedure and treatment not carried out because of patient's decision for other reasons: Secondary | ICD-10-CM | POA: Insufficient documentation

## 2024-08-24 DIAGNOSIS — F1994 Other psychoactive substance use, unspecified with psychoactive substance-induced mood disorder: Secondary | ICD-10-CM | POA: Diagnosis not present

## 2024-08-24 LAB — CBC
HCT: 42.3 % (ref 39.0–52.0)
Hemoglobin: 13.7 g/dL (ref 13.0–17.0)
MCH: 30.7 pg (ref 26.0–34.0)
MCHC: 32.4 g/dL (ref 30.0–36.0)
MCV: 94.8 fL (ref 80.0–100.0)
Platelets: 177 K/uL (ref 150–400)
RBC: 4.46 MIL/uL (ref 4.22–5.81)
RDW: 14.3 % (ref 11.5–15.5)
WBC: 4.8 K/uL (ref 4.0–10.5)
nRBC: 0 % (ref 0.0–0.2)

## 2024-08-24 LAB — COMPREHENSIVE METABOLIC PANEL WITH GFR
ALT: 48 U/L — ABNORMAL HIGH (ref 0–44)
AST: 43 U/L — ABNORMAL HIGH (ref 15–41)
Albumin: 3.5 g/dL (ref 3.5–5.0)
Alkaline Phosphatase: 76 U/L (ref 38–126)
Anion gap: 12 (ref 5–15)
BUN: 19 mg/dL (ref 6–20)
CO2: 26 mmol/L (ref 22–32)
Calcium: 9 mg/dL (ref 8.9–10.3)
Chloride: 104 mmol/L (ref 98–111)
Creatinine, Ser: 1.07 mg/dL (ref 0.61–1.24)
GFR, Estimated: >60 mL/min (ref >60)
Glucose, Bld: 107 mg/dL — ABNORMAL HIGH (ref 70–99)
Potassium: 3.9 mmol/L (ref 3.5–5.1)
Sodium: 142 mmol/L (ref 135–145)
Total Bilirubin: 0.7 mg/dL (ref 0.0–1.2)
Total Protein: 7.1 g/dL (ref 6.5–8.1)

## 2024-08-24 LAB — RAPID URINE DRUG SCREEN, HOSP PERFORMED
Amphetamines: POSITIVE — AB
Barbiturates: NOT DETECTED
Benzodiazepines: NOT DETECTED
Cocaine: NOT DETECTED
Opiates: NOT DETECTED
Tetrahydrocannabinol: NOT DETECTED

## 2024-08-24 LAB — ETHANOL: Alcohol, Ethyl (B): <15 mg/dL (ref <15)
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
## 2024-08-24 NOTE — Discharge Instructions (Signed)
 [facility]  Care you can count on. [facility] offers free case management services to individuals living with HIV in [place]. No matter the level of service, our dedicated team is here to make your journey through the medical system a little easier.  Call [phone]. [phone] to enroll in care.  We offer three different levels of case management services: Medical Case Management  Your case manager will contact you regularly to make sure there are no barriers to taking your medicines and going to your doctor. You will also receive support in applying for medication assistance programs, such as HMAP. You are eligible to receive bus passes for doctors appointments and access to our client food pantry. In the event of an emergency, you may be able to receive financial assistance.  [employer]  If you are virally suppressed and in regular care you might not need a regular check-in from your case production designer, theatre/television/film. You will still be able to receive bus passes, food from our pantry, and are eligible for emergency financial assistance. You will also receive support in applying for medication assistance programs, such as HMAP.  [facility] Assistance Program (TAP)  For individuals that are virally suppressed and very low need, this program allows you access to the [facility] food pantry, and bus passes, but does not offer case management services or financial assistance.  [place] [address] [place], [place] [address] [phone]  Fax: [phone] [address] [facility] [address] [place], [place], [address] [phone] phone  Offers food and emergency or transitional housing to men, women, or families in need. Clients participate in programs and workshops developed to promote self-sufficiency and personal development.Call or walk in. Applications are accepted Monday, Wednesday, and Friday by appointment only. Need photo ID and proof of  income.  [facility] [address], [place], [place] [address] [phone] Population served: Adult men & women (34 years old and older, able to perform activities for daily living) Documents required: Valid ID & Social Security Card  [facility] [place] Evetta Hick [address], [place], [place]  [address] [phone] Population served: Male veterans 18+ with substance abuse/mental health issues Eligibility: By referral only  [facility] [address], [place], [place] [address] [phone] Population served: Males 18+ Documents required: Valid ID & Social Security Card  [facility] [address] [place], [place] [address] [phone] Population served: Men 18+, preference for disabled and/or veterans Eligibility: By referral only             Substance Abuse Treatment Programs  Intensive Outpatient Programs [facility]     [address]      [place], [place]                   [phone]       [facility] [address] [place] [address] [place], [place] [phone]  [facility]     (Inpatient and outpatient)     [address]           [phone]    [facility] [phone] (Suboxone and Methadone)  [address]      [place], [place] [address]      [phone]       [address] [place], [place] [phone]  [facility]  [address] Chief Financial Officer, Chemical)    (insurance only) [phone]             [facility] (Groups & Residential) [place], [place] [phone]     [facility]     [address]     [place], [place]      [phone]       [facility] (for caregivers and family) [phone] [place], [place] [phone]      Residential Treatment Programs [facility]      [address] [place], [place]  [address]  [phone]       [facility] [address], [place], [place] [address] [phone]  [facility] [place]        [phone]       [facility] [place] [phone]  [facility]             [address]                                         [place], [place]                                                [phone] or [phone]                               [facility] [place] [address] [place], [address] [phone]  [facility]    [address]      [place], [place]     [phone]       [facility] [place] [address] [place], [place] [phone]  [facility]   [address] [place], [place] [address]     [phone]      Admissions: 8am-3pm M-F  [facility] Services (RTS) [address] [place], [place] [phone]  [facility]: Residential Program [phone] Days)   [place], [place]      [phone] or [phone]     [facility]: [place]  [facility] [place], [place] (Walk in Hours over the weekend or by referral)  [facility] [address] [place], [place], [place] [address] [phone]  Crisis Mobile: [facility]:  [phone] (for crisis response 24 hours a day) [facility] Hotline:      [phone] Outpatient Psychiatry and Counseling  [facility]: Mobile Crisis Management 24 hours:  [phone]  [facility] [employer] sliding scale fee and walk in schedule: M-F 8am-12pm/1pm-3pm [address]  [place], [place] [address] [phone]  [facility] [address] [place], [place] [address] [phone]  [facility] (Formerly [facility] as [facility] [employer])- new patient walk-in appointments available Monday - Friday 8am -3pm.          [address] [place], [place] [address] [phone] or crisis line- [phone]  [facility]  Outpatient Services/ Intensive Outpatient Therapy Program [address] [place], [place] [address] [phone]  [facility]                  Crisis Services      [phone] [address]  [place], [place] [address]                 [facility]   [facility] [phone]. [address] [place], [place] [address]   [facility] [facility]          [address] [place]  [place], [place] [address]       [phone]  [facility] [address] [place], [place] [address] [phone]  [facility]    [address]    [place], [place] [address]     [phone]       Ima Anon, MD     [address]     [place] [address]     [phone]       [facility] [address] [place] [address]  [facility]     [address]     [place], [place]      [phone]       [facility] Rocky [place], MD [address] [place], [place] [address] [phone]  [facility]     [address]     [place], [place] [address]     [phone]       [facility] [address] [place], [place] [address] [phone] Resources for Temporary Residential Assistance/Crisis Centers  [facility]) M-F 8am-3pm   [address]  [place], [place] [address]   [phone] Services include: laundry, barbering, support groups, case management, phone  & computer access, showers, AA/NA mtgs, mental health/substance abuse nurse, job skills class, disability information, VA assistance, spiritual classes, etc.   HOMELESS SHELTERS  [facility]     [facility]   [address] [place]     [phone]        [facility]   [address]    [place]  [address]     [phone]                    [facility] [address] [place], [place] [address] [phone] application appt.) Application Required  [employer] (men only)     [address]      [place], [place]     [phone]       [facility]      [address]      [place], [place] [address]     [phone]             [facility] [address] [place], [place] [phone] 90 day commitment/SA/Application process  [facility](men only)     [address]     [place], [place]     [phone]       Check-in at [facility] [address] [place], [place] [address] [phone] Men/Women/Women and Children must be there by 7 pm  [facility] [place], [place] [phone]
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
## 2024-08-24 NOTE — ED Triage Notes (Signed)
 Pt arrives via the Behavorial health response team with [employer] with c/o suicidal ideation with a plan to walk into traffic. He is homeless and has no where to go and he feels like no one is listening to him. Denies A/V hallucinations and denies illegal drug use.  He was just discharged from [facility] a couple of hours ago.

## 2024-08-24 NOTE — ED Notes (Signed)
 Pt up to use restroom and requesting blanket

## 2024-08-24 NOTE — BH Assessment (Signed)
 Patient was deferred to IRIS for a telepsych assessment. The assigned care coordinator will provide updates regarding the scheduling of the assessment. IRIS coordinator can be reached at [phone] for further information on the timing of the telepsych evaluation.

## 2024-08-24 NOTE — ED Provider Notes (Signed)
 [place] EMERGENCY DEPARTMENT AT [facility] Provider Note   CSN: [id number] Arrival date & time: 08/24/24  1302     Patient presents with: No chief complaint on file.   Ethan Goodwin is a 33 y.o. male.   The history is provided by the patient and medical records. No language interpreter was used.     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  Patient states he is suicidal and would like to get help with housing.  He was just recently seen in the ER earlier today for same and has been fully evaluated by psychiatry team and felt to be stable for discharge and does not meet inpatient criteria for psychiatric illness.  Patient was provided with outpatient resources but patient declined stating adamantly that he wants the ED to secure housing for him  Prior to Admission medications   Medication Sig Start Date End Date Taking? Authorizing Provider  bictegravir-emtricitabine -tenofovir  AF (BIKTARVY ) 50-200-25 MG TABS tablet Take 1 tablet by mouth daily. 04/20/24   Waddell Alan PARAS, RPH-CPP  doxycycline  (VIBRAMYCIN ) 100 MG capsule Take 1 capsule (100 mg total) by mouth 2 (two) times daily. Patient not taking: Reported on 08/24/2024 08/22/24   Ruthell Lonni FALCON, PA-C  risperiDONE  (RISPERDAL ) 1 MG tablet Take 1 tablet (1 mg total) by mouth 2 (two) times daily. Patient taking differently: Take 1 mg by mouth at bedtime. 03/26/24   Lenard Calin, MD  traZODone  (DESYREL ) 50 MG tablet Take 1 tablet (50 mg total) by mouth at bedtime as needed for sleep. Patient taking differently: Take 50 mg by mouth at bedtime. 03/26/24   Lenard Calin, MD    Allergies: Patient has no known allergies.    Review of Systems  All other systems reviewed and are negative.   Updated Vital Signs BP 115/83 (BP Location: Right Arm)   Pulse 94   Temp 97.7 F (36.5 C)   Resp 18   SpO2  100%   Physical Exam Constitutional:      General: He is not in acute distress.    Appearance: He is well-developed.  HENT:     Head: Atraumatic.  Eyes:     Conjunctiva/sclera: Conjunctivae normal.  Musculoskeletal:     Cervical back: Normal range of motion and neck supple.  Skin:    Findings: No rash.  Neurological:     Mental Status: He is alert.  Psychiatric:        Mood and Affect: Mood normal.        Speech: Speech normal.        Behavior: Behavior is cooperative.     (all labs ordered are listed, but only abnormal results are displayed) Labs Reviewed - No data to display  EKG: None  Radiology: No results found.   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  Patient states he is suicidal and would like to get help  with housing.  He was just recently seen in the ER earlier today for same and has been fully evaluated by psychiatry team and felt to be stable for discharge and does not meet inpatient criteria for psychiatric illness.  Patient was provided with outpatient resources but patient declined stating adamantly that he wants the ED to secure housing for him  Patient is resting comfortably in no acute discomfort.  Not appearing to be responding to either internal or external stimuli.  EMR reviewed patient was seen and evaluated this morning for same.  He has been seen in the ED multiple times for similar presentation with evidence of secondary gain and malingering.  Currently vital signs stable.  I have also reviewed patient's previous labs that was done earlier this morning and without any concerning finding.  UDS is  positive for amphetamine  Will provide patient with resources for shelters.  Otherwise patient is stable for discharge.  Suspect malingering.     Final diagnoses:  Encounter for psychological evaluation    ED Discharge Orders     None          Nivia Colon, PA-C 08/24/24 1701    Neysa Caron PARAS, DO 09/14/24 1428

## 2024-08-24 NOTE — Consult Note (Addendum)
 [facility] Psychiatric Consult Initial  Patient Name: .Ethan Goodwin  MRN: [id number]  DOB: 1990/10/05  Consult Order details:  Orders (From admission, onward)     Start     Ordered   08/24/24 0444  CONSULT TO CALL ACT TEAM       Ordering Provider: Ruthell Ethan FALCON, PA-C  Provider:  (Not yet assigned)  Question:  Reason for Consult?  Answer:  Psych consult   08/24/24 0443             Mode of Visit: In person    Psychiatry Consult Evaluation  Service Date: August 24, 2024 LOS:  LOS: 0 days  Chief Complaint suicidal ideation  Primary Psychiatric Diagnoses  Homelessness 2.  Malingering 3.  Suicidal ideations 4.  Substance-induced mood disorder  Assessment  Ethan Goodwin is a 33 y.o. male admitted: Presented to the EDfor 08/23/2024 11:11 PM for suicidal ideation. 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   Patient is well-known to the service line with long history of frequent ED visits and prior admissions to the facility based crisis center.  Chart review indicates multiple previous referrals to [facility], [facility], outpatient behavioral health services and various housing resources.  Patient has not followed up with any of the recommended services.  During today's assessment patient reports suicidal ideation but is unable to identify a plan or intent and consistently redirects the conversation to housing.  When discussing outpatient follow-up with [facility] is walk-in in the morning patient verbalized refusal and states he does not want outpatient services, medication management, or substance abuse treatment.  He reports I just need housing.  When informed of available community resources, patient states he will not pursue them and insist that the ED secure housing for him.  He then states he should be sent to the psychiatric hospital so they can find housing for him.   Education provided that psychiatric hospitalization does not function as a housing resource.  Also explained to patient that he would need to follow-up personally with housing resources as they need personal information from him that the hospital cannot provide.  Patient states, yeah I am not going to do that.  He demonstrates no willingness to independently follow-up with provided resources and continues to insist that staff or the ED do it for him.  Upon being told he would likely be discharged patient states, I would rather be dead and he became irritable stating I need to be admitted.  Though this appears conditional and aimed at obtaining shelter or psychiatric admission.  This presentation is consistent with previous visits, where similar conditional statements were made around housing request. Patient appears to be forward thinking and goal directed towards obtaining shelter or psychiatric admission rather than reflecting imminent intent.  He denies any current plan or intent.  He denies HI/AVH. No acute psychosis, intoxication, or psychiatric decompensation observed.  Patient does not appear to be in acute psychiatric crisis at this time.  Suicidal states symptoms are conditional, contingent upon unmet housing needs, and consistent with prior presentations.  Appear to be chronic behavioral patterns for this patient per prior encounters.  Patient denies access to firearms/weapons.  No evidence of imminent danger to self or others.    Discussed outpatient resources and follow-up.  He is refusing all recommended psychiatric, substance use, and outpatient resources as well as medication management.  He remains focused solely on  obtaining housing and acknowledges he will not follow through with any offered services.  Based on current presentation, patient does not meet criteria for psychiatric admission or involuntary commitment.  Encourage patient to return to the ED for worsening symptoms or true  psychiatric emergency.  Case consulted with Dr. Larina who agrees with recommendation for psychiatric clearance.  Diagnoses:  Active Hospital problems: Active Problems:   Substance induced mood disorder (HCC)   Homelessness    Plan   ## Psychiatric Medication Recommendations: \ Pt refused meds  ## Medical Decision Making Capacity: Not specifically addressed in this encounter  ## Further Work-up:  -- none  -- most recent EKG on 08/24/2024 had QtC of 442 -- Pertinent labwork reviewed earlier this admission includes: UDS positive for amphetamines, BAL<15, CBC, CMP,   ## Disposition:-- There are no psychiatric contraindications to discharge at this time  ## Behavioral / Environmental: -Utilize compassion and acknowledge the patient's experiences while setting clear and realistic expectations for care.    ## Safety and Observation Level:  - Based on my clinical evaluation, I estimate the patient to be at high of self harm in the current setting. - At this time, we recommend  1:1 Observation. This decision is based on my review of the chart including patient's history and current presentation, interview of the patient, mental status examination, and consideration of suicide risk including evaluating suicidal ideation, plan, intent, suicidal or self-harm behaviors, risk factors, and protective factors. This judgment is based on our ability to directly address suicide risk, implement suicide prevention strategies, and develop a safety plan while the patient is in the clinical setting. Please contact our team if there is a concern that risk level has changed.  Of note:due to the patient's history of conditional suicidal statements and observed manipulative behavior aimed at securing housing or psychiatric admission.  There is concern that the patient may engage in self injures behavior in an attempt to avoid discharge.  For safety patient is placed on a one-to-one observation until discharge  to ensure that he does not attempt to harm self to remain in the hospital.  CSSR Risk Category:C-SSRS RISK CATEGORY: High Risk  Suicide Risk Assessment: Patient has following modifiable risk factors for suicide: medication noncompliance and recent psychiatric hospitalization, which we are addressing by recommending close follow-up with outpatient as discussed with patient. Patient has following non-modifiable or demographic risk factors for suicide: male gender and psychiatric hospitalization Patient has the following protective factors against suicide: Access to outpatient mental health care  Thank you for this consult request. Recommendations have been communicated to the primary team.  We will sign off at this time.   Elveria VEAR Batter, NP       History of Present Illness  Relevant Aspects of Hospital ED Course:  Admitted on 11/24/2025for suicidal ideation. 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   Patient Report:  I just need housing  Ethan Schultze PA-C, Ethan Goodwin is a 32 y.o. male with history of HIV, secondary syphilis, schizophrenia.  Presents to ED complaining of suicidal ideation.  States has been suicidal for quite some time now. He denies homicidal ideation, hallucinations.  He denies medical complaints.    Psych ROS:  Depression: Initially denied but then stated yes after discussing that he would not be admitted Anxiety:  Initially denied but then stated yes after discussing that he would not be admitted  Mania (lifetime and current): Denies Psychosis: (lifetime and current): Patient endorses history of  Collateral information:  Declined any collateral  Review of Systems  Constitutional:  Negative for fever.  Respiratory:  Negative for cough.   Cardiovascular:  Negative for chest pain.  Neurological:  Negative for tremors.  Psychiatric/Behavioral:   Positive for depression, substance abuse and suicidal ideas. The patient is nervous/anxious.      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, anxiety and schizophrenia Current Psych Provider: None refuses to follow-up Home Meds (current): None refuses to take Previous Med Trials: Prozac , Risperdal , hydroxyzine , trazodone , Therapy: None refuses to go to therapy  Prior Psych Hospitalization: Multiple Prior Self Harm: States she has Prior Violence: Denies  Family Psych History: None disclosed Family Hx suicide: None disclosed  Social History:  Developmental Hx: None disclosed Educational Hx: [employer] Occupational Hx: Unemployed Legal Hx: Denies any current but has had previous legal charges and probation Living Situation: Homeless Spiritual Hx: No Access to weapons/lethal means: Denies  Substance History Alcohol: Denies Tobacco: Half pack per day Illicit drugs: 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 rate 16, SpO2 100%. There is no height or weight on file to calculate BMI.  Physical Exam Constitutional:      Appearance: Normal appearance.  Cardiovascular:     Rate and Rhythm: Normal rate.  Pulmonary:     Effort: No respiratory distress.  Neurological:     Mental Status: He is alert and oriented to person, place, and time.  Psychiatric:         Attention and Perception: Attention and perception normal.        Mood and Affect: Mood is anxious and depressed.        Speech: Speech normal.        Behavior: Behavior normal. Behavior is cooperative.        Thought Content: Thought content includes suicidal ideation. Thought content does not include suicidal plan.        Cognition and Memory: Cognition normal.        Judgment: Judgment is impulsive.     Mental Status Exam: General Appearance: Casual  Orientation:  Full (Time, Place, and Person)  Memory:  Immediate;   Fair Recent;   Fair Remote;   Fair  Concentration:  Concentration: Good and Attention Span: Good  Recall:  Good  Attention  Fair  Eye Contact:  Good  Speech:  Clear and Coherent and Normal Rate  Language:  Good  Volume:  Normal  Mood: Mood reported as depressed, however, patient suggests conditional distress with mood appearing euthymic to mildly irritable  Affect:  Congruent  Thought Process:  Coherent  Thought Content:  Logical  Suicidal Thoughts:  Yes.  without intent/plan  Homicidal Thoughts:  No  Judgement:  Fair  Insight:  Fair  Psychomotor Activity:  Normal  Akathisia:  No  Fund of Knowledge:  Good      Assets:  Communication Skills Desire for Improvement Leisure Time Physical Health Resilience  Cognition:  WNL  ADL's:  Intact  AIMS (if indicated):        Other History   These have been pulled in through the EMR, reviewed, and updated if appropriate.  Family History:  The patient's family history is not on file.  Medical History: Past Medical History:  Diagnosis Date   HIV disease (HCC) 10/20/2019   Schizophrenia (HCC)    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    Surgical History: Past Surgical History:  Procedure Laterality Date   FACIAL COSMETIC SURGERY       Medications:  No current facility-administered medications for this encounter.  Current Outpatient Medications:    bictegravir-emtricitabine -tenofovir  AF (BIKTARVY ) 50-200-25  MG TABS tablet, Take 1 tablet by mouth daily., Disp: 30 tablet, Rfl: 2   risperiDONE  (RISPERDAL ) 1 MG tablet, Take 1 tablet (1 mg total) by mouth 2 (two) times daily. (Patient taking differently: Take 1 mg by mouth at bedtime.), Disp: 60 tablet, Rfl: 0   traZODone  (DESYREL ) 50 MG tablet, Take 1 tablet (50 mg total) by mouth at bedtime as needed for sleep. (Patient taking differently: Take 50 mg by mouth at bedtime.), Disp: 30 tablet, Rfl: 0   doxycycline  (VIBRAMYCIN ) 100 MG capsule, Take 1 capsule (100 mg total) by mouth 2 (two) times daily. (Patient not taking: Reported on 08/24/2024), Disp: 14 capsule, Rfl: 0  Allergies: No Known Allergies  Elveria VEAR Batter, NP
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
## 2024-08-24 NOTE — ED Notes (Signed)
Psych NP at bedside assessing patient

## 2024-08-24 NOTE — Discharge Instructions (Signed)
 Follow up with resources provided earlier today in the ER by the behavioral health team.

## 2024-08-24 NOTE — ED Notes (Signed)
 Pt gives verbal consent for mse

## 2024-08-24 NOTE — ED Provider Notes (Signed)
 [place] EMERGENCY DEPARTMENT AT [facility] Provider Note   CSN: [id number] Arrival date & time: 08/23/24  2306     Patient presents with: Suicidal   Ethan Goodwin is a 33 y.o. male with history of HIV, secondary syphilis, schizophrenia.  Presents to ED complaining of suicidal ideation.  States has been suicidal for quite some time now. He denies homicidal ideation, hallucinations.  He denies medical complaints.  HPI     Prior to Admission medications   Medication Sig Start Date End Date Taking? Authorizing Provider  bictegravir-emtricitabine -tenofovir  AF (BIKTARVY ) 50-200-25 MG TABS tablet Take 1 tablet by mouth daily. 04/20/24   Waddell Alan PARAS, RPH-CPP  doxycycline  (VIBRAMYCIN ) 100 MG capsule Take 1 capsule (100 mg total) by mouth 2 (two) times daily. 08/22/24   Ruthell Lonni FALCON, PA-C  FLUoxetine  (PROZAC ) 10 MG capsule Take 1 capsule (10 mg total) by mouth daily. 03/27/24   Lenard Calin, MD  nicotine  (NICODERM CQ  - DOSED IN MG/24 HOURS) 21 mg/24hr patch Place 1 patch (21 mg total) onto the skin daily at 6 (six) AM. Patient not taking: Reported on 07/14/2024 03/27/24   Lenard Calin, MD  risperiDONE  (RISPERDAL ) 1 MG tablet Take 1 tablet (1 mg total) by mouth 2 (two) times daily. 03/26/24   Lenard Calin, MD  traZODone  (DESYREL ) 50 MG tablet Take 1 tablet (50 mg total) by mouth at bedtime as needed for sleep. Patient not taking: Reported on 07/14/2024 03/26/24   Lenard Calin, MD    Allergies: Patient has no known allergies.    Review of Systems  Psychiatric/Behavioral:  Positive for suicidal ideas.   All other systems reviewed and are negative.   Updated Vital Signs BP 119/81 (BP Location: Right Arm)   Pulse 93   Temp 97.8 F (36.6 C)   Resp 16   SpO2 100%   Physical Exam Vitals and nursing note reviewed.  Constitutional:      General: He is not in acute distress.    Appearance: He is well-developed.  HENT:     Head: Normocephalic and  atraumatic.  Eyes:     Conjunctiva/sclera: Conjunctivae normal.  Cardiovascular:     Rate and Rhythm: Normal rate and regular rhythm.     Heart sounds: No murmur heard. Pulmonary:     Effort: Pulmonary effort is normal. No respiratory distress.     Breath sounds: Normal breath sounds.  Abdominal:     Palpations: Abdomen is soft.     Tenderness: There is no abdominal tenderness.  Musculoskeletal:        General: No swelling.     Cervical back: Neck supple.  Skin:    General: Skin is warm and dry.     Capillary Refill: Capillary refill takes less than 2 seconds.  Neurological:     Mental Status: He is alert and oriented to person, place, and time. Mental status is at baseline.     Comments: Reassuring neurological examination  Psychiatric:        Mood and Affect: Mood normal.     Comments: Calm and cooperative     (all labs ordered are listed, but only abnormal results are displayed) Labs Reviewed  COMPREHENSIVE METABOLIC PANEL WITH GFR - Abnormal; Notable for the following components:      Result Value   Glucose, Bld 107 (*)    AST 43 (*)    ALT 48 (*)    All other components within normal limits  RAPID URINE DRUG SCREEN, HOSP PERFORMED -  Abnormal; Notable for the following components:   Amphetamines POSITIVE (*)    All other components within normal limits  ETHANOL  CBC    EKG: None  Radiology: No results found.  Procedures   Medications Ordered in the ED - No data to display   Medical Decision Making Amount and/or Complexity of Data Reviewed Labs: ordered. ECG/medicine tests: ordered.   33 year old male presents for evaluation of suicidal ideation.  On exam, HD stable.  Lung sounds good bilaterally, no hypoxia.  Afebrile and nontachycardic.  Abdomen soft and compressible.  Neurological examination at baseline.  CBC without leukocytosis or anemia.  Metabolic panel with glucose 107, AST ALT 43 and 48, alk phos WNL.  Anion gap 12.  No electrolyte  derangement.  Ethanol detectable.  UDS positive for vitamins.  EKG nonischemic.  At this time patient is medically cleared for TTS disposition.    Final diagnoses:  Encounter for psychiatric assessment    ED Discharge Orders     None          Ruthell Lonni JULIANNA DEVONNA 08/24/24 0446    Griselda Norris, MD 08/24/24 [phone]
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
## 2024-08-24 NOTE — ED Notes (Signed)
 AVS provided to and discussed with patient. Pt verbalizes understanding of discharge instructions and denies any questions or concerns at this time. Pt ambulated out of department independently with steady gait.

## 2024-08-24 NOTE — ED Notes (Signed)
 Pt is currently in the restroom giving a urine sample, and changing into the purple scrubs.

## 2024-08-24 NOTE — Discharge Instructions (Signed)
 Please use resources provided to seek for shelter.

## 2024-08-25 NOTE — ED Notes (Signed)
 The patient is A&OX4, ambulatory at d/c with independent steady gait, NAD. Pt verbalized understanding of d/c instructions and follow up care. Pt escorted out by [employer].

## 2024-08-30 NOTE — ED Notes (Signed)
 Pt stated he was feeling better and was leaving ED

## 2024-08-31 ENCOUNTER — Emergency Department (HOSPITAL_COMMUNITY)
Admission: EM | Admit: 2024-08-31 | Discharge: 2024-08-31 | Disposition: A | Discharge status: Home / Self Care | Attending: Emergency Medicine | Admitting: Emergency Medicine

## 2024-08-31 ENCOUNTER — Encounter (HOSPITAL_COMMUNITY): Payer: Self-pay

## 2024-08-31 ENCOUNTER — Other Ambulatory Visit: Payer: Self-pay

## 2024-08-31 DIAGNOSIS — L03119 Cellulitis of unspecified part of limb: Secondary | ICD-10-CM

## 2024-08-31 DIAGNOSIS — Z5900 Homelessness unspecified: Secondary | ICD-10-CM | POA: Insufficient documentation

## 2024-08-31 DIAGNOSIS — L03115 Cellulitis of right lower limb: Secondary | ICD-10-CM | POA: Insufficient documentation

## 2024-08-31 DIAGNOSIS — Z760 Encounter for issue of repeat prescription: Secondary | ICD-10-CM | POA: Diagnosis not present

## 2024-08-31 DIAGNOSIS — Z21 Asymptomatic human immunodeficiency virus [HIV] infection status: Secondary | ICD-10-CM | POA: Insufficient documentation

## 2024-08-31 MED ORDER — DOXYCYCLINE HYCLATE 100 MG PO TABS
100.0000 mg | ORAL_TABLET | Freq: Once | ORAL | Status: AC
  Administered 2024-08-31: 100 mg via ORAL
  Filled 2024-08-31: qty 1

## 2024-08-31 MED ORDER — DOXYCYCLINE HYCLATE 100 MG PO CAPS: 100.0000 mg | ORAL_CAPSULE | Freq: Two times a day (BID) | ORAL | 0 refills | Status: AC
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
## 2024-08-31 NOTE — ED Provider Notes (Signed)
 [place] [facility] AT [facility] Provider Note   CSN: [id number] Arrival date & time: 08/31/24  9782     Patient presents with: Homeless   Ethan Goodwin is a 33 y.o. male.  Patient with history of homelessness, HIV, abscess of right ankle with cellulitis presents to the emergency department via EMS.  According to EMS he reported that he was wanting to go to the hospital because he was homeless.  He has a history of IV drug use, malingering.  He was seen at an outside emergency department twice over the past few days due to cellulitis of his right ankle.  He was prescribed doxycycline .  He states he may have taken 2 doses and is requesting a refill for the rest.  He denies other new complaints at this time.   HPI     Prior to Admission medications   Medication Sig Start Date End Date Taking? Authorizing Provider  doxycycline  (VIBRAMYCIN ) 100 MG capsule Take 1 capsule (100 mg total) by mouth 2 (two) times daily. 08/31/24  Yes Logan Ubaldo NOVAK, PA-C  bictegravir-emtricitabine -tenofovir  AF (BIKTARVY ) 50-200-25 MG TABS tablet Take 1 tablet by mouth daily. Patient not taking: Reported on 08/24/2024 04/20/24   Waddell Alan PARAS, RPH-CPP  risperiDONE  (RISPERDAL ) 1 MG tablet Take 1 tablet (1 mg total) by mouth 2 (two) times daily. Patient not taking: Reported on 08/24/2024 03/26/24   Lenard Calin, MD  traZODone  (DESYREL ) 50 MG tablet Take 1 tablet (50 mg total) by mouth at bedtime as needed for sleep. Patient not taking: Reported on 08/24/2024 03/26/24   Lenard Calin, MD    Allergies: Patient has no known allergies.    Review of Systems  Updated Vital Signs BP 123/78 (BP Location: Right Arm)   Pulse 89   Temp 97.7 F (36.5 C) (Oral)   Resp 16   Ht 5' 7 (1.702 m)   Wt 64.9 kg   SpO2 98%   BMI 22.40 kg/m   Physical Exam Vitals and nursing note reviewed.  Constitutional:      Comments: Patient lying prone, resting comfortably upon my arrival  HENT:      Head: Normocephalic and atraumatic.  Eyes:     Pupils: Pupils are equal, round, and reactive to light.  Pulmonary:     Effort: Pulmonary effort is normal. No respiratory distress.  Musculoskeletal:        General: No signs of injury.     Cervical back: Normal range of motion.  Skin:    General: Skin is dry.     Comments: Small erythematous area on the medial portion of the right ankle.  No fluctuance appreciated.  No drainage.  Neurological:     Mental Status: He is alert.  Psychiatric:        Speech: Speech normal.        Behavior: Behavior normal.     (all labs ordered are listed, but only abnormal results are displayed) Labs Reviewed - No data to display  EKG: None  Radiology: No results found.   Procedures   Medications Ordered in the ED  doxycycline  (VIBRA -TABS) tablet 100 mg (has no administration in time range)                                    Medical Decision Making Risk Prescription drug management.   Patient presents with a chief complaint of needing a refill of antibiotics  for the cellulitis of his right ankle.  Physical examination reveals an area that appears consistent with cellulitis.  He was previously prescribed doxycycline .  It does not appear based on history and based on his recollection that the area has worsened in severity since starting the doxycycline  a few days ago.  I see no indication at this time for lab work or imaging.  The patient is not septic at this time.  Vitals are reassuring.SABRAHe has no fever.   I suspect patient's presentation this evening is due more to his homelessness than due to concerns about his ankle. He did not endorse any concerns about his ankle to EMS or to the triage nurse.  Plan to discharge patient with a course of doxycycline . No indication for further emergent evaluation or admission.      Final diagnoses:  Cellulitis of lower extremity, unspecified laterality  Homeless    ED Discharge Orders           Ordered    doxycycline  (VIBRAMYCIN ) 100 MG capsule  2 times daily        08/31/24 0300               Logan Ubaldo KATHEE DEVONNA 08/31/24 0301    Palumbo, April, MD 08/31/24 9691

## 2024-08-31 NOTE — ED Notes (Signed)
 All triage was completed by MYSELF, Ronal Dawn, RN

## 2024-08-31 NOTE — Discharge Instructions (Addendum)
 Take the prescribed medication until complete. Return to the emergency department if you develop any life threatening symptoms.
